# Patient Record
Sex: Female | Born: 1940 | Race: White | Hispanic: No | Marital: Married | Smoking: Former smoker
Health system: Southern US, Community
[De-identification: ages and names within clinical notes are randomized; demographics above are authoritative.]

## PROBLEM LIST (undated history)

## (undated) DIAGNOSIS — H409 Unspecified glaucoma: Secondary | ICD-10-CM

## (undated) DIAGNOSIS — K589 Irritable bowel syndrome without diarrhea: Secondary | ICD-10-CM

## (undated) DIAGNOSIS — K635 Polyp of colon: Secondary | ICD-10-CM

## (undated) DIAGNOSIS — N6001 Solitary cyst of right breast: Secondary | ICD-10-CM

## (undated) DIAGNOSIS — M810 Age-related osteoporosis without current pathological fracture: Secondary | ICD-10-CM

## (undated) DIAGNOSIS — R569 Unspecified convulsions: Secondary | ICD-10-CM

## (undated) DIAGNOSIS — I209 Angina pectoris, unspecified: Secondary | ICD-10-CM

## (undated) DIAGNOSIS — Z9889 Other specified postprocedural states: Secondary | ICD-10-CM

## (undated) DIAGNOSIS — R413 Other amnesia: Secondary | ICD-10-CM

## (undated) DIAGNOSIS — R112 Nausea with vomiting, unspecified: Secondary | ICD-10-CM

## (undated) DIAGNOSIS — K219 Gastro-esophageal reflux disease without esophagitis: Secondary | ICD-10-CM

## (undated) DIAGNOSIS — E7800 Pure hypercholesterolemia, unspecified: Secondary | ICD-10-CM

## (undated) DIAGNOSIS — Z1379 Encounter for other screening for genetic and chromosomal anomalies: Principal | ICD-10-CM

## (undated) DIAGNOSIS — Z9109 Other allergy status, other than to drugs and biological substances: Secondary | ICD-10-CM

## (undated) DIAGNOSIS — I10 Essential (primary) hypertension: Secondary | ICD-10-CM

## (undated) DIAGNOSIS — H269 Unspecified cataract: Secondary | ICD-10-CM

## (undated) HISTORY — PX: ABDOMINAL HYSTERECTOMY: SHX81

## (undated) HISTORY — PX: ELBOW FRACTURE SURGERY: SHX616

## (undated) HISTORY — PX: EYE SURGERY: SHX253

## (undated) HISTORY — DX: Encounter for other screening for genetic and chromosomal anomalies: Z13.79

## (undated) HISTORY — PX: CARDIAC CATHETERIZATION: SHX172

## (undated) HISTORY — DX: Other amnesia: R41.3

## (undated) HISTORY — PX: WRIST ARTHROSCOPY: SUR100

## (undated) HISTORY — PX: COLONOSCOPY: SHX174

## (undated) HISTORY — PX: TUBAL LIGATION: SHX77

## (undated) HISTORY — DX: Age-related osteoporosis without current pathological fracture: M81.0

## (undated) HISTORY — PX: CATARACT EXTRACTION, BILATERAL: SHX1313

## (undated) HISTORY — PX: BREAST SURGERY: SHX581

---

## 2007-11-19 ENCOUNTER — Encounter: Admission: RE | Admit: 2007-11-19 | Discharge: 2007-11-19 | Payer: Self-pay | Admitting: Internal Medicine

## 2007-11-23 ENCOUNTER — Ambulatory Visit (HOSPITAL_COMMUNITY): Admission: RE | Admit: 2007-11-23 | Discharge: 2007-11-23 | Payer: Self-pay | Admitting: Internal Medicine

## 2007-12-28 ENCOUNTER — Encounter: Admission: RE | Admit: 2007-12-28 | Discharge: 2007-12-28 | Payer: Self-pay | Admitting: Cardiovascular Disease

## 2008-01-01 ENCOUNTER — Ambulatory Visit (HOSPITAL_COMMUNITY): Admission: RE | Admit: 2008-01-01 | Discharge: 2008-01-01 | Payer: Self-pay | Admitting: Cardiovascular Disease

## 2008-12-17 ENCOUNTER — Encounter: Admission: RE | Admit: 2008-12-17 | Discharge: 2008-12-17 | Payer: Self-pay | Admitting: Internal Medicine

## 2010-04-12 ENCOUNTER — Encounter: Payer: Self-pay | Admitting: Internal Medicine

## 2010-04-12 ENCOUNTER — Encounter: Payer: Self-pay | Admitting: Cardiovascular Disease

## 2010-08-03 NOTE — Cardiovascular Report (Signed)
NAMEJAQUILLA, WOODROOF                ACCOUNT NO.:  [id number]   MEDICAL RECORD NO.:  [id number]         PATIENT TYPE:  COIB   LOCATION:                               FACILITY:  [facility]   PHYSICIAN:  Nanetta Batty, M.D.   DATE OF BIRTH:  July 15, 1940   DATE OF PROCEDURE:  DATE OF DISCHARGE:  01/01/2008                            CARDIAC CATHETERIZATION   Ms. Gilchrest is a 70 year old married white female, mother of 1,  grandmother of 2 grandchildren who had relocated from [place]  last year to [place] to be close to her family.  She is referred for  evaluation of chest pain.   Risk factor profile is positive for hypertension and dyslipidemia, as  well a strong family history.  She complained of pain, lasting  approximately once a month, radiating to her jaw and arm.  The patient  has had multiple stress tests in [place], all which were negative.  A  CT angiogram suggested heavy calcium in her distal left main/LAD and  circumflex.  She presents here for diagnostic coronary arteriography to  define her anatomy and rule out ischemic etiology.   PROCEDURE DESCRIPTION:  The patient was brought to the second floor of  [facility] in the postabsorptive state.  She was  premedicated with p.o. Valium.  Her right groin was prepped and shaved  in the usual sterile fashion.  A 1% Xylocaine was used for local  anesthesia.  A 6-French sheath was inserted into the right femoral  artery using standard Seldinger technique.  A 6-French right and left  Judkins diagnostic catheters, as well as 6-French pigtail catheter were  used for selective coronary angiography, left ventriculography, and  distal abdominal aortography.  Visipaque dye was used for the entirety  of the case.  Retrograde aortic and ventricular and pullback pressures  were recorded.   HEMODYNAMICS:  1. Aortic systolic pressure 167, diastolic pressure 66.  2. Left ventricular systolic pressure 157, end-diastolic  pressure 20.   Selective coronary angiography:  1. Left main normal.  2. LAD; LAD had a segmental 50-60% stenosis in its proximal portion.      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  The circumflex had a segmental 50% proximal stenosis.  The      ramus branch was large and widely patent.  3. Right coronary artery; this was dominant with minor irregularities      in the midportion.  4. Ventriculography; RAO left ventriculogram performed using 25 mL of      Visipaque dye at 12 mL per second.  The overall LVEF was estimated      greater than 60% without focal wall motion abnormalities.  5. Distal abdominal aortography; performed using 25 mL of Visipaque      dye at 20 mL per second.  The renal arteries widely patent.      Inferior abdominal aorta and iliac bifurcation appear free of      significant atherosclerotic changes.   IMPRESSION:  Ms. Rosenau has moderate to noncritical  coronary artery  disease.  I think that her CT angio overestimated the degree of coronary  atherosclerosis.  Continue medical therapy will be recommended.   Sheath was removed and pressures on the groin to achieve hemostasis.  The patient left the lab in stable condition.  She will remain recumbent  for 5 hours, after which time, she will be discharged home.  She will  see me back in the office in approximately 1 or 2 weeks in followup.      Nanetta Batty, M.D.  Electronically Signed     JB/MEDQ  D:  01/01/2008  T:  01/02/2008  Job:  [id number]   cc:   [facility]  [place] R. Renae Gloss, M.D.

## 2010-08-07 ENCOUNTER — Emergency Department (HOSPITAL_BASED_OUTPATIENT_CLINIC_OR_DEPARTMENT_OTHER)
Admission: EM | Admit: 2010-08-07 | Discharge: 2010-08-07 | Disposition: A | Discharge status: Home / Self Care | Payer: MEDICARE | Attending: Emergency Medicine | Admitting: Emergency Medicine

## 2010-08-07 ENCOUNTER — Emergency Department (INDEPENDENT_AMBULATORY_CARE_PROVIDER_SITE_OTHER): Payer: MEDICARE

## 2010-08-07 DIAGNOSIS — Z79899 Other long term (current) drug therapy: Secondary | ICD-10-CM | POA: Insufficient documentation

## 2010-08-07 DIAGNOSIS — S52023A Displaced fracture of olecranon process without intraarticular extension of unspecified ulna, initial encounter for closed fracture: Secondary | ICD-10-CM | POA: Insufficient documentation

## 2010-08-07 DIAGNOSIS — I10 Essential (primary) hypertension: Secondary | ICD-10-CM | POA: Insufficient documentation

## 2010-08-07 DIAGNOSIS — Y92009 Unspecified place in unspecified non-institutional (private) residence as the place of occurrence of the external cause: Secondary | ICD-10-CM | POA: Insufficient documentation

## 2010-08-07 DIAGNOSIS — W010XXA Fall on same level from slipping, tripping and stumbling without subsequent striking against object, initial encounter: Secondary | ICD-10-CM

## 2010-08-10 ENCOUNTER — Other Ambulatory Visit (HOSPITAL_COMMUNITY): Payer: Self-pay | Admitting: Orthopaedic Surgery

## 2010-08-10 ENCOUNTER — Ambulatory Visit (HOSPITAL_COMMUNITY)
Admission: RE | Admit: 2010-08-10 | Discharge: 2010-08-10 | Disposition: A | Discharge status: Home / Self Care | Payer: BC Managed Care – PPO | Source: Ambulatory Visit | Attending: Orthopaedic Surgery | Admitting: Orthopaedic Surgery

## 2010-08-10 ENCOUNTER — Encounter (HOSPITAL_COMMUNITY)
Admission: RE | Admit: 2010-08-10 | Discharge: 2010-08-10 | Disposition: A | Discharge status: Home / Self Care | Payer: BC Managed Care – PPO | Source: Ambulatory Visit | Attending: Orthopaedic Surgery | Admitting: Orthopaedic Surgery

## 2010-08-10 DIAGNOSIS — Z01818 Encounter for other preprocedural examination: Secondary | ICD-10-CM | POA: Insufficient documentation

## 2010-08-10 DIAGNOSIS — R079 Chest pain, unspecified: Secondary | ICD-10-CM | POA: Insufficient documentation

## 2010-08-10 DIAGNOSIS — S42402A Unspecified fracture of lower end of left humerus, initial encounter for closed fracture: Secondary | ICD-10-CM

## 2010-08-10 DIAGNOSIS — Z01812 Encounter for preprocedural laboratory examination: Secondary | ICD-10-CM | POA: Insufficient documentation

## 2010-08-10 LAB — BASIC METABOLIC PANEL
BUN: 16 mg/dL (ref 6–23)
CO2: 28 mEq/L (ref 19–32)
Calcium: 9.3 mg/dL (ref 8.4–10.5)
Chloride: 104 mEq/L (ref 96–112)
Creatinine, Ser: 0.74 mg/dL (ref 0.4–1.2)
GFR calc Af Amer: >60 mL/min (ref >60)
GFR calc non Af Amer: >60 mL/min (ref >60)
Glucose, Bld: 99 mg/dL (ref 70–99)
Potassium: 4.4 mEq/L (ref 3.5–5.1)
Sodium: 142 mEq/L (ref 135–145)

## 2010-08-10 LAB — CBC
HCT: 38.3 % (ref 36.0–46.0)
Hemoglobin: 12.8 g/dL (ref 12.0–15.0)
MCH: 32.2 pg (ref 26.0–34.0)
MCHC: 33.4 g/dL (ref 30.0–36.0)
MCV: 96.2 fL (ref 78.0–100.0)
Platelets: 255 10*3/uL (ref 150–400)
RBC: 3.98 MIL/uL (ref 3.87–5.11)
RDW: 11.8 % (ref 11.5–15.5)
WBC: 6.3 10*3/uL (ref 4.0–10.5)

## 2010-08-10 LAB — URINALYSIS, ROUTINE W REFLEX MICROSCOPIC
Bilirubin Urine: NEGATIVE
Glucose, UA: NEGATIVE mg/dL
Hgb urine dipstick: NEGATIVE
Ketones, ur: NEGATIVE mg/dL
Nitrite: NEGATIVE
Protein, ur: NEGATIVE mg/dL
Specific Gravity, Urine: 1.025 (ref 1.005–1.030)
Urobilinogen, UA: 0.2 mg/dL (ref 0.0–1.0)
pH: 6 (ref 5.0–8.0)

## 2010-08-10 LAB — SURGICAL PCR SCREEN
MRSA, PCR: NEGATIVE
Staphylococcus aureus: POSITIVE — AB

## 2010-08-11 ENCOUNTER — Ambulatory Visit (HOSPITAL_COMMUNITY): Payer: BC Managed Care – PPO

## 2010-08-11 ENCOUNTER — Observation Stay (HOSPITAL_COMMUNITY)
Admission: RE | Admit: 2010-08-11 | Discharge: 2010-08-12 | Disposition: A | Discharge status: Home / Self Care | Payer: BC Managed Care – PPO | Source: Ambulatory Visit | Attending: Orthopaedic Surgery | Admitting: Orthopaedic Surgery

## 2010-08-11 DIAGNOSIS — W19XXXA Unspecified fall, initial encounter: Secondary | ICD-10-CM | POA: Insufficient documentation

## 2010-08-11 DIAGNOSIS — Z01812 Encounter for preprocedural laboratory examination: Secondary | ICD-10-CM | POA: Insufficient documentation

## 2010-08-11 DIAGNOSIS — I10 Essential (primary) hypertension: Secondary | ICD-10-CM | POA: Insufficient documentation

## 2010-08-11 DIAGNOSIS — H409 Unspecified glaucoma: Secondary | ICD-10-CM | POA: Insufficient documentation

## 2010-08-11 DIAGNOSIS — Z01810 Encounter for preprocedural cardiovascular examination: Secondary | ICD-10-CM | POA: Insufficient documentation

## 2010-08-11 DIAGNOSIS — S52123A Displaced fracture of head of unspecified radius, initial encounter for closed fracture: Secondary | ICD-10-CM | POA: Insufficient documentation

## 2010-08-11 DIAGNOSIS — S52023A Displaced fracture of olecranon process without intraarticular extension of unspecified ulna, initial encounter for closed fracture: Principal | ICD-10-CM | POA: Insufficient documentation

## 2010-08-11 DIAGNOSIS — Z01818 Encounter for other preprocedural examination: Secondary | ICD-10-CM | POA: Insufficient documentation

## 2010-08-19 NOTE — Op Note (Signed)
NAME:  Sandra, Wheeler                ACCOUNT NO.:  [id number]  MEDICAL RECORD NO.:  [id number]           PATIENT TYPE:  O  LOCATION:  5001                         FACILITY:  [facility]  PHYSICIAN:  Mark C. Ophelia Charter, M.D.    DATE OF BIRTH:  Jul 31, 1940  DATE OF PROCEDURE:  08/11/2010 DATE OF DISCHARGE:                              OPERATIVE REPORT   PREOPERATIVE DIAGNOSIS:  Fracture dislocation, left elbow with comminuted olecranon fracture.  POSTOPERATIVE DIAGNOSIS:  Fracture dislocation, left elbow with comminuted olecranon fracture.  PROCEDURE:  Open reduction and internal fixation, left olecranon with Acumed plate, Steinmann pins, and tension band wiring.  SURGEON:  Mark C. Ophelia Charter, MD  TOURNIQUET TIME:  2 hours 6 minutes x250.  ANESTHESIA:  Preoperative regional block plus IV sedation.  This 70 year old female few days ago had a fall, landed on olecranon with comminuted fracture dislocation with the coracoid fracture anteriorly and a fracture of the radial head without significant angulation or displacement.  Anterior coracoid fragment was just few millimeters.  Olecranon was displaced 2 cm.  PROCEDURE:  After induction of regional block IV sedation, proximal arm tourniquet with split off, standard prep and draping was performed with impervious stockinette, Coban, prepping with DuraPrep from the wrist to the tourniquet and folded towel, towel clip, and extremity sheets and drapes.  Arm was wrapped with an Esmarch after a time-out procedure was completed and a posterior incision was made with a bolster underneath the upper arm.  Fracture fragments were identified.  Joint was washed out.  Olecranon fragment was reduced, held with K-wires from distal to proximal, and checked under fluoroscopy with adequate position.  Small Acumed plate was selected.  The home run screw was placed after distal nonlocking screw was selected.  Plate was advanced, tightened down fragment, home run screw  inserted and then the 4 clusters of small locking screws were inserted.  C-arm was brought in as the elbow was being flexed up to check position and alignment.  The olecranon fragment broke loose from the home run screw and was displaced and the elbow subluxed with the dislocation of the humerus anterior from the ulna. Distraction and reduction was performed.  Plate was removed.  Plate with a longer flange was selected.  Again, reduction was performed.  Towel clips were used on the side.  There continued to be problems with the elbow subluxing anteriorly and finally a series of 4 K-wires were placed holding everything together.  K-wires were placed from proximal to distal across the fracture and then the home run screws and regular locking and nonlocking screws were placed distally in the plate into the ulna.  A 20-gauge figure-of-eight stainless steel wire was hooked through a hole in the plate and then hooked around the proximal K-wires for tension band fact.  This was checked under fluoroscopy and finally there was good position and alignment and reduction.  Additional screws were placed.  The 4 cluster of proximal screws with the guide had only 2 screws placed since this was distal to the fracture and the olecranon was comminuted.  The proximal piece was soft and the  tension band with pins plus 2 proximal locking screws held it reduced.  AP and lateral spot films were taken, fluoroscopy to make sure everything looked good. The big C-arm was brought in.  Initially, a small C-arm was used and the big C-arm gave better visualization.  After irrigation, layered closure with 2-0 Vicryl and subcutaneous tissue, skin staple closure.  Xeroform 4x4s, Webril, and then application of a fiberglass long arm cast was performed and spot fluoro pictures were taken again to confirm that the elbow was reduced and was in satisfactory position with reduction of the elbow.  Elbow was at 90 degrees, forearm  in neutral, and radial head was reduced.  The patient tolerated the procedure well and was transferred to the recovery room in stable condition.  Hand dressing was applied to the fingers with Webril 2-inch and 2-inch Ace wrap to help with finger swelling.  Time-out procedure was completed at the end of the case. Surgery was as planned.     Mark C. Ophelia Charter, M.D.     MCY/MEDQ  D:  08/11/2010  T:  08/12/2010  Job:  [id number]  Electronically Signed by Annell Greening M.D. on 08/19/2010 95:62:13 PM

## 2016-03-28 ENCOUNTER — Other Ambulatory Visit: Payer: Self-pay | Admitting: Obstetrics & Gynecology

## 2016-03-30 NOTE — Patient Instructions (Addendum)
Your procedure is scheduled on:  Friday, Jan. 19, 2018  Enter through the [employer] [facility] at:  7:00 AM  Pick up the phone at the desk and dial [phone].  Call this number if you have problems the morning of surgery: [phone].  Remember: Do NOT eat food or drink after:  Midnight Thursday, Jan. 18, 2018  Take these medicines the morning of surgery with a SIP OF WATER:  Cetirizine, Viberzi  Stop ALL herbal medications, Aspirin, and Flaxseed at this time  Do NOT smoke the day of surgery.  Do NOT wear jewelry (body piercing), metal hair clips/bobby pins, make-up, or nail polish. Do NOT wear lotions, powders, or perfumes.  You may wear deodorant. Do NOT shave for 48 hours prior to surgery. Do NOT bring valuables to the hospital. Contacts, dentures, or bridgework may not be worn into surgery.  Leave suitcase in car.  After surgery it may be brought to your room.  For patients admitted to the hospital, checkout time is 11:00 AM the day of discharge.  Bring a copy of your healthcare power of attorney and living will documents.

## 2016-03-31 ENCOUNTER — Ambulatory Visit (HOSPITAL_COMMUNITY)
Admission: RE | Admit: 2016-03-31 | Discharge: 2016-03-31 | Disposition: A | Discharge status: Home / Self Care | Payer: Medicare Other | Source: Ambulatory Visit | Attending: Obstetrics & Gynecology | Admitting: Obstetrics & Gynecology

## 2016-03-31 ENCOUNTER — Encounter (HOSPITAL_COMMUNITY): Payer: Self-pay

## 2016-03-31 ENCOUNTER — Encounter (HOSPITAL_COMMUNITY)
Admission: RE | Admit: 2016-03-31 | Discharge: 2016-03-31 | Disposition: A | Discharge status: Home / Self Care | Payer: Medicare Other | Source: Ambulatory Visit | Attending: Obstetrics & Gynecology | Admitting: Obstetrics & Gynecology

## 2016-03-31 ENCOUNTER — Other Ambulatory Visit: Payer: Self-pay

## 2016-03-31 DIAGNOSIS — J8410 Pulmonary fibrosis, unspecified: Secondary | ICD-10-CM | POA: Diagnosis not present

## 2016-03-31 DIAGNOSIS — I10 Essential (primary) hypertension: Secondary | ICD-10-CM | POA: Insufficient documentation

## 2016-03-31 DIAGNOSIS — Z419 Encounter for procedure for purposes other than remedying health state, unspecified: Secondary | ICD-10-CM

## 2016-03-31 HISTORY — DX: Angina pectoris, unspecified: I20.9

## 2016-03-31 HISTORY — DX: Essential (primary) hypertension: I10

## 2016-03-31 HISTORY — DX: Solitary cyst of right breast: N60.01

## 2016-03-31 HISTORY — DX: Irritable bowel syndrome, unspecified: K58.9

## 2016-03-31 HISTORY — DX: Unspecified glaucoma: H40.9

## 2016-03-31 HISTORY — DX: Other allergy status, other than to drugs and biological substances: Z91.09

## 2016-03-31 HISTORY — DX: Unspecified cataract: H26.9

## 2016-03-31 HISTORY — DX: Polyp of colon: K63.5

## 2016-03-31 HISTORY — DX: Pure hypercholesterolemia, unspecified: E78.00

## 2016-03-31 HISTORY — DX: Gastro-esophageal reflux disease without esophagitis: K21.9

## 2016-03-31 HISTORY — DX: Unspecified convulsions: R56.9

## 2016-03-31 HISTORY — PX: DENTAL SURGERY: SHX609

## 2016-03-31 LAB — CBC
HCT: 38.5 % (ref 36.0–46.0)
Hemoglobin: 13.1 g/dL (ref 12.0–15.0)
MCH: 32.6 pg (ref 26.0–34.0)
MCHC: 34 g/dL (ref 30.0–36.0)
MCV: 95.8 fL (ref 78.0–100.0)
Platelets: 276 10*3/uL (ref 150–400)
RBC: 4.02 MIL/uL (ref 3.87–5.11)
RDW: 12.2 % (ref 11.5–15.5)
WBC: 4.4 10*3/uL (ref 4.0–10.5)

## 2016-03-31 LAB — COMPREHENSIVE METABOLIC PANEL
ALT: 22 U/L (ref 14–54)
AST: 22 U/L (ref 15–41)
Albumin: 4 g/dL (ref 3.5–5.0)
Alkaline Phosphatase: 51 U/L (ref 38–126)
Anion gap: 5 (ref 5–15)
BUN: 19 mg/dL (ref 6–20)
CO2: 28 mmol/L (ref 22–32)
Calcium: 8.7 mg/dL — ABNORMAL LOW (ref 8.9–10.3)
Chloride: 107 mmol/L (ref 101–111)
Creatinine, Ser: 0.59 mg/dL (ref 0.44–1.00)
GFR calc Af Amer: >60 mL/min (ref >60)
GFR calc non Af Amer: >60 mL/min (ref >60)
Glucose, Bld: 95 mg/dL (ref 65–99)
Potassium: 4.2 mmol/L (ref 3.5–5.1)
Sodium: 140 mmol/L (ref 135–145)
Total Bilirubin: 0.5 mg/dL (ref 0.3–1.2)
Total Protein: 7 g/dL (ref 6.5–8.1)

## 2016-03-31 LAB — ABO/RH: ABO/RH(D): O POS

## 2016-03-31 LAB — TYPE AND SCREEN
ABO/RH(D): O POS
Antibody Screen: NEGATIVE

## 2016-03-31 NOTE — Pre-Procedure Instructions (Signed)
Dr. Smith Robert reviewed EKG no new orders received at this time.

## 2016-04-08 ENCOUNTER — Ambulatory Visit (HOSPITAL_COMMUNITY)
Admission: RE | Admit: 2016-04-08 | Discharge: 2016-04-09 | Disposition: A | Discharge status: Home / Self Care | Payer: Medicare Other | Source: Ambulatory Visit | Attending: Obstetrics & Gynecology | Admitting: Obstetrics & Gynecology

## 2016-04-08 ENCOUNTER — Ambulatory Visit (HOSPITAL_COMMUNITY): Payer: Medicare Other | Admitting: Certified Registered Nurse Anesthetist

## 2016-04-08 ENCOUNTER — Encounter (HOSPITAL_COMMUNITY): Payer: Self-pay

## 2016-04-08 ENCOUNTER — Encounter (HOSPITAL_COMMUNITY)
Admission: RE | Disposition: A | Discharge status: Home / Self Care | Payer: Self-pay | Source: Ambulatory Visit | Attending: Obstetrics & Gynecology

## 2016-04-08 DIAGNOSIS — N813 Complete uterovaginal prolapse: Secondary | ICD-10-CM | POA: Diagnosis not present

## 2016-04-08 DIAGNOSIS — I10 Essential (primary) hypertension: Secondary | ICD-10-CM | POA: Diagnosis not present

## 2016-04-08 DIAGNOSIS — N393 Stress incontinence (female) (male): Secondary | ICD-10-CM | POA: Diagnosis present

## 2016-04-08 DIAGNOSIS — K219 Gastro-esophageal reflux disease without esophagitis: Secondary | ICD-10-CM | POA: Insufficient documentation

## 2016-04-08 DIAGNOSIS — H409 Unspecified glaucoma: Secondary | ICD-10-CM | POA: Insufficient documentation

## 2016-04-08 DIAGNOSIS — E7800 Pure hypercholesterolemia, unspecified: Secondary | ICD-10-CM | POA: Diagnosis not present

## 2016-04-08 DIAGNOSIS — Z7982 Long term (current) use of aspirin: Secondary | ICD-10-CM | POA: Diagnosis not present

## 2016-04-08 DIAGNOSIS — D259 Leiomyoma of uterus, unspecified: Secondary | ICD-10-CM | POA: Diagnosis not present

## 2016-04-08 DIAGNOSIS — Z9889 Other specified postprocedural states: Secondary | ICD-10-CM

## 2016-04-08 HISTORY — PX: CYSTOSCOPY: SHX5120

## 2016-04-08 HISTORY — PX: BLADDER SUSPENSION: SHX72

## 2016-04-08 HISTORY — DX: Other specified postprocedural states: Z98.890

## 2016-04-08 HISTORY — DX: Other specified postprocedural states: R11.2

## 2016-04-08 HISTORY — PX: ROBOTIC ASSISTED TOTAL HYSTERECTOMY WITH BILATERAL SALPINGO OOPHERECTOMY: SHX6086

## 2016-04-08 SURGERY — ROBOTIC ASSISTED TOTAL HYSTERECTOMY WITH BILATERAL SALPINGO OOPHORECTOMY
Anesthesia: General | Site: Vagina

## 2016-04-08 MED ORDER — PROPOFOL 10 MG/ML IV BOLUS
INTRAVENOUS | Status: DC | PRN
  Administered 2016-04-08: 100 mg via INTRAVENOUS

## 2016-04-08 MED ORDER — SUGAMMADEX SODIUM 200 MG/2ML IV SOLN
INTRAVENOUS | Status: DC | PRN
  Administered 2016-04-08: 133 mg via INTRAVENOUS

## 2016-04-08 MED ORDER — DIPHENHYDRAMINE HCL 50 MG/ML IJ SOLN
12.5000 mg | Freq: Once | INTRAMUSCULAR | Status: AC
  Administered 2016-04-08: 12.5 mg via INTRAVENOUS

## 2016-04-08 MED ORDER — ROPIVACAINE HCL 5 MG/ML IJ SOLN
INTRAMUSCULAR | Status: AC
  Filled 2016-04-08: qty 30

## 2016-04-08 MED ORDER — SCOPOLAMINE 1 MG/3DAYS TD PT72: 1.0000 | MEDICATED_PATCH | Freq: Once | TRANSDERMAL | Status: DC

## 2016-04-08 MED ORDER — FENTANYL CITRATE (PF) 100 MCG/2ML IJ SOLN: 25.0000 ug | INTRAMUSCULAR | Status: DC | PRN

## 2016-04-08 MED ORDER — ONDANSETRON HCL 4 MG/2ML IJ SOLN
INTRAMUSCULAR | Status: AC
  Filled 2016-04-08: qty 2

## 2016-04-08 MED ORDER — LIDOCAINE HCL (CARDIAC) 20 MG/ML IV SOLN
INTRAVENOUS | Status: AC
  Filled 2016-04-08: qty 5

## 2016-04-08 MED ORDER — LOSARTAN POTASSIUM 50 MG PO TABS
50.0000 mg | ORAL_TABLET | Freq: Every evening | ORAL | Status: DC
  Administered 2016-04-08: 50 mg via ORAL
  Filled 2016-04-08: qty 1

## 2016-04-08 MED ORDER — ALPRAZOLAM 0.25 MG PO TABS
0.1250 mg | ORAL_TABLET | Freq: Every evening | ORAL | Status: DC | PRN
  Administered 2016-04-08: 0.125 mg via ORAL
  Filled 2016-04-08 (×2): qty 1

## 2016-04-08 MED ORDER — ROCURONIUM BROMIDE 100 MG/10ML IV SOLN
INTRAVENOUS | Status: DC | PRN
  Administered 2016-04-08: 10 mg via INTRAVENOUS
  Administered 2016-04-08: 40 mg via INTRAVENOUS
  Administered 2016-04-08: 10 mg via INTRAVENOUS

## 2016-04-08 MED ORDER — STERILE WATER FOR IRRIGATION IR SOLN
Status: DC | PRN
  Administered 2016-04-08: 1000 mL via INTRAVESICAL

## 2016-04-08 MED ORDER — PRAVASTATIN SODIUM 20 MG PO TABS
20.0000 mg | ORAL_TABLET | Freq: Every evening | ORAL | Status: DC
  Administered 2016-04-08: 20 mg via ORAL
  Filled 2016-04-08: qty 1

## 2016-04-08 MED ORDER — ROCURONIUM BROMIDE 100 MG/10ML IV SOLN
INTRAVENOUS | Status: AC
  Filled 2016-04-08: qty 1

## 2016-04-08 MED ORDER — MIDAZOLAM HCL 2 MG/2ML IJ SOLN
INTRAMUSCULAR | Status: DC | PRN
  Administered 2016-04-08: 1 mg via INTRAVENOUS

## 2016-04-08 MED ORDER — CEFAZOLIN SODIUM-DEXTROSE 2-4 GM/100ML-% IV SOLN
2.0000 g | INTRAVENOUS | Status: AC
  Administered 2016-04-08: 2 g via INTRAVENOUS

## 2016-04-08 MED ORDER — HYDROMORPHONE HCL 1 MG/ML IJ SOLN
0.5000 mg | INTRAMUSCULAR | Status: DC | PRN
Start: 2016-04-08 — End: 2016-04-09

## 2016-04-08 MED ORDER — LIDOCAINE HCL (PF) 1 % IJ SOLN
INTRAMUSCULAR | Status: DC | PRN
  Administered 2016-04-08: 20 mL

## 2016-04-08 MED ORDER — LABETALOL HCL 5 MG/ML IV SOLN
INTRAVENOUS | Status: AC
  Filled 2016-04-08: qty 4

## 2016-04-08 MED ORDER — FENTANYL CITRATE (PF) 100 MCG/2ML IJ SOLN
INTRAMUSCULAR | Status: DC | PRN
  Administered 2016-04-08 (×5): 50 ug via INTRAVENOUS
  Administered 2016-04-08: 100 ug via INTRAVENOUS

## 2016-04-08 MED ORDER — ESTRADIOL 0.1 MG/GM VA CREA
TOPICAL_CREAM | VAGINAL | Status: AC
  Filled 2016-04-08: qty 42.5

## 2016-04-08 MED ORDER — KETOROLAC TROMETHAMINE 30 MG/ML IJ SOLN
INTRAMUSCULAR | Status: AC
  Filled 2016-04-08: qty 1

## 2016-04-08 MED ORDER — LIDOCAINE HCL 1 % IJ SOLN
INTRAMUSCULAR | Status: AC
  Filled 2016-04-08: qty 20

## 2016-04-08 MED ORDER — MIDAZOLAM HCL 2 MG/2ML IJ SOLN
INTRAMUSCULAR | Status: AC
  Filled 2016-04-08: qty 2

## 2016-04-08 MED ORDER — METHYLENE BLUE 0.5 % INJ SOLN
INTRAVENOUS | Status: DC | PRN
  Administered 2016-04-08: 5 mL via INTRAVENOUS

## 2016-04-08 MED ORDER — PROPOFOL 10 MG/ML IV BOLUS
INTRAVENOUS | Status: AC
  Filled 2016-04-08: qty 20

## 2016-04-08 MED ORDER — BUPIVACAINE HCL (PF) 0.25 % IJ SOLN: INTRAMUSCULAR | Status: DC | PRN

## 2016-04-08 MED ORDER — LACTATED RINGERS IV SOLN
INTRAVENOUS | Status: DC
  Administered 2016-04-08: 20:00:00 via INTRAVENOUS

## 2016-04-08 MED ORDER — LABETALOL HCL 5 MG/ML IV SOLN
INTRAVENOUS | Status: DC | PRN
  Administered 2016-04-08 (×2): 5 mg via INTRAVENOUS

## 2016-04-08 MED ORDER — DEXAMETHASONE SODIUM PHOSPHATE 4 MG/ML IJ SOLN
INTRAMUSCULAR | Status: AC
  Filled 2016-04-08: qty 1

## 2016-04-08 MED ORDER — ACETAMINOPHEN 10 MG/ML IV SOLN
1000.0000 mg | Freq: Once | INTRAVENOUS | Status: AC
  Administered 2016-04-08: 1000 mg via INTRAVENOUS
  Filled 2016-04-08: qty 100

## 2016-04-08 MED ORDER — BUPIVACAINE HCL (PF) 0.5 % IJ SOLN
INTRAMUSCULAR | Status: AC
  Filled 2016-04-08: qty 30

## 2016-04-08 MED ORDER — LIDOCAINE HCL (CARDIAC) 20 MG/ML IV SOLN
INTRAVENOUS | Status: DC | PRN
  Administered 2016-04-08: 60 mg via INTRAVENOUS

## 2016-04-08 MED ORDER — EPHEDRINE 5 MG/ML INJ
INTRAVENOUS | Status: AC
  Filled 2016-04-08: qty 10

## 2016-04-08 MED ORDER — BUPIVACAINE HCL (PF) 0.5 % IJ SOLN
INTRAMUSCULAR | Status: DC | PRN
  Administered 2016-04-08: 17 mL

## 2016-04-08 MED ORDER — SUGAMMADEX SODIUM 200 MG/2ML IV SOLN
INTRAVENOUS | Status: AC
  Filled 2016-04-08: qty 2

## 2016-04-08 MED ORDER — ONDANSETRON HCL 4 MG/2ML IJ SOLN
INTRAMUSCULAR | Status: DC | PRN
  Administered 2016-04-08: 4 mg via INTRAVENOUS

## 2016-04-08 MED ORDER — LACTATED RINGERS IV SOLN
INTRAVENOUS | Status: DC
  Administered 2016-04-08 (×3): via INTRAVENOUS

## 2016-04-08 MED ORDER — IBUPROFEN 600 MG PO TABS
600.0000 mg | ORAL_TABLET | Freq: Four times a day (QID) | ORAL | Status: DC | PRN
  Administered 2016-04-09: 600 mg via ORAL
  Filled 2016-04-08: qty 1

## 2016-04-08 MED ORDER — FENTANYL CITRATE (PF) 250 MCG/5ML IJ SOLN
INTRAMUSCULAR | Status: AC
  Filled 2016-04-08: qty 5

## 2016-04-08 MED ORDER — OXYCODONE-ACETAMINOPHEN 5-325 MG PO TABS
1.0000 | ORAL_TABLET | ORAL | Status: DC | PRN
  Administered 2016-04-08 (×2): 1 via ORAL
  Filled 2016-04-08 (×3): qty 1

## 2016-04-08 MED ORDER — DEXAMETHASONE SODIUM PHOSPHATE 10 MG/ML IJ SOLN
INTRAMUSCULAR | Status: DC | PRN
  Administered 2016-04-08: 4 mg via INTRAVENOUS

## 2016-04-08 MED ORDER — DIPHENHYDRAMINE HCL 50 MG/ML IJ SOLN
INTRAMUSCULAR | Status: AC
  Administered 2016-04-08: 12.5 mg via INTRAVENOUS
  Filled 2016-04-08: qty 1

## 2016-04-08 MED ORDER — EPHEDRINE SULFATE 50 MG/ML IJ SOLN
INTRAMUSCULAR | Status: DC | PRN
  Administered 2016-04-08: 5 mg via INTRAVENOUS

## 2016-04-08 MED ORDER — METHYLENE BLUE 0.5 % INJ SOLN
INTRAVENOUS | Status: AC
  Filled 2016-04-08: qty 10

## 2016-04-08 MED ORDER — ELUXADOLINE 75 MG PO TABS: 37.5000 mg | ORAL_TABLET | Freq: Every day | ORAL | Status: DC

## 2016-04-08 SURGICAL SUPPLY — 85 items
BARRIER ADHS 3X4 INTERCEED (GAUZE/BANDAGES/DRESSINGS) IMPLANT
BLADE SURG 15 STRL LF C SS BP (BLADE) ×4 IMPLANT
BLADE SURG 15 STRL SS (BLADE) ×1
CANISTER SUCT 3000ML (MISCELLANEOUS) ×5 IMPLANT
CATH FOLEY 2WAY SLVR  5CC 18FR (CATHETERS)
CATH FOLEY 2WAY SLVR 5CC 18FR (CATHETERS) IMPLANT
CATH FOLEY 3WAY  5CC 16FR (CATHETERS) ×1
CATH FOLEY 3WAY 5CC 16FR (CATHETERS) ×4 IMPLANT
CLOTH BEACON ORANGE TIMEOUT ST (SAFETY) ×5 IMPLANT
CONT PATH 16OZ SNAP LID 3702 (MISCELLANEOUS) ×5 IMPLANT
COVER BACK TABLE 60X90IN (DRAPES) ×10 IMPLANT
COVER TIP SHEARS 8 DVNC (MISCELLANEOUS) ×4 IMPLANT
COVER TIP SHEARS 8MM DA VINCI (MISCELLANEOUS) ×1
DECANTER SPIKE VIAL GLASS SM (MISCELLANEOUS) ×15 IMPLANT
DEFOGGER SCOPE WARMER CLEARIFY (MISCELLANEOUS) ×5 IMPLANT
DERMABOND ADVANCED (GAUZE/BANDAGES/DRESSINGS) ×3
DERMABOND ADVANCED .7 DNX12 (GAUZE/BANDAGES/DRESSINGS) ×12 IMPLANT
DEVICE CAPIO SLIM SINGLE (INSTRUMENTS) IMPLANT
DRSG OPSITE POSTOP 3X4 (GAUZE/BANDAGES/DRESSINGS) ×5 IMPLANT
DURAPREP 26ML APPLICATOR (WOUND CARE) ×10 IMPLANT
ELECT REM PT RETURN 9FT ADLT (ELECTROSURGICAL) ×5
ELECTRODE REM PT RTRN 9FT ADLT (ELECTROSURGICAL) ×4 IMPLANT
GAUZE PACKING 1/2X5YD (GAUZE/BANDAGES/DRESSINGS) IMPLANT
GAUZE PACKING IODOFORM 2 (PACKING) IMPLANT
GAUZE VASELINE 3X9 (GAUZE/BANDAGES/DRESSINGS) IMPLANT
GLOVE BIO SURGEON STRL SZ 6.5 (GLOVE) ×15 IMPLANT
GLOVE BIOGEL PI IND STRL 6.5 (GLOVE) ×4 IMPLANT
GLOVE BIOGEL PI IND STRL 7.0 (GLOVE) ×20 IMPLANT
GLOVE BIOGEL PI INDICATOR 6.5 (GLOVE) ×1
GLOVE BIOGEL PI INDICATOR 7.0 (GLOVE) ×5
GOWN STRL REUS W/TWL LRG LVL3 (GOWN DISPOSABLE) ×20 IMPLANT
KIT ACCESSORY DA VINCI DISP (KITS) ×1
KIT ACCESSORY DVNC DISP (KITS) ×4 IMPLANT
LEGGING LITHOTOMY PAIR STRL (DRAPES) ×5 IMPLANT
MANIPULATOR ADVINCU DEL 3.0 PL (MISCELLANEOUS) IMPLANT
MANIPULATOR ADVINCU DEL 3.5 PL (MISCELLANEOUS) IMPLANT
MANIPULATOR ADVINCU DEL 4.0 PL (MISCELLANEOUS) IMPLANT
NEEDLE HYPO 22GX1.5 SAFETY (NEEDLE) ×5 IMPLANT
NEEDLE SPNL 20GX3.5 QUINCKE YW (NEEDLE) ×5 IMPLANT
NEEDLE SPNL 22GX3.5 QUINCKE BK (NEEDLE) IMPLANT
NS IRRIG 1000ML POUR BTL (IV SOLUTION) ×5 IMPLANT
OCCLUDER COLPOPNEUMO (BALLOONS) ×5 IMPLANT
PACK ROBOT WH (CUSTOM PROCEDURE TRAY) ×5 IMPLANT
PACK ROBOTIC GOWN (GOWN DISPOSABLE) ×5 IMPLANT
PACK TRENDGUARD 450 HYBRID PRO (MISCELLANEOUS) ×4 IMPLANT
PACK TRENDGUARD 600 HYBRD PROC (MISCELLANEOUS) IMPLANT
PACK VAGINAL WOMENS (CUSTOM PROCEDURE TRAY) IMPLANT
PAD PREP 24X48 CUFFED NSTRL (MISCELLANEOUS) ×5 IMPLANT
PLUG CATH AND CAP STER (CATHETERS) ×5 IMPLANT
PROTECTOR NERVE ULNAR (MISCELLANEOUS) ×10 IMPLANT
SET CYSTO W/LG BORE CLAMP LF (SET/KITS/TRAYS/PACK) ×10 IMPLANT
SET IRRIG TUBING LAPAROSCOPIC (IRRIGATION / IRRIGATOR) ×5 IMPLANT
SET TRI-LUMEN FLTR TB AIRSEAL (TUBING) ×5 IMPLANT
SLING TVT EXACT (Sling) ×5 IMPLANT
SUT CAPIO ETHIBPND (SUTURE) IMPLANT
SUT ETHIBOND 0 (SUTURE) ×10 IMPLANT
SUT VIC AB 0 CT1 27 (SUTURE)
SUT VIC AB 0 CT1 27XBRD ANBCTR (SUTURE) IMPLANT
SUT VIC AB 2-0 SH 27 (SUTURE) ×2
SUT VIC AB 2-0 SH 27XBRD (SUTURE) ×8 IMPLANT
SUT VIC AB 2-0 UR5 27 (SUTURE) IMPLANT
SUT VIC AB 2-0 UR6 27 (SUTURE) IMPLANT
SUT VIC AB 4-0 PS2 27 (SUTURE) ×10 IMPLANT
SUT VICRYL 0 UR6 27IN ABS (SUTURE) ×10 IMPLANT
SUT VLOC 180 0 9IN  GS21 (SUTURE) ×1
SUT VLOC 180 0 9IN GS21 (SUTURE) ×4 IMPLANT
SUT VLOC 180 2-0 6IN GS21 (SUTURE) ×10 IMPLANT
SYR 50ML LL SCALE MARK (SYRINGE) ×5 IMPLANT
SYRINGE 10CC LL (SYRINGE) IMPLANT
SYSTEM CONVERTIBLE TROCAR (TROCAR) ×5 IMPLANT
TIP RUMI ORANGE 6.7MMX12CM (TIP) IMPLANT
TIP UTERINE 5.1X6CM LAV DISP (MISCELLANEOUS) IMPLANT
TIP UTERINE 6.7X10CM GRN DISP (MISCELLANEOUS) IMPLANT
TIP UTERINE 6.7X6CM WHT DISP (MISCELLANEOUS) IMPLANT
TIP UTERINE 6.7X8CM BLUE DISP (MISCELLANEOUS) ×5 IMPLANT
TOWEL OR 17X24 6PK STRL BLUE (TOWEL DISPOSABLE) ×15 IMPLANT
TRAY FOLEY CATH SILVER 14FR (SET/KITS/TRAYS/PACK) ×5 IMPLANT
TRAY FOLEY CATH SILVER 16FR (SET/KITS/TRAYS/PACK) IMPLANT
TRENDGUARD 450 HYBRID PRO PACK (MISCELLANEOUS) ×5
TRENDGUARD 600 HYBRID PROC PK (MISCELLANEOUS)
TROCAR 12M 150ML BLUNT (TROCAR) ×5 IMPLANT
TROCAR DISP BLADELESS 8 DVNC (TROCAR) ×4 IMPLANT
TROCAR DISP BLADELESS 8MM (TROCAR) ×1
TROCAR PORT AIRSEAL 8X120 (TROCAR) ×5 IMPLANT
WATER STERILE IRR 1000ML POUR (IV SOLUTION) ×10 IMPLANT

## 2016-04-08 NOTE — Anesthesia Postprocedure Evaluation (Signed)
Anesthesia Post Note  Patient: Sandra Wheeler  Procedure(s) Performed: Procedure(s) (LRB): ROBOTIC ASSISTED TOTAL HYSTERECTOMY WITH BILATERAL SALPINGO OOPHORECTOMY/Uterosacral Ligament Suspension (Bilateral) TRANSVAGINAL TAPE (TVT) PROCEDURE (N/A) CYSTOSCOPY (N/A)  Patient location during evaluation: [facility] Anesthesia Type: General Level of consciousness: awake, oriented and awake and alert Pain management: pain level controlled Vital Signs Assessment: post-procedure vital signs reviewed and stable Respiratory status: spontaneous breathing Cardiovascular status: stable Postop Assessment: no signs of nausea or vomiting and adequate PO intake Anesthetic complications: no        Last Vitals:  Vitals:   04/08/16 1400 04/08/16 1415  BP: (!) 163/67 (!) 149/63  Pulse: 76 70  Resp: 13 16  Temp: 36.3 C 36.4 C    Last Pain:  Vitals:   04/08/16 1415  TempSrc: Oral   Pain Goal: Patients Stated Pain Goal: 3 (04/08/16 1233)               France Ravens Hristova

## 2016-04-08 NOTE — Progress Notes (Signed)
Instructions given to pt and husband about ordering clear liquids first

## 2016-04-08 NOTE — H&P (Signed)
NIOMA PRUSHA is an 76 y.o. female G1P1 married  RP:  Uterine prolapse/Cystocele  Pertinent Gynecological History:  Contraception: tubal ligation/Menopause Blood transfusions: none Sexually transmitted diseases: no past history Previous GYN Procedures: BT/S  Last mammogram: normal  Last pap: normal  OB History: G1P1 SVD   Menstrual History:  No LMP recorded. Patient is postmenopausal.    Past Medical History:  Diagnosis Date  . Anginal pain ([place])   . Breast cyst, right   . Cataract    unsure of which eye  . Colon polyps   . Environmental allergies   . GERD (gastroesophageal reflux disease)   . Glaucoma   . High cholesterol   . Hypertension   . IBS (irritable bowel syndrome)   . PONV (postoperative nausea and vomiting)   . 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 BREAST SURGERY     cyst removal  . CARDIAC CATHETERIZATION    . COLONOSCOPY    . DENTAL SURGERY  03/31/2016  . ELBOW FRACTURE SURGERY    . EYE SURGERY Left    blood clot on retina  . TUBAL LIGATION    . WRIST ARTHROSCOPY      History reviewed. No pertinent family history.  Social History:  reports that she has never smoked. She has never used smokeless tobacco. She reports that she drinks alcohol. She reports that she does not use drugs.  Allergies:  Allergies  Allergen Reactions  . Codeine Other (See Comments)    Knocked her out    Prescriptions Prior to Admission  Medication Sig Dispense Refill Last Dose  . Acetaminophen (TYLENOL PO) Take 1 tablet by mouth daily as needed (pain / headache).   04/07/2016 at Unknown time  . ALPRAZolam (XANAX) 0.25 MG tablet Take 0.125 mg by mouth at bedtime as needed for sleep.   04/07/2016 at Unknown time  . aspirin 81 MG chewable tablet Chew 81 mg by mouth every other day. Will stop prior to procedure   Past Month at Unknown time  . b complex vitamins tablet Take 1 tablet by mouth  daily.   04/07/2016 at Unknown time  . BIOTIN PO Take 1 tablet by mouth daily.   04/07/2016 at Unknown time  . Cetirizine HCl (KLS ALLER-TEC PO) Take 1 tablet by mouth daily.   04/07/2016 at Unknown time  . Cholecalciferol ([employer] D3) 2000 units CAPS Take 2,000 Units by mouth daily.   04/07/2016 at Unknown time  . Coenzyme Q10 (COQ10) 100 MG CAPS Take 100 mg by mouth every evening.   04/07/2016 at Unknown time  . Eluxadoline (VIBERZI) 75 MG TABS Take 37.5 mg by mouth daily. Takes 0.5 tablet   04/07/2016 at Unknown time  . Flaxseed, Linseed, (FLAX SEED OIL PO) Take 1 tablet by mouth daily. Flaxseed oil 14 mg   04/07/2016 at Unknown time  . losartan (COZAAR) 50 MG tablet Take 50 mg by mouth every evening.   04/07/2016 at Unknown time  . MELATONIN PO Take 1 tablet by mouth at bedtime.   04/07/2016 at Unknown time  . pravastatin (PRAVACHOL) 20 MG tablet Take 20 mg by mouth every evening.   04/07/2016 at Unknown time  . timolol (BETIMOL) 0.25 % ophthalmic solution Place 1-2 drops into both eyes 2 (two) times daily.   04/08/2016 at Unknown time  . triamcinolone (KENALOG) 0.1 % paste Use as directed 1 application in the mouth  or throat 2 (two) times daily as needed (blisters).   Past Week at Unknown time    ROS Neg  Pulse 66, temperature 97.5 F (36.4 C), temperature source Oral, resp. rate 18, SpO2 97 %. Physical Exam   Uterine prolapse grade 2/3 Cystocele grade 2-3/4   Assessment/Plan: Uterine prolapse/Cystocele for Robotic TLH/BSO with Utero-Sacral ligament suspension.  Possible Cystocele repair by myself.  TVT/Cystoscopy Dr Pamala Hurry.  Surgery and risks reviewed thoroughly.  Marie-Lyne Lavoie 04/08/2016, 8:05 AM

## 2016-04-08 NOTE — Addendum Note (Signed)
Addendum  created 04/08/16 1454 by Hewitt Blade, CRNA   Sign clinical note

## 2016-04-08 NOTE — Op Note (Signed)
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 endotracheal anesthesia Antibiotics 2 g Ancef Complications: None Estimated blood loss: 5 cc Findings: Hypermobile urethra,  Atrophic vaginal mucosa, normal bladder and urethra, no sling material seen in bladder or urethra post procedure, patent uteters b/l.   Indications: 76 yo pt undergoing robotic assisted TLH/ BSO with b/l uterosacral ligament suspension by Dr. Dellis Filbert. Pre-operatively we met to discuss her h/o stress incontinence, now mostly masked by her prolapse. Decision was made to proceed with prophylactic TVT sling.  Patient has been counseled on the risk and benefits of this procedure including the risk of urinary retention (and possibly needing to go home with a catheter), risk of sling erosion, immediate surgical and anaesthesia risks,  and possibly worsening or causing de-novo urge incontinence. Patient consented to TVT sling.  Procedure: After informed consent was obtained from the patient she was taken to the operating room where general anesthesia was initiated without difficulty.  The TLH/ BSO/ uterosacral ligament suspension was completed by Dr. Dellis Filbert and that note is separate.   A cystoscopy was done to ensure both ureters were working well after the above suspension. Methylene blue was given at the end of the suspension and blue tinged dye was see coming from each ureter.  The mons pubis was shaved, the pubic symphysis was marked for the planned bilateral exit points, 2 cm lateral to the midline just above the a pubic symphysis.  A solution of 20 cc of 1% lidocaine with 1:100,000 units of epinephrine mixed with 60 cc of normal  saline was used as the injection. 20 cc of this solution was injected into the suprapubic space bilaterally, an additional 20 cc was injected along the planned exit routes bilaterally. This placement was accomplished by placing a 20-gauge spinal needle from the planned abdominal exit points behind the pubic symphysis and into the retropubic space. This space was confirmed with the use of the hand in the vagina to feel the needle tip prior to aspirating and injecting. A Foley catheter was then placed to drain the bladder;  the balloon was inflated and used to help identify the bladder and to plan the sling placement in the mid urethral position. An Allis clamp was placed 2 cm below the urethral meatus; a second Allis clamp was placed 2 cm below that. 10 cc of an  injecting solution ( 10 cc of 50% water and 50% 1% plain lidocaine) were placed between the 2 Allis clamps with some lateral spread. A #15 blade was used to incise between the Allis clamps and Metzenbaum scissors were used to dissect the vaginal mucosa laterally. Allis clamps were placed on the vaginal edges to help accomplish this dissection. The dissection was taken to just below the level of the pubic symphysis. Once the planned entry routes were dissected a rigid Foley was placed into the bladder. The rigid Foley was deviated towards the patient's right knee. With the anesthesiologist pointing out the patient's right shoulder so that I could aim to the midclavicular line, the trocar with trocar sleeve and sling attached was inserted into the pre-dissected space and was pushed under the pubic symphysis. With dropping the trocar handle towards the ground and sharply rotating the trocar to the abdominal wall I was able to guide the trocar out the abdominal wall at the  planned exit point. A Kelly clamp was placed on the trocar sleeve the trocar was removed through the vagina. The sling tape was assessed and straightened and the trocar was placed in the  opposite trocar sleeve. Again with retracting the dissected vaginal mucosa and placing the trocar in the planned left sided entry route, along with deviating the rigid Foley to the patient's left knee and aiming towards the left midclavicular line the trocar was passed under the pubic symphysis sharply rotated out of the abdominal wall and out the planned exit route. At this point cystoscopy was carried out. The bladder was filled to 300 cc and evaluation was done of the entire bladder mucosa and the urethra to ensure no sling material was eroding through the mucosa.  Using a Babcock clamp to protect a small knuckle of sling material in the midpoint of the sling just under the urethra, the sling was pulled out through the abdominal entry points. Proper tensioning was assured with the use of a Babcock clamp and once I was assured a tension-free placement Kelly clamps were placed on the plastic sheaths at the abdominal exit points and the abdominal sheaths were removed. At this point a Kary Kos was released and a tension-free placement was seen. Good hemostasis was noted, a repeat cystoscopy was done to confirm no sling material and the bladder or urethra. The vaginal mucosa was closed with 2-0 Vicryl on a CT-1. The patient tolerated the procedure well. Sponge, lap and needle counts were correct x3 and the patient was taken to the recovery room in stable condition.  FOGLEMAN,KELLY A. 04/08/2016 11:46 AM

## 2016-04-08 NOTE — Anesthesia Postprocedure Evaluation (Signed)
Anesthesia Post Note  Patient: Sandra Wheeler  Procedure(s) Performed: Procedure(s) (LRB): ROBOTIC ASSISTED TOTAL HYSTERECTOMY WITH BILATERAL SALPINGO OOPHORECTOMY/Uterosacral Ligament Suspension (Bilateral) TRANSVAGINAL TAPE (TVT) PROCEDURE (N/A) CYSTOSCOPY (N/A)  Patient location during evaluation: PACU Anesthesia Type: General Level of consciousness: awake and alert and oriented Pain management: pain level controlled Vital Signs Assessment: post-procedure vital signs reviewed and stable Respiratory status: spontaneous breathing and nonlabored ventilation Cardiovascular status: blood pressure returned to baseline and stable Postop Assessment: no signs of nausea or vomiting Anesthetic complications: no        Last Vitals:  Vitals:   04/08/16 1345 04/08/16 1400  BP: (!) 153/63 (!) 163/67  Pulse: 70 76  Resp: 12 13  Temp:  36.3 C    Last Pain:  Vitals:   04/08/16 0707  TempSrc: Oral   Pain Goal: Patients Stated Pain Goal: 3 (04/08/16 1233)               FOSTER,MICHAEL A.

## 2016-04-08 NOTE — Anesthesia Preprocedure Evaluation (Signed)
Anesthesia Evaluation  Patient identified by MRN, date of birth, ID band Patient awake    Reviewed: Allergy & Precautions, NPO status , Patient's Chart, lab work & pertinent test results  History of Anesthesia Complications (+) PONV and history of anesthetic complications  Airway Mallampati: I  TM Distance: >3 FB Neck ROM: Full    Dental no notable dental hx. (+) Teeth Intact, Caps   Pulmonary neg pulmonary ROS,    Pulmonary exam normal breath sounds clear to auscultation       Cardiovascular hypertension, Pt. on medications + angina Normal cardiovascular exam Rhythm:Regular Rate:Normal     Neuro/Psych Seizures -, Well Controlled,  negative psych ROS   GI/Hepatic Neg liver ROS, GERD  Medicated and Controlled,  Endo/Other  negative endocrine ROS  Renal/GU negative Renal ROS   Cystocele    Musculoskeletal   Abdominal   Peds  Hematology negative hematology ROS (+)   Anesthesia Other Findings   Reproductive/Obstetrics Uterine prolapse                             Anesthesia Physical Anesthesia Plan  ASA: II  Anesthesia Plan: General   Post-op Pain Management:    Induction: Intravenous  Airway Management Planned: Oral ETT  Additional Equipment:   Intra-op Plan:   Post-operative Plan: Extubation in OR  Informed Consent: I have reviewed the patients History and Physical, chart, labs and discussed the procedure including the risks, benefits and alternatives for the proposed anesthesia with the patient or authorized representative who has indicated his/her understanding and acceptance.   Dental advisory given  Plan Discussed with: Anesthesiologist, CRNA and Surgeon  Anesthesia Plan Comments:         Anesthesia Quick Evaluation

## 2016-04-08 NOTE — Transfer of Care (Signed)
Immediate Anesthesia Transfer of Care Note  Patient: Sandra Wheeler  Procedure(s) Performed: Procedure(s) with comments: ROBOTIC ASSISTED TOTAL HYSTERECTOMY WITH BILATERAL SALPINGO OOPHORECTOMY/Uterosacral Ligament Suspension (Bilateral) - Request 4 hrs. TRANSVAGINAL TAPE (TVT) PROCEDURE (N/A) CYSTOSCOPY (N/A)  Patient Location: PACU  Anesthesia Type:General  Level of Consciousness: sedated  Airway & Oxygen Therapy: Patient Spontanous Breathing and Patient connected to nasal cannula oxygen  Post-op Assessment: Report given to RN, Post -op Vital signs reviewed and stable and Patient moving all extremities  Post vital signs: Reviewed and stable  Last Vitals:  Vitals:   04/08/16 0707  Pulse: 66  Resp: 18  Temp: 36.4 C    Last Pain:  Vitals:   04/08/16 0707  TempSrc: Oral      Patients Stated Pain Goal: 3 ([id number])  Complications: No apparent anesthesia complications

## 2016-04-08 NOTE — Anesthesia Procedure Notes (Signed)
Procedure Name: Intubation Date/Time: 04/08/2016 8:31 AM Performed by: Hewitt Blade Pre-anesthesia Checklist: Patient identified, Emergency Drugs available, Suction available and Patient being monitored Patient Re-evaluated:Patient Re-evaluated prior to inductionOxygen Delivery Method: Circle system utilized Preoxygenation: Pre-oxygenation with 100% oxygen Intubation Type: IV induction Ventilation: Mask ventilation without difficulty Laryngoscope Size: Mac and 3 Grade View: Grade I Tube type: Oral Tube size: 7.0 mm Number of attempts: 1 Airway Equipment and Method: Stylet Placement Confirmation: ETT inserted through vocal cords under direct vision,  positive ETCO2 and breath sounds checked- equal and bilateral Secured at: 20 cm Tube secured with: Tape Dental Injury: Teeth and Oropharynx as per pre-operative assessment

## 2016-04-08 NOTE — Op Note (Signed)
04/08/2016  11:34 AM  PATIENT:  Sandra Wheeler  76 y.o. female  PRE-OPERATIVE DIAGNOSIS:  Uterine Prolapse, Cystocele  POST-OPERATIVE DIAGNOSIS:  uterine prolapse, cystocele  PROCEDURE:  Procedure(s): ROBOTIC ASSISTED TOTAL HYSTERECTOMY WITH BILATERAL SALPINGO OOPHORECTOMY/Uterosacral Ligament Suspension TRANSVAGINAL TAPE (TVT) PROCEDURE (Dr Pamala Hurry) CYSTOSCOPY (Dr Pamala Hurry)  SURGEON:  Surgeon(s): Princess Bruins, MD Aloha Gell, MD  ASSISTANTS: Aloha Gell MD   ANESTHESIA:   general   PROCEDURE:  Under general anesthesia with endotracheal intubation the patient is in lithotomy position area she is prepped with ChloraPrep on the abdomen and with Betadine on the suprapubic, vulvar and vaginal areas.  The patient is draped as usual. The Foley is inserted in the bladder.  The vaginal exam reveals an anteverted uterus, normal volume, no adnexal mass.  The weighted speculum is inserted in the vagina and the anterior lip of the cervix is grasped with a tenaculum. The cervix is dilated with Hegar dilators.  A #8 Rumi with a small Koh ring are put in place.  The other instruments are removed.  Abdominally, we infiltrate the supra umbilical area with Marcaine half plain. A 1.5 cm incision is done at that level with the scalpel.  The aponeurosis is grasped with cokers. The aponeurosis is opened with the Mayo scissors.  The parietal peritoneum is opened bluntly with the finger. A pursestring stitch of Vicryl 0 is done on the aponeurosis.  The Sheryle Hail is inserted at that level and up pneumoperitoneum is created with CO2.  The camera is inserted at that level inspection of the abdominopelvic cavities reveal no abnormality. The lower anterior abdominal wall is free of adhesions.  We used a semicircular configuration for port placement with 2 robotic ports on the right one robotic ports on the lower left and the assistant port on the upper left.  Infiltration with Marcaine half plain at all levels  with small incisions made with the scalpel. Each port is inserted under direct vision.  The patient is placed in deep Trendelenburg.  The robot his docked from the right side. The robotic instruments are inserted under direct vision with the Endo Shears scissors in the first arm, the PK in the second arm and the pro grasp in the third arm.  We then go to the console.      The uterus is normal in size and appearance with 2 normal tubes and 2 normal small ovaries. Notes that the on Koh ring perforated at the upper posterior aspect of the uterus and the balloon is visible. Manipulation of the uterus is not affected by that.  Both ureters are seen with peristalsis and normal anatomy position.  We starts on the right side with cauterization and section of the right round ligament. We then cauterized and section the right infundibulopelvic ligament and follow right under the ovary.  We then opened the anterior visceral peritoneum and started descending the bladder anteriorly.  We proceed exactly the same way on the left side.  The bladder is further descended well past the [facility] ring.  We then cauterized and section the left uterine artery. We cauterized and sectioned the right uterine artery. The vaginal occluder was filled.  We opened the upper vagina anteriorly with the tip of the Endo Shears scissor over the [place] ring, we then opened progressively on each lateral side and finish with opening posteriorly. The uterus with cervix, both tubes and both ovaries were removed vaginally and sent to pathology.  We then verified hemostasis on the vaginal vault  which was adequate and we further pushed the bladder down away from the vaginal vault.  The instruments were switched to the cutting needle driver in the first arm, the long tip in the second arm and the PK in the third arm. We used a V lock 0, 9 inches to close the vaginal vault in a running suture going from the right angle the left and then to the midline.  We kept that  suture long in the midline to be able to pull on it to show the uterosacral ligaments better.  Both uterosacral ligaments were well visualized and far from the ureter on each side.  We used a V lock 0 at 6 inches for the uterosacral ligament suspension on each side.  We then used and Ethibond 0 on each side to fix it permanently to the mid vaginal vault.  All sutures were inserted and removed through the assistant port.  The utero sacral ligament suspension produced of very good lift of the vaginal vault.  Good peristalsis was seen at the level of the right ureter.  The left ureter was not as well seen.  Therefore a cystoscopy was done by Dr. Pamala Hurry at that point.  Good ureteral jets were seen coming from both the right and left ureter.  Hemostasis was adequate at all levels.  All robotic instruments were removed.  The robot was undocked. The patient was removed from deep Trendelenburg.  All ports were removed under direct vision. The pneumoperitoneum was evacuated. The supraumbilical incision was closed by attaching the pursestring stitch on the aponeurosis. A separate stitch of Vicryl 4-0 was done on the adipose tissue at that level. All incisions were closed with a subcuticular suture of Vicryl 4-0. Dermabond was added on all incisions. Hemostasis was adequate at that level as well as.  The vaginal occluder had been removed. The vaginal exam was done which showed a very good suspension of the vaginal vault and reduction of the cystocele.  Dr. Pamala Hurry will dictate her TVT procedure and cystoscopy.  ESTIMATED BLOOD LOSS: 10 cc   Intake/Output Summary (Last 24 hours) at 04/08/16 1134 Last data filed at 04/08/16 1109  Gross per 24 hour  Intake             1000 ml  Output              110 ml  Net              890 ml     BLOOD ADMINISTERED:none   LOCAL MEDICATIONS USED:  MARCAINE     SPECIMEN:  Source of Specimen:  Uterus with cervix, bilateral tubes and ovaries  DISPOSITION OF SPECIMEN:   PATHOLOGY  COUNTS:  YES  PLAN OF CARE: Transfer to PACU  Princess Bruins MD  04/08/2016 at 11:36 am

## 2016-04-09 ENCOUNTER — Encounter (HOSPITAL_COMMUNITY): Payer: Self-pay

## 2016-04-09 DIAGNOSIS — N813 Complete uterovaginal prolapse: Secondary | ICD-10-CM | POA: Diagnosis not present

## 2016-04-09 LAB — CBC
HCT: 30.3 % — ABNORMAL LOW (ref 36.0–46.0)
Hemoglobin: 10.5 g/dL — ABNORMAL LOW (ref 12.0–15.0)
MCH: 33.2 pg (ref 26.0–34.0)
MCHC: 34.7 g/dL (ref 30.0–36.0)
MCV: 95.9 fL (ref 78.0–100.0)
Platelets: 265 10*3/uL (ref 150–400)
RBC: 3.16 MIL/uL — ABNORMAL LOW (ref 3.87–5.11)
RDW: 12.3 % (ref 11.5–15.5)
WBC: 12.4 10*3/uL — ABNORMAL HIGH (ref 4.0–10.5)

## 2016-04-09 NOTE — Progress Notes (Signed)
Discharge teaching complete with pt. Pt understood all information and did not have any questions. Pt ambulated out of the hospital and discharged home to family.  

## 2016-04-09 NOTE — Discharge Instructions (Signed)

## 2016-04-09 NOTE — Progress Notes (Signed)
1 Day Post-Op Procedure(s) (LRB): ROBOTIC ASSISTED TOTAL HYSTERECTOMY WITH BILATERAL SALPINGO OOPHORECTOMY/Uterosacral Ligament Suspension (Bilateral) TRANSVAGINAL TAPE (TVT) PROCEDURE (N/A) CYSTOSCOPY (N/A)  Subjective: Patient reports that pain is well managed.  Tolerating normal diet as tolerated  diet without difficulty. No nausea / vomiting.  Ambulating and voiding.  Objective: BP (!) 123/47 (BP Location: Right Arm)   Pulse 80   Temp 98.3 F (36.8 C) (Oral)   Resp 16   Ht 5\' 2"  (1.575 m)   Wt 146 lb (66.2 kg)   SpO2 96%   BMI 26.70 kg/m  Lungs: clear Heart: normal rate and rhythm Abdomen:soft and appropriately tender Extremities: Homans sign is negative, no sign of DVT Incision: healing well  Results for TOCCARA, WIGAL (MRN [id number]) as of 04/09/2016 11:22  Ref. Range 04/09/2016 05:18  WBC Latest Ref Range: 4.0 - 10.5 K/uL 12.4 (H)  RBC Latest Ref Range: 3.87 - 5.11 MIL/uL 3.16 (L)  Hemoglobin Latest Ref Range: 12.0 - 15.0 g/dL 10.5 (L)  HCT Latest Ref Range: 36.0 - 46.0 % 30.3 (L)  MCV Latest Ref Range: 78.0 - 100.0 fL 95.9  MCH Latest Ref Range: 26.0 - 34.0 pg 33.2  MCHC Latest Ref Range: 30.0 - 36.0 g/dL 34.7  RDW Latest Ref Range: 11.5 - 15.5 % 12.3  Platelets Latest Ref Range: 150 - 400 K/uL 265    Assessment: s/p Procedure(s): ROBOTIC ASSISTED TOTAL HYSTERECTOMY WITH BILATERAL SALPINGO OOPHORECTOMY/Uterosacral Ligament Suspension TRANSVAGINAL TAPE (TVT) PROCEDURE CYSTOSCOPY: progressing well  Plan: Discharge home  LOS: 0 days    Marie-Lyne Lavoie 04/09/2016, 11:21 AM

## 2016-04-09 NOTE — Discharge Summary (Signed)
Physician Discharge Summary  Patient ID: Sandra Wheeler MRN: [id number] DOB/AGE: [birth year] 76 y.o.  Admit date: 04/08/2016 Discharge date: 04/09/2016  Admission Diagnoses: Uterine Prolapse, Cystocele  Discharge Diagnoses: Uterine Prolapse, Cystocele        Active Problems:   Post-operative state   Discharged Condition: good  Hospital Course: good  Consults: None  Treatments: surgery: Robotic Total Laparoscopic Hysterectomy with Bilateral Salpingo-Oophorectomy, Utero-sacral Ligament Suspension, TVT, Cystocele  Disposition: 01-Home or Self Care   Allergies as of 04/09/2016      Reactions   Codeine Other (See Comments)   Knocked her out      Medication List    TAKE these medications   ALPRAZolam 0.25 MG tablet Commonly known as:  XANAX Take 0.125 mg by mouth at bedtime as needed for sleep.   aspirin 81 MG chewable tablet Chew 81 mg by mouth every other day. Will stop prior to procedure   b complex vitamins tablet Take 1 tablet by mouth daily.   BIOTIN PO Take 1 tablet by mouth daily.   CoQ10 100 MG Caps Take 100 mg by mouth every evening.   [employer] D3 2000 units Caps Generic drug:  Cholecalciferol Take 2,000 Units by mouth daily.   FLAX SEED OIL PO Take 1 tablet by mouth daily. Flaxseed oil 14 mg   KLS ALLER-TEC PO Take 1 tablet by mouth daily.   losartan 50 MG tablet Commonly known as:  COZAAR Take 50 mg by mouth every evening.   MELATONIN PO Take 1 tablet by mouth at bedtime.   pravastatin 20 MG tablet Commonly known as:  PRAVACHOL Take 20 mg by mouth every evening.   timolol 0.25 % ophthalmic solution Commonly known as:  BETIMOL Place 1-2 drops into both eyes 2 (two) times daily.   triamcinolone 0.1 % paste Commonly known as:  KENALOG Use as directed 1 application in the mouth or throat 2 (two) times daily as needed (blisters).   TYLENOL PO Take 1 tablet by mouth daily as needed (pain / headache).   VIBERZI 75 MG Tabs Generic drug:   Eluxadoline Take 37.5 mg by mouth daily. Takes 0.5 tablet      Follow-up Information    Princess Bruins, MD Follow up in 3 week(s).   Specialty:  Obstetrics and Gynecology Contact information: [place] [address] [phone]           Signed: Princess Bruins, MD 04/09/2016, 11:26 AM

## 2016-04-10 ENCOUNTER — Encounter (HOSPITAL_COMMUNITY): Payer: Self-pay | Admitting: Obstetrics & Gynecology

## 2016-04-13 ENCOUNTER — Encounter (HOSPITAL_COMMUNITY): Payer: Self-pay | Admitting: Obstetrics & Gynecology

## 2016-06-19 DIAGNOSIS — M810 Age-related osteoporosis without current pathological fracture: Secondary | ICD-10-CM

## 2016-06-19 HISTORY — DX: Age-related osteoporosis without current pathological fracture: M81.0

## 2016-06-20 ENCOUNTER — Other Ambulatory Visit: Payer: Self-pay | Admitting: Obstetrics & Gynecology

## 2016-06-20 DIAGNOSIS — Z780 Asymptomatic menopausal state: Secondary | ICD-10-CM

## 2016-06-21 ENCOUNTER — Encounter: Payer: Self-pay | Admitting: Gynecology

## 2016-06-21 ENCOUNTER — Ambulatory Visit (INDEPENDENT_AMBULATORY_CARE_PROVIDER_SITE_OTHER): Payer: Medicare Other

## 2016-06-21 ENCOUNTER — Telehealth: Payer: Self-pay | Admitting: Gynecology

## 2016-06-21 ENCOUNTER — Other Ambulatory Visit: Payer: Self-pay | Admitting: Obstetrics & Gynecology

## 2016-06-21 DIAGNOSIS — M810 Age-related osteoporosis without current pathological fracture: Secondary | ICD-10-CM | POA: Diagnosis not present

## 2016-06-21 DIAGNOSIS — Z780 Asymptomatic menopausal state: Secondary | ICD-10-CM

## 2016-06-21 NOTE — Telephone Encounter (Signed)
Pt informed with the below note, I routed [place] a staff message to obtain a copy of most recent dexa from [place] office, front desk will call pt to schedule consult visit.

## 2016-06-21 NOTE — Telephone Encounter (Signed)
Tell patient her most recent bone density shows osteoporosis. Recommend:  #1 obtain copy of most recent bone density done elsewhere #2 office visit to discuss treatment options with Dr. Dellis Filbert

## 2016-06-22 ENCOUNTER — Other Ambulatory Visit: Payer: Self-pay | Admitting: Obstetrics & Gynecology

## 2016-06-22 ENCOUNTER — Other Ambulatory Visit: Payer: Self-pay | Admitting: Gynecology

## 2016-06-22 DIAGNOSIS — M810 Age-related osteoporosis without current pathological fracture: Secondary | ICD-10-CM

## 2016-06-30 ENCOUNTER — Ambulatory Visit (INDEPENDENT_AMBULATORY_CARE_PROVIDER_SITE_OTHER): Payer: Medicare Other | Admitting: Obstetrics & Gynecology

## 2016-06-30 ENCOUNTER — Encounter: Payer: Self-pay | Admitting: Obstetrics & Gynecology

## 2016-06-30 VITALS — BP 136/74

## 2016-06-30 DIAGNOSIS — M810 Age-related osteoporosis without current pathological fracture: Secondary | ICD-10-CM | POA: Diagnosis not present

## 2016-06-30 NOTE — Patient Instructions (Signed)
Osteoporosis.  Most severe at Left Femur Neck (hip) at -2.8.  Preventation and treatment reviewed.  Ca++ wnl.  Vitamin D done today.  Will obtain your previous Bone Density report and compare.  My office will call you with further recommendations based on the change in your Bone Density.  Pamphlet on Prolia given.

## 2016-06-30 NOTE — Progress Notes (Signed)
    Sandra Wheeler [birth year] [id number]        76 y.o.  G1P1 Established.  Married.  Menopause.  No HRT.    Presenting for counseling on Bone Density result:  Osteoporosis.  Taking 1 Tum most days.  H/O Fosamax Rx x >10 yrs, d/ced a few years ago.  Last BD >3 yrs ago, will obtain record.  H/O fractures x 2.  Past medical history,surgical history, problem list, medications, allergies, family history and social history were all reviewed and documented in the EPIC chart.  Directed ROS with pertinent positives and negatives documented in the history of present illness/assessment and plan.  Exam:  Vitals:   06/30/16 1030  BP: 136/74   General appearance:  Normal  BD 06/2016 at [facility]:  Lt Femoral neck T score -2.8  Osteoporosis                                  Lt Total knee T score -2.7 Osteoporosis                                  Spine Osteopenia  Assessment/Plan:  76 y.o. 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  Weight bearing Physical Activity.  Continue Tums qd.  Mild/cheese products.  Will Obtain last BD for comparison.  If a marked decrease in BD, recommend Prolia.  If relatively stable, may opt to start back on Biphosphonates like Actonel.    The risk and benefits of Prolia were discussed with the patient today to include a significant risk reduction for vertebral, hip and non-vertebral fractures. Potential risk also discussed were skin advanced to include rash, pruritus, eczema and other skin reactions. The recurrence of infections and clinical studies with PROLIA, including serious infections were also discussed with the patient. In 3 year clinical trial with Prolia although six-year trial data does not show any evidence for any additional or accelerating risk of infection. Other risks that were discussed included a 1 in 1000-10,000 risk of osteonecrosis of the jaw based on current  available data.  Pamphlet given  Counseling >50% 25 min on above Dx, investigation, risks and different treatment alternatives.  Princess Bruins MD, 11:15 am 06/30/2016

## 2016-07-05 LAB — VITAMIN D 1,25 DIHYDROXY
Vitamin D 1, 25 (OH)2 Total: 45 pg/mL (ref 18–72)
Vitamin D2 1, 25 (OH)2: <8 pg/mL
Vitamin D3 1, 25 (OH)2: 45 pg/mL

## 2016-07-20 ENCOUNTER — Telehealth: Payer: Self-pay | Admitting: *Deleted

## 2016-07-20 NOTE — Telephone Encounter (Signed)
Pt called asking if previous dexa was received from 3 years ago, I called pt back and left a detailed message on her voicemail stating her paper chart has been sent to scan center to scan in epic. And that I am not sure if her previous scan is with her chart. If she had it faxed to our office it should be however.

## 2016-08-16 ENCOUNTER — Telehealth: Payer: Self-pay | Admitting: *Deleted

## 2016-08-16 NOTE — Telephone Encounter (Signed)
-----   Message from Princess Bruins, MD sent at 08/16/2016  8:26 AM EDT ----- Regarding: Treatment of Osteoporosis I called patient this week-end and left a message on her answering machine about my recommendation to treat with Prolia.  Please call her to see what she decided.  If she decides to go with Prolia, we can organize for first shot or she can come again to discuss with me.  If she decides not to take Prolia, she should restart on Biphosphanate, Fosamax or Actonel.

## 2016-08-16 NOTE — Telephone Encounter (Signed)
Left message for pt to call.

## 2016-08-18 ENCOUNTER — Telehealth: Payer: Self-pay | Admitting: Gynecology

## 2016-08-18 NOTE — Telephone Encounter (Signed)
Good, thank you.

## 2016-08-18 NOTE — Telephone Encounter (Signed)
Pt said she would like to start process for Prolia, I explained to patient I will forward to Pam to have benefits checked and Pam will be in contact with her regarding this.  Pam patient will be out of the country 6[place]9-6/25.  Note from [place] per Dr Dellis Filbert. I will send in insurance info.

## 2016-08-18 NOTE — Telephone Encounter (Signed)
Pt said she would like to start process for Prolia, I explained to patient I will forward to Pam to have benefits checked and Pam will be in contact with her regarding this.  Pam patient will be out of the country 6/9-6/25.

## 2016-08-18 NOTE — Telephone Encounter (Signed)
Received note from [place] about Prolia. No Calcium level in chart and No complete exam. I will ask Dr Dellis Filbert about both of the above  Due to the protocol for Prolia. We can have pt come in for Calcium level after I check with Dr Dellis Filbert

## 2016-08-31 NOTE — Telephone Encounter (Signed)
Yes annual exam in 2017 does work and Calcium needs to be within one year.

## 2016-08-31 NOTE — Telephone Encounter (Signed)
Please schedule Annual exam/gyn exam and Ca++ level asap.

## 2016-08-31 NOTE — Telephone Encounter (Signed)
Please bring in for Ca++ level.  If not due for Annual/Gyn exam, would the note from [place] the requirements for Prolia?  If last Annual/Gyn exam was in 2017, insurance would probably cover and can schedule Annual/Gyn at same time as Ca++ level.

## 2016-08-31 NOTE — Telephone Encounter (Signed)
Ok, reviewed Medical records from [employer].  Can schedule Annual/Gyn exam with Ca ++ level now.  Patient had surgery in 2018, but no Annual/Gyn visit.

## 2016-09-05 NOTE — Telephone Encounter (Signed)
Just so you know pt said she will c/b to schedule she was in [place] and she did state she has had her annual already.note from DM.

## 2016-10-10 NOTE — Telephone Encounter (Signed)
Calcium 05/30/16 - scanned result - normal.  I called and checked benefits for patient.  Pt has met deductible and almost OPM (only under OPM by $6).  Therefore once OPMis met Prolia covered 100%. Was advised needs Prior authorization. Was given information to fax therefore faxed form with records to [employer]. Danetta called from [facility] stating that no PA needed for out of state providers and to call if had problem with claim [phone]. Pt will be informed. KW CMA

## 2016-10-17 NOTE — Telephone Encounter (Signed)
PC to pt she wanted to know her cost if she had not met her deductible and her OOPM I explained that if nothing changes her cost would be approximate $1000 per year due to meeting her deductible and her co insurance. She at this time does not want the Prolia and would like to come in for consult to talk to Dr Dellis Filbert.

## 2016-10-24 ENCOUNTER — Encounter: Payer: Self-pay | Admitting: Anesthesiology

## 2016-10-26 ENCOUNTER — Ambulatory Visit (INDEPENDENT_AMBULATORY_CARE_PROVIDER_SITE_OTHER): Payer: Medicare Other | Admitting: Obstetrics & Gynecology

## 2016-10-26 ENCOUNTER — Telehealth: Payer: Self-pay | Admitting: Gynecology

## 2016-10-26 ENCOUNTER — Encounter: Payer: Self-pay | Admitting: Obstetrics & Gynecology

## 2016-10-26 VITALS — BP 138/70

## 2016-10-26 DIAGNOSIS — M810 Age-related osteoporosis without current pathological fracture: Secondary | ICD-10-CM

## 2016-10-26 DIAGNOSIS — K121 Other forms of stomatitis: Secondary | ICD-10-CM | POA: Diagnosis not present

## 2016-10-26 NOTE — Progress Notes (Signed)
    Sandra Wheeler 09/24/1940 [id number]        76 y.o.  G1P1  Married.   RP:  Osteoporosis for counseling on Prolia  HPI:  Previously on Fosamax and Actonel.  Currently on Tums.  Vit D at 45 on 06/30/2016.  [facility]:  Lt Femoral neck T score -2.8  Osteoporosis.  Lt Total knee T score -2.7 Osteoporosis.  Spine Osteopenia.  Physically active.    Past medical history,surgical history, problem list, medications, allergies, family history and social history were all reviewed and documented in the EPIC chart.  Directed ROS with pertinent positives and negatives documented in the history of present illness/assessment and plan.  Exam:  Vitals:   10/26/16 1209  BP: 138/70   General appearance:  Normal   Assessment/Plan:  76 y.o. G1P1  1. Age-related osteoporosis without current pathological fracture Benefits/Risks of Prolia further discussed today.  Patient has no CI to treatment.  Questions all answered.  Decision to proceed with 1st dose today.  2. Mouth ulcers Following dental work, but persisting.  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  Decision to refer to Rheumatologist to investigate and manage.  Counseling on above issues >50% x 15 minutes.  Princess Bruins MD, 12:21 PM 10/26/2016

## 2016-10-26 NOTE — Patient Instructions (Signed)
1. Age-related osteoporosis without current pathological fracture Benefits/Risks of Prolia further discussed today.  Patient has no CI to treatment.  Questions all answered.  Decision to proceed with 1st dose today.  2. Mouth ulcers Following dental work, but persisting.  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  Decision to refer to Rheumatologist to investigate and manage.  Sandra Wheeler, it was good to see you today!  I sent the request for referral to Rheumatology, you should receive a phone call shortly.

## 2016-10-26 NOTE — Telephone Encounter (Signed)
She is undecided about the Prolia due to cost for her in 2019. She will call insurance provider to verify our information. Her cost this year for Prolia is $0. Do you want her to have one injection this year and then next year she can decide not to continue if cost is too high for her?? She can come at any time to receive the injection.

## 2016-10-27 ENCOUNTER — Telehealth: Payer: Self-pay | Admitting: *Deleted

## 2016-10-27 NOTE — Telephone Encounter (Signed)
-----   Message from Princess Bruins, MD sent at 10/26/2016 12:39 PM EDT ----- Regarding: Refer to Rheumatologist Buccal ulcers suspicious for Autoimmune condition per Dentist and Dermatologist.  Refer to Rheumatologist for evaluation and management.

## 2016-10-27 NOTE — Telephone Encounter (Signed)
Referral faxed with office notes to Dr.Deveshwar office, they will contact to schedule and let me know time and date.

## 2016-11-01 NOTE — Telephone Encounter (Signed)
PC from pt returned, she will come in for Prolia injection 11/02/16  At  930 am

## 2016-11-01 NOTE — Telephone Encounter (Addendum)
Yes, please give the first dose and it will give her 6 months to think about it. Thanks  Note from Dr Dellis Filbert. I called pt NO DPR form . PC to pt  I told her to call me back with a time and date I can talk to her.

## 2016-11-02 ENCOUNTER — Ambulatory Visit (INDEPENDENT_AMBULATORY_CARE_PROVIDER_SITE_OTHER): Payer: Medicare Other | Admitting: Anesthesiology

## 2016-11-02 DIAGNOSIS — M810 Age-related osteoporosis without current pathological fracture: Secondary | ICD-10-CM

## 2016-11-02 MED ORDER — DENOSUMAB 60 MG/ML ~~LOC~~ SOLN
60.0000 mg | Freq: Once | SUBCUTANEOUS | Status: AC
  Administered 2016-11-02: 60 mg via SUBCUTANEOUS

## 2016-11-15 NOTE — Telephone Encounter (Addendum)
Prolia given 11/02/16  Next injection will be after 05/06/17

## 2016-11-23 NOTE — Telephone Encounter (Signed)
Spoke with Dr.Deshwar office and was informed that more office notes are needed from dentist and dermatologist. I called and related this to patient with faxed number given to have notes faxed.

## 2017-03-10 ENCOUNTER — Encounter: Payer: Self-pay | Admitting: Genetic Counselor

## 2017-04-04 ENCOUNTER — Telehealth: Payer: Self-pay | Admitting: Gynecology

## 2017-04-04 NOTE — Telephone Encounter (Signed)
PC to pt requesting Prolia injection due after 05/05/17.    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   Complete exam Dellis Filbert,  10/2016  Benefits have been sent to [place]. No insurance changes.   Will consult with Dr Dellis Filbert regarding Calcium level ? Repeat

## 2017-04-05 NOTE — Telephone Encounter (Signed)
Increase Ca++ in nutrition and repeat Ca++ level.

## 2017-04-11 NOTE — Telephone Encounter (Addendum)
Pt states she has increased her Calcium intake with nutrition.

## 2017-04-11 NOTE — Telephone Encounter (Signed)
Pt had Calcium level 05/30/16 at PCP normal 9.3. I will ok this with Dr Dellis Filbert . Pt is ready to proceed with Prolia injection per Maudie Mercury A.

## 2017-04-11 NOTE — Telephone Encounter (Signed)
Can start Prolia.

## 2017-04-12 NOTE — Telephone Encounter (Addendum)
Deductible $800(0MET)  OOP MAX $1700 ($0MET)  Upcoming dental procedures NO   Prior Authorization needed NO PA required for out of state providers Per [facility] [place]    Pt estimated Cost [phone]  Appt 05/08/17 at 9:00   Coverage Details:  10% of One dose of Prolia =$108  10 % of Administration fee=4.5

## 2017-04-13 NOTE — Telephone Encounter (Signed)
PC from [place] [facility] [place]  , she stated  no PA required for out of state providers. No PA needed is needed Sandra Wheeler will call and inform patient and schedule Prolia

## 2017-04-20 ENCOUNTER — Inpatient Hospital Stay: Payer: Medicare (Managed Care)

## 2017-04-20 ENCOUNTER — Inpatient Hospital Stay: Payer: Medicare (Managed Care) | Attending: Genetic Counselor | Admitting: Genetic Counselor

## 2017-04-20 ENCOUNTER — Encounter: Payer: Self-pay | Admitting: Genetic Counselor

## 2017-04-20 DIAGNOSIS — Z7183 Encounter for nonprocreative genetic counseling: Secondary | ICD-10-CM

## 2017-04-20 DIAGNOSIS — Z809 Family history of malignant neoplasm, unspecified: Secondary | ICD-10-CM

## 2017-04-20 NOTE — Progress Notes (Signed)
[facility]      Initial Visit   Patient Name: Sandra Wheeler Patient DOB: August 27, 1940 Patient Age: 77 y.o. Encounter Date: 04/20/2017  Referring Provider: Self referred  Primary Care Provider: Marda Stalker, PA-C  Reason for Visit: Evaluate for hereditary susceptibility to cancer    Assessment and Plan:  . [name]'s history is not suggestive of a hereditary predisposition to cancer given the ages of onset of the cancers and the many relatives without cancer who lived to older ages.   . We discussed that testing is not clinically indicated, but that it is available determine whether she has a pathogenic mutation that will impact her screening and risk-reduction for cancer. A negative result will be reassuring.  . [name] wished to pursue genetic testing and a blood sample will be sent for analysis of the 83 genes on [facility]'s Multi-Cancer panel (ALK, APC, ATM, AXIN2, BAP1, BARD1, BLM, BMPR1A, BRCA1, BRCA2, BRIP1, CASR, CDC73, CDH1, CDK4, CDKN1B, CDKN1C, CDKN2A, CEBPA, CHEK2, CTNNA1, DICER1, DIS3L2, EGFR, EPCAM, FH, FLCN, GATA2, GPC3, GREM1, HOXB13, HRAS, KIT, MAX, MEN1, MET, MITF, MLH1, MSH2, MSH3, MSH6, MUTYH, NBN, NF1, NF2, NTHL1, PALB2, PDGFRA, PHOX2B, PMS2, POLD1, POLE, POT1, PRKAR1A, PTCH1, PTEN, RAD50, RAD51C, RAD51D, RB1, RECQL4, RET, RUNX1, SDHA, SDHAF2, SDHB, SDHC, SDHD, SMAD4, SMARCA4, SMARCB1, SMARCE1, STK11, SUFU, TERC, TERT, TMEM127, TP53, TSC1, TSC2, VHL, WRN, WT1).   . Results should be available in approximately 2-4 weeks, at which point we will contact her and address implications for her as well as address genetic testing for at-risk family members, if needed.     Dr. Jana Wheeler was available for questions concerning this case. Total time spent by me in face-to-face counseling was approximately 30 minutes.   _____________________________________________________________________   History of Present Illness: Ms. Sandra Wheeler, a 77  y.o. female, is being seen at the [facility] due to a family history of cancer. She presents to clinic today with her daughter, Sandra Wheeler, to discuss the possibility of a hereditary predisposition to cancer and discuss whether genetic testing is warranted.  [name] has no personal history of cancer.  She undergoes a yearly mammogram and has periodic clinical breast exams. She indicated that she is due for a colonoscopy this year. She had two previous colonoscopies and reports a few polyps were removed, but did not recall the exact number or type(s).   Past Medical History:  Diagnosis Date  . Anginal pain ([place])   . Breast cyst, right   . Cataract    unsure of which eye  . Colon polyps   . Environmental allergies   . GERD (gastroesophageal reflux disease)   . Glaucoma   . High cholesterol   . Hypertension   . IBS (irritable bowel syndrome)   . Osteoporosis 06/2016   T score -2.8  . PONV (postoperative nausea and vomiting)   . 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 BLADDER SUSPENSION N/A 04/08/2016   Procedure: TRANSVAGINAL TAPE (TVT) PROCEDURE;  Surgeon: Princess Bruins, MD;  Location: [facility];  Service: Gynecology;  Laterality: N/A;  . BREAST SURGERY     cyst removal  . CARDIAC CATHETERIZATION    . COLONOSCOPY    . CYSTOSCOPY N/A 04/08/2016   Procedure: CYSTOSCOPY;  Surgeon: Princess Bruins, MD;  Location: [facility];  Service: Gynecology;  Laterality: N/A;  . DENTAL SURGERY  03/31/2016  . ELBOW FRACTURE SURGERY    . EYE SURGERY Left    blood clot on retina  . ROBOTIC ASSISTED TOTAL HYSTERECTOMY WITH BILATERAL SALPINGO OOPHERECTOMY Bilateral 04/08/2016   Procedure: ROBOTIC ASSISTED TOTAL HYSTERECTOMY WITH BILATERAL SALPINGO OOPHORECTOMY/Uterosacral Ligament Suspension;  Surgeon: Princess Bruins, MD;  Location: [facility];  Service: Gynecology;  Laterality: Bilateral;  Request 4  hrs.  . TUBAL LIGATION    . WRIST ARTHROSCOPY      Social History   Socioeconomic History  . Marital status: Married    Spouse name: Not on file  . Number of children: Not on file  . Years of education: Not on file  . Highest education level: Not on file  Social Needs  . Financial resource strain: Not on file  . Food insecurity - worry: Not on file  . Food insecurity - inability: Not on file  . Transportation needs - medical: Not on file  . Transportation needs - non-medical: Not on file  Occupational History  . Not on file  Tobacco Use  . Smoking status: Never Smoker  . Smokeless tobacco: Never Used  Substance and Sexual Activity  . Alcohol use: Yes  . Drug use: No  . Sexual activity: Not on file  Other Topics Concern  . Not on file  Social History Narrative  . Not on file     Family History:  During the visit, a 4-generation pedigree was obtained. Family tree will be scanned in the Media tab in Epic  Significant diagnoses include the following:  Family History  Problem Relation Age of Onset  . Macular degeneration Mother   . Hearing loss Mother   . Diabetes Father        dx in his 67s-60s  . Heart disease Father   . Pancreatic cancer Brother 64       non-smoker; deceased 73  . Cancer Maternal Grandmother        abdominal; unsure of primary; deceased 71  . Heart disease Maternal Grandfather   . Congestive Heart Failure Paternal Grandfather   . Other Brother        at birth  . Cancer Other        MGMs sister, abdominal; unsure of primary; deceased 23s  . Pancreatic cancer Maternal Uncle 93       non-smoker; deceased 54  . Alcohol abuse Maternal Uncle   . Dementia Paternal Aunt   . Congestive Heart Failure Paternal Uncle   . Leukemia Cousin        female; daughter of mat uncle with pancreatic ca; deceased 73    Additionally, [name] has one daughter (age 46). She had 2 brothers (noted above). Her mother died a day shy of 100; cancer-free. Her mother  had 3 brothers. Her father died at 36; cancer-free. He had 3 brothers and 2 sisters.  [name] ancestry is [place] and [place]. There is no known Jewish ancestry and no consanguinity.  Discussion: We reviewed the characteristics, features and inheritance patterns of hereditary cancer syndromes. We discussed her risk of harboring a mutation in the context of her personal and family history. We discussed the process of genetic testing, insurance coverage and implications of results: positive, negative and variant of unknown significance (VUS).    Ms. Crisostomo questions were answered to her satisfaction today and she is welcome to call with any additional questions or concerns. Thank you for the referral and allowing [place] to share in the care of your patient.  Steele Berg, MS, [place] Certified Genetic Counselor phone: [phone] [email or web address]

## 2017-04-28 ENCOUNTER — Encounter: Payer: Self-pay | Admitting: Genetic Counselor

## 2017-04-28 ENCOUNTER — Ambulatory Visit: Payer: Self-pay | Admitting: Genetic Counselor

## 2017-04-28 DIAGNOSIS — Z1379 Encounter for other screening for genetic and chromosomal anomalies: Secondary | ICD-10-CM | POA: Insufficient documentation

## 2017-04-28 HISTORY — DX: Encounter for other screening for genetic and chromosomal anomalies: Z13.79

## 2017-04-28 NOTE — Progress Notes (Signed)
[facility]       Genetic Test Results    Patient Name: Sandra Wheeler Patient DOB: [birth year] Patient Age: 77 y.o. Encounter Date: 04/28/2017  Referring Provider: Self referred  Primary Care Provider: Marda Stalker, PA-C   Ms. Montemayor was called today to discuss genetic test results. Please see the Genetics note from her visit on 04/20/17 for a detailed discussion of her personal and family history.  Genetic Testing: At the time of Ms. Kubly's visit, she decided to pursue genetic testing of multiple genes associated with hereditary susceptibility to cancer. Testing included sequencing and deletion/duplication analysis. Testing did not reveal any pathogenic mutation in any of these genes.  A copy of the genetic test report will be scanned into Epic under the Media tab.  The genes analyzed were the 83 genes on [facility]'s Multi-Cancer panel (ALK, APC, ATM, AXIN2, BAP1, BARD1, BLM, BMPR1A, BRCA1, BRCA2, BRIP1, CASR, CDC73, CDH1, CDK4, CDKN1B, CDKN1C, CDKN2A, CEBPA, CHEK2, CTNNA1, DICER1, DIS3L2, EGFR, EPCAM, FH, FLCN, GATA2, GPC3, GREM1, HOXB13, HRAS, KIT, MAX, MEN1, MET, MITF, MLH1, MSH2, MSH3, MSH6, MUTYH, NBN, NF1, NF2, NTHL1, PALB2, PDGFRA, PHOX2B, PMS2, POLD1, POLE, POT1, PRKAR1A, PTCH1, PTEN, RAD50, RAD51C, RAD51D, RB1, RECQL4, RET, RUNX1, SDHA, SDHAF2, SDHB, SDHC, SDHD, SMAD4, SMARCA4, SMARCB1, SMARCE1, STK11, SUFU, TERC, TERT, TMEM127, TP53, TSC1, TSC2, VHL, WRN, WT1).  Since the current test is not perfect, it is possible that there may be a gene mutation that current testing cannot detect, but that chance is small. It is possible that a different genetic factor, which has not yet been discovered or is not on this panel, is responsible for the cancer diagnoses in the family. Again, the likelihood of this is low. No additional testing is recommended at this time for Ms. Rickles.  Three Variants of Uncertain Significance were detected: MUTYH c.925C>T  (p.Arg309Cys), POLD1 c.2066G>A (p.Arg689Gln), and RECQL4 c.2967G>A (p.Met989Ile). This is still considered a normal result. While at this time, it is unknown if this finding is associated with increased cancer risk, the majority of these variants get reclassified to be inconsequential. Medical management should not be based on this finding. With time, we suspect the lab will determine the significance, if any. If we do learn more about it, we will try to contact Ms. Racca to discuss it further. It is important to stay in touch with [place] periodically and keep the address and phone number up to date.  Cancer Screening: These results are reassuring and indicate that Ms. Taussig does not likely have an increased risk of cancer due to a mutation in one of these genes. She is recommended to undergo cancer screenings for individuals in the general population. The [employer]  recommends that women follow the breast screening recommendations below, but that these may need to be modified based on other risk factors such as dense breasts, biopsy history or family history.  Breast awareness - Women should be familiar with their breasts and promptly report changes to their healthcare provider.   Starting at age 48: Breast exam, risk assessment, and risk reduction counseling by the provider and mammogram every year. The provider may discuss screening with tomosynthesis.  Ms. Vasseur had a TAH/BSO last year. Whether to have routine gynecologic follow-up will be up to her provider. She is recommended to speak with her GI doctor as to when to have her next colonoscopy.  Family Members: Family members may be  at some increased risk of developing cancer, over the general population risk, simply due to the family history. Women are recommended to have a yearly mammogram beginning at age 30, a yearly clinical breast exam, a yearly gynecologic exam and perform monthly breast self-exams. Colon cancer  screening is recommended to begin by age 61 in both men and women, unless there is a family history of colon cancer or colon polyps or an individual has a personal history to warrant initiating screening at a younger age.  Any relative who had cancer at a young age or had a particularly rare cancer may also wish to pursue genetic testing. Genetic counselors can be located in other cities, by visiting the website of the [employer] (ArtistMovie.se) and Field seismologist for a Dietitian by zip code.   Family members are not recommended to get tested for the above VUSs outside of a research protocol as this finding has no implications for their medical management.  Lastly, cancer genetics is a rapidly advancing field and it is possible that new genetic tests will be appropriate for Ms. Tafoya in the future. We encourage her to remain in contact with [place] on an annual basis so we can update her personal and family histories, and let her know of advances in cancer genetics that may benefit the family. Our contact number was provided. Ms. Norby is welcome to call anytime with additional questions.     Steele Berg, MS, [place] Certified Genetic Counselor phone: [phone]

## 2017-05-08 ENCOUNTER — Ambulatory Visit (INDEPENDENT_AMBULATORY_CARE_PROVIDER_SITE_OTHER): Payer: Medicare Other | Admitting: Anesthesiology

## 2017-05-08 DIAGNOSIS — M810 Age-related osteoporosis without current pathological fracture: Secondary | ICD-10-CM | POA: Diagnosis not present

## 2017-05-08 MED ORDER — DENOSUMAB 60 MG/ML ~~LOC~~ SOLN
60.0000 mg | Freq: Once | SUBCUTANEOUS | Status: AC
  Administered 2017-05-08: 60 mg via SUBCUTANEOUS

## 2017-05-11 ENCOUNTER — Encounter: Payer: Self-pay | Admitting: Obstetrics & Gynecology

## 2017-05-11 ENCOUNTER — Ambulatory Visit (INDEPENDENT_AMBULATORY_CARE_PROVIDER_SITE_OTHER): Payer: Medicare Other | Admitting: Obstetrics & Gynecology

## 2017-05-11 VITALS — BP 126/82

## 2017-05-11 DIAGNOSIS — N8110 Cystocele, unspecified: Secondary | ICD-10-CM | POA: Diagnosis not present

## 2017-05-11 DIAGNOSIS — N3943 Post-void dribbling: Secondary | ICD-10-CM | POA: Diagnosis not present

## 2017-05-11 NOTE — Progress Notes (Signed)
    Sandra Wheeler 08-01-1940 [id number]        77 y.o.  G1P1   RP: Occasional vaginal bulge with dribbling of urine   HPI: S/P Robotic TLH/BSO/US ligament suspension with me and TVT procedure with Dr Pamala Hurry on 04/08/2016.  Occasionally feeling a small bulging in vagina since 01/2017, happened 3-4 times.  Pushed it back with fingers and was fine after.  Some dribbling of urine.  No frequency, no burning, no urgency.  No Stress Urinary Incontinence.  No pelvic pain.  Normal vaginal secretions.  Not sexually active.  No fever.   OB History  Gravida Para Term Preterm AB Living  1 1       1   SAB TAB Ectopic Multiple Live Births               # Outcome Date GA Lbr Len/2nd Weight Sex Delivery Anes PTL Lv  1 Para               Past medical history,surgical history, problem list, medications, allergies, family history and social history were all reviewed and documented in the EPIC chart.   Directed ROS with pertinent positives and negatives documented in the history of present illness/assessment and plan.  Exam:  Vitals:   05/11/17 0940  BP: 126/82   General appearance:  Normal  Abdomen: Normal  Gynecologic exam: Vulva normal.  Bimanual exam:  Minor erosion of sling felt on lower anterior vagina.  No pelvic mass felt, NT.  Vaginal vault well suspended.  Cystocele 1/4 with valsalva.  Speculum:  Small area of eroded sling visible.  U/A: Clear, white blood cells 0-5, red blood cells none, bacteria few, nitrites negative   Assessment/Plan:  77 y.o. G1P1   1. Urinary dribbling Urine analysis essentially negative, pending urine culture.  Recommend avoiding over filling of bladder, and more time in changes of position when passing urine.  Reassured. -U/A, reflex culture  2. Baden-Walker grade 1 cystocele Very small cystocele, rarely symptomatic.  No stress urinary incontinence   Post sling procedure.  Mild erosion of the sling tape, but asymptomatic and abstinent.  Patient informed  and recommendation to observe at this time.  Counseling on above issues more than 50% for 25 minutes.  Princess Bruins MD, 9:48 AM 05/11/2017

## 2017-05-12 ENCOUNTER — Encounter: Payer: Self-pay | Admitting: Obstetrics & Gynecology

## 2017-05-12 NOTE — Patient Instructions (Signed)
1. Urinary dribbling Urine analysis essentially negative, pending urine culture.  Recommend avoiding over filling of bladder, and more time in changes of position when passing urine.  Reassured. -U/A, reflex culture  2. Baden-Walker grade 1 cystocele Very small cystocele, rarely symptomatic.  No stress urinary incontinence   Post sling procedure.  Mild erosion of the sling tape, but asymptomatic and abstinent.  Patient informed and recommendation to observe at this time.  Sandra Wheeler, it was a pleasure seeing you today!  I will inform you of your urine culture as soon as it is available.

## 2017-05-15 ENCOUNTER — Encounter: Payer: Self-pay | Admitting: Obstetrics & Gynecology

## 2017-05-15 LAB — URINALYSIS, COMPLETE W/RFL CULTURE
Bilirubin Urine: NEGATIVE
Glucose, UA: NEGATIVE
Hgb urine dipstick: NEGATIVE
Hyaline Cast: NONE SEEN /LPF
Ketones, ur: NEGATIVE
Nitrites, Initial: NEGATIVE
Protein, ur: NEGATIVE
RBC / HPF: NONE SEEN /HPF (ref 0–2)
Specific Gravity, Urine: 1.015 (ref 1.001–1.03)
pH: 5.5 (ref 5.0–8.0)

## 2017-05-15 LAB — URINE CULTURE
MICRO NUMBER:: 90243320
SPECIMEN QUALITY:: ADEQUATE

## 2017-05-15 LAB — CULTURE INDICATED

## 2017-05-15 NOTE — Telephone Encounter (Signed)
Prolia Given 05/08/17 Next injection 11/06/17

## 2017-10-11 ENCOUNTER — Telehealth: Payer: Self-pay | Admitting: *Deleted

## 2017-10-11 NOTE — Telephone Encounter (Addendum)
Deductible  $800 ($823met)  OOP MAX $1700($560met)  Annual exam 11/16/17 upcoming ML  Calcium 9.0      Date 12/27/16  Upcoming dental procedures NO  Prior Authorization needed NO  Pt estimated Cost $112.40       Coverage Details: $10% one dose, 10% admin fee

## 2017-10-30 ENCOUNTER — Encounter: Payer: Medicare Other | Admitting: Obstetrics & Gynecology

## 2017-11-16 ENCOUNTER — Encounter: Payer: Self-pay | Admitting: Obstetrics & Gynecology

## 2017-11-16 ENCOUNTER — Ambulatory Visit (INDEPENDENT_AMBULATORY_CARE_PROVIDER_SITE_OTHER): Payer: Medicare Other | Admitting: Obstetrics & Gynecology

## 2017-11-16 VITALS — BP 144/88 | Ht 62.0 in | Wt 150.0 lb

## 2017-11-16 DIAGNOSIS — Z01419 Encounter for gynecological examination (general) (routine) without abnormal findings: Secondary | ICD-10-CM

## 2017-11-16 DIAGNOSIS — M810 Age-related osteoporosis without current pathological fracture: Secondary | ICD-10-CM | POA: Diagnosis not present

## 2017-11-16 DIAGNOSIS — Z780 Asymptomatic menopausal state: Secondary | ICD-10-CM | POA: Diagnosis not present

## 2017-11-16 DIAGNOSIS — Z90710 Acquired absence of both cervix and uterus: Secondary | ICD-10-CM | POA: Diagnosis not present

## 2017-11-16 DIAGNOSIS — T83711A Erosion of implanted vaginal mesh and other prosthetic materials to surrounding organ or tissue, initial encounter: Secondary | ICD-10-CM | POA: Diagnosis not present

## 2017-11-16 MED ORDER — DENOSUMAB 60 MG/ML ~~LOC~~ SOSY
60.0000 mg | PREFILLED_SYRINGE | Freq: Once | SUBCUTANEOUS | Status: AC
  Administered 2017-11-16: 60 mg via SUBCUTANEOUS

## 2017-11-16 NOTE — Progress Notes (Signed)
Sandra Wheeler August 25, 1940 [id number]   History:    77 y.o. G1P1L1 Married  RP:  Established patient presenting for annual gyn exam   HPI: History of total hysterectomy.  Postmenopausal, well on no hormone replacement therapy.  No pelvic pain.  Resumed sexually activity as husband's erectile dysfunction has improved after changing his medications.  No pain with intercourse.  Husband has felt "material" at the entrance of the vagina at first but not feeling it anymore.  Urine normal, no SUI and bowel movements normal.  Breasts normal.  Body mass index 27.44.  Osteoporosis on Prolia.  Health labs with family physician.  Colonoscopy 6 years ago.  Past medical history,surgical history, family history and social history were all reviewed and documented in the EPIC chart.  Gynecologic History No LMP recorded. Patient has had a hysterectomy. Contraception: status post hysterectomy Last Pap: No recent pap test. Last mammogram: 2019. Results were: Negative Bone Density: 2018 Osteoporosis, Left Femoral Neck T-Score -2.8 Colonoscopy: 6 yrs ago  Obstetric History OB History  Gravida Para Term Preterm AB Living  1 1       1   SAB TAB Ectopic Multiple Live Births               # Outcome Date GA Lbr Len/2nd Weight Sex Delivery Anes PTL Lv  1 Para              ROS: A ROS was performed and pertinent positives and negatives are included in the history.  GENERAL: No fevers or chills. HEENT: No change in vision, no earache, sore throat or sinus congestion. NECK: No pain or stiffness. CARDIOVASCULAR: No chest pain or pressure. No palpitations. PULMONARY: No shortness of breath, cough or wheeze. GASTROINTESTINAL: No abdominal pain, nausea, vomiting or diarrhea, melena or bright red blood per rectum. GENITOURINARY: No urinary frequency, urgency, hesitancy or dysuria. MUSCULOSKELETAL: No joint or muscle pain, no back pain, no recent trauma. DERMATOLOGIC: No rash, no itching, no lesions. ENDOCRINE: No  polyuria, polydipsia, no heat or cold intolerance. No recent change in weight. HEMATOLOGICAL: No anemia or easy bruising or bleeding. NEUROLOGIC: No headache, seizures, numbness, tingling or weakness. PSYCHIATRIC: No depression, no loss of interest in normal activity or change in sleep pattern.     Exam:   BP (!) 144/88   Ht 5\' 2"  (1.575 m)   Wt 150 lb (68 kg)   BMI 27.44 kg/m   Body mass index is 27.44 kg/m.  General appearance : Well developed well nourished female. No acute distress HEENT: Eyes: no retinal hemorrhage or exudates,  Neck supple, trachea midline, no carotid bruits, no thyroidmegaly Lungs: Clear to auscultation, no rhonchi or wheezes, or rib retractions  Heart: Regular rate and rhythm, no murmurs or gallops Breast:Examined in sitting and supine position were symmetrical in appearance, no palpable masses or tenderness,  no skin retraction, no nipple inversion, no nipple discharge, no skin discoloration, no axillary or supraclavicular lymphadenopathy Abdomen: no palpable masses or tenderness, no rebound or guarding Extremities: no edema or skin discoloration or tenderness  Pelvic: Vulva: Normal             Vagina: No gross lesions or discharge.  Erosion of Sling along the side, but no protrusion and appears intact.  Cervix/Uterus absent  Adnexa  Without masses or tenderness  Anus: Normal   Assessment/Plan:  77 y.o. female for annual exam   1. Well female exam with routine gynecological exam Gynecologic exam status post total hysterectomy.  No indication for a Pap test today.  Breast exam normal.  Last mammogram in 2019 was negative.  Colonoscopy done 6 years ago.  Health labs with family physician.  2. S/P total hysterectomy  3. Postmenopause  4. Senile osteoporosis Osteoporosis with lowest T score at the left senior neck at -2.8 in April 2018.  Started on Prolia.  Will repeat bone density in April 2020.  Vitamin D supplements, calcium intake of 1.5 g/day  including nutritional and supplemental, regular weightbearing physical activity recommended.  5. Erosion of implanted vaginal mesh to surrounding organ or tissue, initial encounter ([place]) Erosion of the sling mesh along the border of its at the anterior entrance of the vagina.  Currently asymptomatic.  No stress urinary incontinence.  Findings discussed with patient and decision to observe.  If progression of erosion eventually causes symptoms to the patient, will refer back to Dr. Valentino Saxon who did the sling procedure.  Counseling on above issues and coordination of care more than 50% for 10 minutes.  Princess Bruins MD, 4:28 PM 11/16/2017

## 2017-11-18 ENCOUNTER — Encounter: Payer: Self-pay | Admitting: Obstetrics & Gynecology

## 2017-11-18 NOTE — Patient Instructions (Signed)
1. Well female exam with routine gynecological exam Gynecologic exam status post total hysterectomy.  No indication for a Pap test today.  Breast exam normal.  Last mammogram in 2019 was negative.  Colonoscopy done 6 years ago.  Health labs with family physician.  2. S/P total hysterectomy  3. Postmenopause  4. Senile osteoporosis Osteoporosis with lowest T score at the left senior neck at -2.8 in April 2018.  Started on Prolia.  Will repeat bone density in April 2020.  Vitamin D supplements, calcium intake of 1.5 g/day including nutritional and supplemental, regular weightbearing physical activity recommended.  5. Erosion of implanted vaginal mesh to surrounding organ or tissue, initial encounter ([place]) Erosion of the sling mesh along the border of its at the anterior entrance of the vagina.  Currently asymptomatic.  No stress urinary incontinence.  Findings discussed with patient and decision to observe.  If progression of erosion eventually causes symptoms to the patient, will refer back to Dr. Valentino Saxon who did the sling procedure.  Sandra Wheeler, it was a pleasure seeing you today!

## 2017-11-23 NOTE — Telephone Encounter (Signed)
PROLIA GIVEN 11/16/17 NEXT INJECTION 05/21/2018

## 2017-12-06 ENCOUNTER — Encounter: Payer: Self-pay | Admitting: Anesthesiology

## 2018-04-13 IMAGING — CR DG CHEST 2V
2 series · 2 of 2 positions shown · non-contrast
Comparison: 08/10/2010

CLINICAL DATA: Preop, hypertension.

EXAM:
CHEST  2 VIEW

[chest pa]
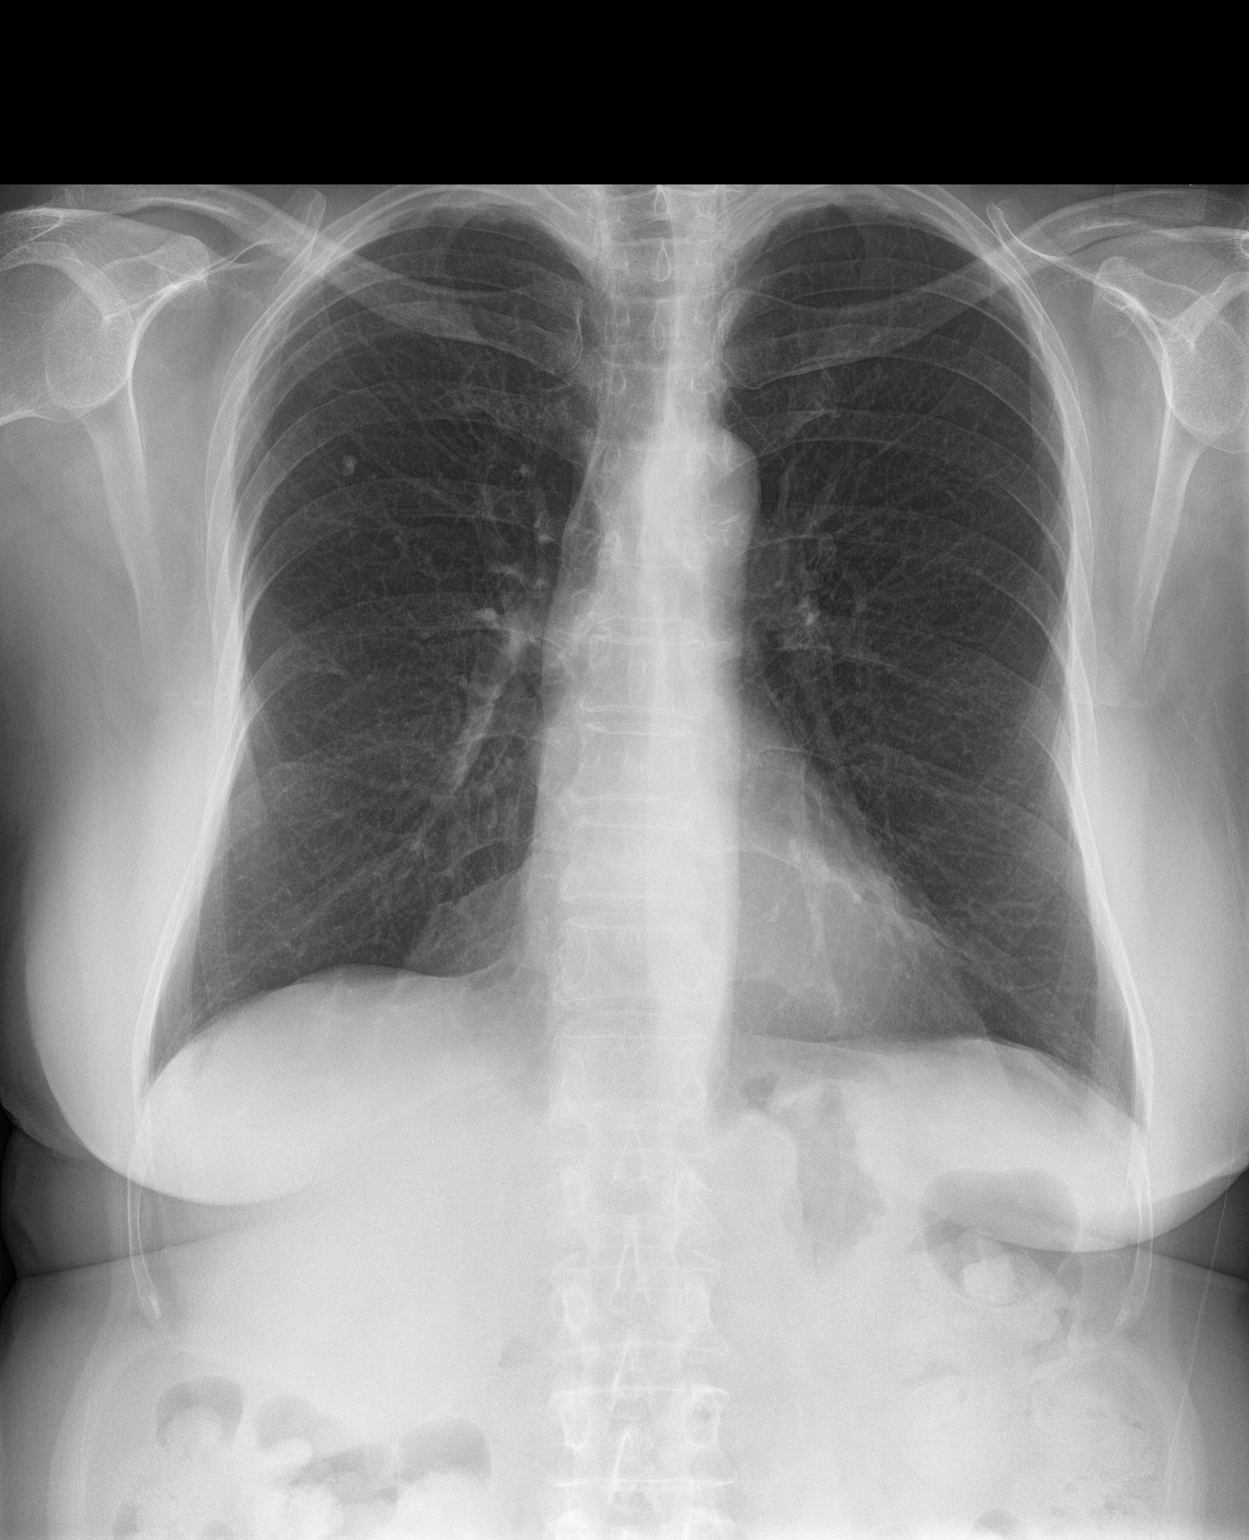

[chest lat]
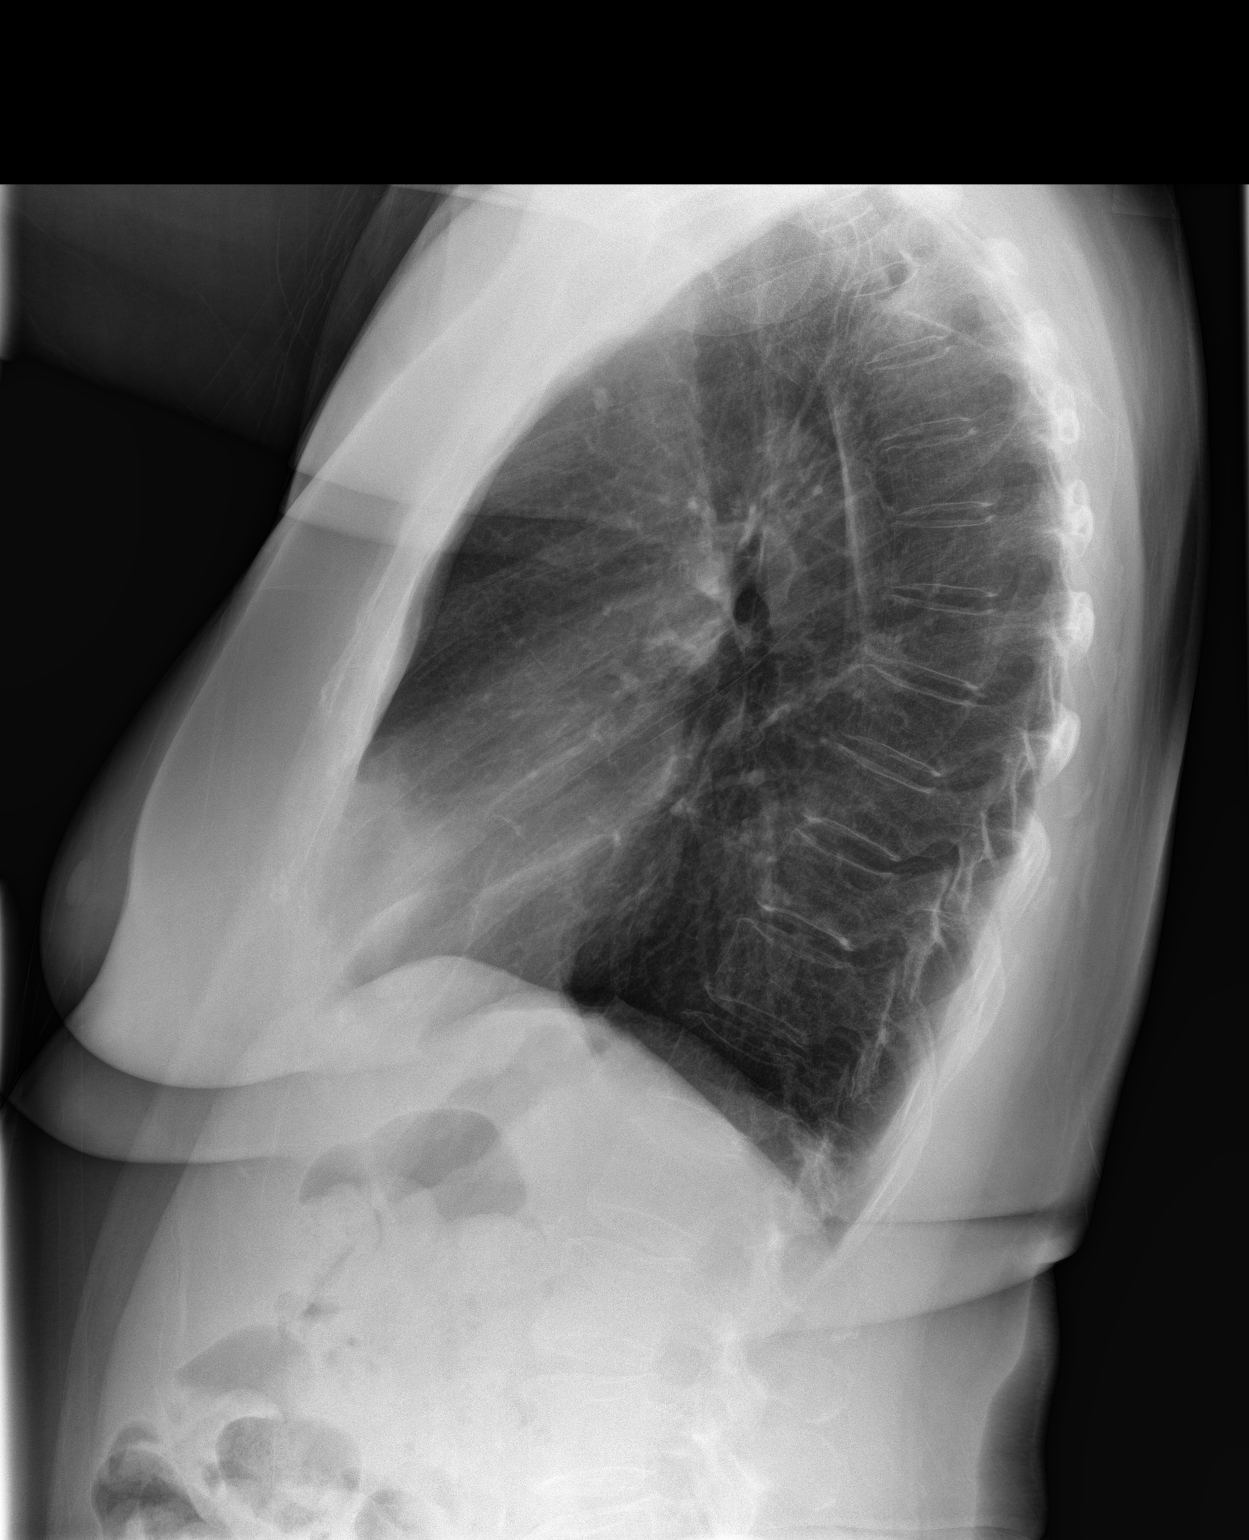

[2 of 2 positions shown; findings below may reference images not displayed]

FINDINGS: Heart and mediastinal contours are within normal limits. No focal
opacities or effusions. No acute bony abnormality. Calcified
granuloma in the right upper lobe, stable.
IMPRESSION: No active cardiopulmonary disease.

## 2018-05-21 ENCOUNTER — Telehealth: Payer: Self-pay | Admitting: *Deleted

## 2018-05-21 NOTE — Telephone Encounter (Signed)
-----   Message from Sinclair Grooms sent at 05/14/2018  3:11 PM EST ----- Regarding: prolia Patient called to inquire on prolia injection for 2020. When can we schedule it?

## 2018-05-21 NOTE — Telephone Encounter (Addendum)
Deductible  $800(34mt)  OOP MAX $1700 ($0 met)  Annual exam 11/16/17 ML  Calcium 9.3             Date 06/06/17  Upcoming dental procedures   Prior Authorization needed Yes per [employer] [id number] Pt would like to call me back  Courtesy call back in 1 week the week of  March 10th    Pt estimated Cost $910 pt aware  appt 06/04/2018 @ 2:30    Per BCBS no PA needed reference # [id number]    Coverage Details:coverage is BCBS medicare only per [employer]......10% one dose and 10% admin fee

## 2018-06-04 ENCOUNTER — Other Ambulatory Visit: Payer: Self-pay

## 2018-06-04 ENCOUNTER — Ambulatory Visit (INDEPENDENT_AMBULATORY_CARE_PROVIDER_SITE_OTHER): Payer: Medicare Other | Admitting: Anesthesiology

## 2018-06-04 DIAGNOSIS — M810 Age-related osteoporosis without current pathological fracture: Secondary | ICD-10-CM

## 2018-06-04 MED ORDER — DENOSUMAB 60 MG/ML ~~LOC~~ SOSY
60.0000 mg | PREFILLED_SYRINGE | Freq: Once | SUBCUTANEOUS | Status: AC
  Administered 2018-06-04: 60 mg via SUBCUTANEOUS

## 2018-06-13 NOTE — Telephone Encounter (Signed)
[place] GIVEN 06/04/2018 NEXT INJECTION 12/06/2018

## 2018-08-22 ENCOUNTER — Encounter: Payer: Self-pay | Admitting: Anesthesiology

## 2018-08-24 ENCOUNTER — Other Ambulatory Visit: Payer: Self-pay | Admitting: *Deleted

## 2018-08-24 DIAGNOSIS — M810 Age-related osteoporosis without current pathological fracture: Secondary | ICD-10-CM

## 2018-09-11 ENCOUNTER — Other Ambulatory Visit: Payer: Self-pay

## 2018-09-12 ENCOUNTER — Ambulatory Visit (INDEPENDENT_AMBULATORY_CARE_PROVIDER_SITE_OTHER): Payer: Medicare Other

## 2018-09-12 ENCOUNTER — Other Ambulatory Visit: Payer: Self-pay | Admitting: Obstetrics & Gynecology

## 2018-09-12 DIAGNOSIS — Z780 Asymptomatic menopausal state: Secondary | ICD-10-CM | POA: Diagnosis not present

## 2018-09-12 DIAGNOSIS — M810 Age-related osteoporosis without current pathological fracture: Secondary | ICD-10-CM | POA: Diagnosis not present

## 2018-10-11 ENCOUNTER — Encounter: Payer: Self-pay | Admitting: Obstetrics & Gynecology

## 2018-11-23 ENCOUNTER — Telehealth: Payer: Self-pay | Admitting: *Deleted

## 2018-11-23 DIAGNOSIS — M810 Age-related osteoporosis without current pathological fracture: Secondary | ICD-10-CM

## 2018-11-23 NOTE — Telephone Encounter (Signed)
Deductible $800 deductible ($800 met)  OOP MAX $900 out of pocket max ($164.75 met).  Annual exam NEEDS ANNUAL EXAM LEFT MESSAGE TO SCHEDULE APPT  Calcium  NEEDS LABS           Date  Upcoming dental procedures   Prior Authorization needed AUTHORIZATION REQUIRED: Yes PA PROCESS DETAILS: PA is required. PA must be obtained online through [employer] on [employer] [place] Provider Portal at [email or web address]. Provider may contact Help Desk additional questions at [phone].  Pt estimated Cost       Coverage Details:COVERAGE DETAILS: We have provided in network benefits only. For the primary MD Purchase option, Prolia and administration will be subject to a $800 deductible ($800 met) and 10% coinsurance up to a $900 out of pocket max ($164.75 met).

## 2018-12-05 NOTE — Telephone Encounter (Signed)
I explained to pt that we need annual exam as well as labs in order to proceed with Prolia. She stated she had her annual with her Primary Care Provider. I then explained that we need her Annual visit with [place] since we are the ones managing her osteoporosis. She stated she had to think about that and will call us to set up annual if she decides to proceed with our recommendations.

## 2018-12-27 ENCOUNTER — Encounter: Payer: Self-pay | Admitting: Gynecology

## 2019-03-01 NOTE — Telephone Encounter (Signed)
Pt called about Prolia and Annual exam would like to get shot before end of year due to deductible per previous conversation in September pt was aware we needed annual exam in order to proceed with Prolia.. I called schedueling to see if we have any openings no openings but patient is on wait list. Pt understands if we have a opening we will call her.Patient stated she may have to find another Practice and will wait 1 week incase theres openings.

## 2019-03-19 ENCOUNTER — Ambulatory Visit: Payer: Medicare Other | Attending: Internal Medicine

## 2019-03-19 DIAGNOSIS — Z20822 Contact with and (suspected) exposure to covid-19: Secondary | ICD-10-CM

## 2019-03-20 LAB — NOVEL CORONAVIRUS, NAA: SARS-CoV-2, NAA: NOT DETECTED

## 2019-04-05 ENCOUNTER — Other Ambulatory Visit: Payer: Self-pay

## 2019-04-08 ENCOUNTER — Telehealth: Payer: Self-pay | Admitting: *Deleted

## 2019-04-08 ENCOUNTER — Ambulatory Visit (INDEPENDENT_AMBULATORY_CARE_PROVIDER_SITE_OTHER): Payer: Medicare Other | Admitting: Obstetrics & Gynecology

## 2019-04-08 ENCOUNTER — Other Ambulatory Visit: Payer: Self-pay

## 2019-04-08 ENCOUNTER — Encounter: Payer: Self-pay | Admitting: Obstetrics & Gynecology

## 2019-04-08 VITALS — BP 128/84 | Ht 62.5 in | Wt 148.0 lb

## 2019-04-08 DIAGNOSIS — T83711D Erosion of implanted vaginal mesh and other prosthetic materials to surrounding organ or tissue, subsequent encounter: Secondary | ICD-10-CM

## 2019-04-08 DIAGNOSIS — Z90710 Acquired absence of both cervix and uterus: Secondary | ICD-10-CM | POA: Diagnosis not present

## 2019-04-08 DIAGNOSIS — Z01419 Encounter for gynecological examination (general) (routine) without abnormal findings: Secondary | ICD-10-CM | POA: Diagnosis not present

## 2019-04-08 DIAGNOSIS — Z780 Asymptomatic menopausal state: Secondary | ICD-10-CM

## 2019-04-08 DIAGNOSIS — T83711A Erosion of implanted vaginal mesh and other prosthetic materials to surrounding organ or tissue, initial encounter: Secondary | ICD-10-CM

## 2019-04-08 DIAGNOSIS — M810 Age-related osteoporosis without current pathological fracture: Secondary | ICD-10-CM

## 2019-04-08 LAB — CALCIUM: Calcium: 9.1 mg/dL (ref 8.6–10.4)

## 2019-04-08 NOTE — Patient Instructions (Signed)
  1. Well female exam with routine gynecological exam Gynecologic exam status post total hysterectomy and menopause.  Pap 2012, no indication to repeat.  Breast exam normal.  Screening mammo 09/2018 normal per patient, will obtain report.  Last colonoscopy in 2012, no further colonoscopy per Dr. Collene Mares.  Health labs with family physician.  Good body mass index at 26.64.  Continue with regular physical activities and healthy nutrition.  2. S/P total hysterectomy  3. Postmenopause Postmenopause, well on no HRT.  4. 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  5. Age-related osteoporosis without current pathological fracture Bone Density improved 08/2018 on Prolia.  Continue with Prolia.  Vitamin D supplements, calcium intake of 1200 mg daily and regular weightbearing physical activities.  Other orders - calcium carbonate (TUMS - DOSED IN MG ELEMENTAL CALCIUM) 500 MG chewable tablet; Chew 1 tablet by mouth daily. - cholecalciferol (VITAMIN D3) 25 MCG (1000 UNIT) tablet; Take 1,000 Units by mouth daily. - Melatonin 1 MG CAPS; Take by mouth.  Audi, it was a pleasure seeing you today as always!

## 2019-04-08 NOTE — Progress Notes (Signed)
Sandra Wheeler 1940-11-22 [id number]   History:    79 y.o.  G1P1L1 Married  RP:  Established patient presenting for annual gyn exam   HPI: History of total hysterectomy.  Postmenopausal, well on no hormone replacement therapy.  No pelvic pain.  Resumed sexually activity as husband's erectile dysfunction has improved after changing his medications.  No pain with intercourse. Husband has felt "material" at the entrance of the vagina at first but not feeling it anymore.  Urine normal, no SUI and bowel movements normal.  Breasts normal. Screening mammo 09/2018.  Body mass index 26.64.  Osteoporosis on Prolia.  Bone Density 08/2018 improved.  Health labs with family physician.  Colonoscopy 2012, no further Colono per Dr Collene Mares.  Past medical history,surgical history, family history and social history were all reviewed and documented in the EPIC chart.  Gynecologic History No LMP recorded. Patient has had a hysterectomy.  Obstetric History OB History  Gravida Para Term Preterm AB Living  1 1       1   SAB TAB Ectopic Multiple Live Births               # Outcome Date GA Lbr Len/2nd Weight Sex Delivery Anes PTL Lv  1 Para              ROS: A ROS was performed and pertinent positives and negatives are included in the history.  GENERAL: No fevers or chills. HEENT: No change in vision, no earache, sore throat or sinus congestion. NECK: No pain or stiffness. CARDIOVASCULAR: No chest pain or pressure. No palpitations. PULMONARY: No shortness of breath, cough or wheeze. GASTROINTESTINAL: No abdominal pain, nausea, vomiting or diarrhea, melena or bright red blood per rectum. GENITOURINARY: No urinary frequency, urgency, hesitancy or dysuria. MUSCULOSKELETAL: No joint or muscle pain, no back pain, no recent trauma. DERMATOLOGIC: No rash, no itching, no lesions. ENDOCRINE: No polyuria, polydipsia, no heat or cold intolerance. No recent change in weight. HEMATOLOGICAL: No anemia or easy bruising or  bleeding. NEUROLOGIC: No headache, seizures, numbness, tingling or weakness. PSYCHIATRIC: No depression, no loss of interest in normal activity or change in sleep pattern.     Exam:   BP 128/84   Ht 5' 2.5" (1.588 m)   Wt 148 lb (67.1 kg)   BMI 26.64 kg/m   Body mass index is 26.64 kg/m.  General appearance : Well developed well nourished female. No acute distress HEENT: Eyes: no retinal hemorrhage or exudates,  Neck supple, trachea midline, no carotid bruits, no thyroidmegaly Lungs: Clear to auscultation, no rhonchi or wheezes, or rib retractions  Heart: Regular rate and rhythm, no murmurs or gallops Breast:Examined in sitting and supine position were symmetrical in appearance, no palpable masses or tenderness,  no skin retraction, no nipple inversion, no nipple discharge, no skin discoloration, no axillary or supraclavicular lymphadenopathy Abdomen: no palpable masses or tenderness, no rebound or guarding Extremities: no edema or skin discoloration or tenderness  Pelvic: Vulva: Normal             Vagina: No gross lesions or discharge.  Stitches from sling felt at lower anterior vagina, mild stable erosion.  Cervix/Uterus absent  Adnexa  Without masses or tenderness  Anus: Normal   Assessment/Plan:  79 y.o. female for annual exam   1. Well female exam with routine gynecological exam Gynecologic exam status post total hysterectomy and menopause.  Pap 2012, no indication to repeat.  Breast exam normal.  Screening mammo 09/2018 normal per patient,  will obtain report.  Last colonoscopy in 2012, no further colonoscopy per Dr. Collene Mares.  Health labs with family physician.  Good body mass index at 26.64.  Continue with regular physical activities and healthy nutrition.  2. S/P total hysterectomy  3. Postmenopause Postmenopause, well on no HRT.  4. 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  5. Age-related osteoporosis  without current pathological fracture Bone Density improved 08/2018 on Prolia.  Continue with Prolia.  Vitamin D supplements, calcium intake of 1200 mg daily and regular weightbearing physical activities.  Other orders - calcium carbonate (TUMS - DOSED IN MG ELEMENTAL CALCIUM) 500 MG chewable tablet; Chew 1 tablet by mouth daily. - cholecalciferol (VITAMIN D3) 25 MCG (1000 UNIT) tablet; Take 1,000 Units by mouth daily. - Melatonin 1 MG CAPS; Take by mouth.  Princess Bruins MD, 2:52 PM 04/08/2019

## 2019-04-08 NOTE — Telephone Encounter (Signed)
Prolia insurance verification has been sent awaiting Summary of benefits  

## 2019-04-08 NOTE — Addendum Note (Signed)
Addended by: Lorine Bears on: 04/08/2019 02:33 PM   Modules accepted: Orders

## 2019-04-15 NOTE — Telephone Encounter (Signed)
Deductible $800 ($707.19MET)    Annual exam 04/08/2019 ML  Calcium 9.1            Date 04/08/2019  Upcoming dental procedures NO  Prior Authorization needed NO  Pt estimated Cost $817.19  Pt aware of cost of Prolia scheduled appt 04/15/2019           Coverage Details: 10% ONE DOSE, 10% ADMIN FEE

## 2019-04-16 ENCOUNTER — Other Ambulatory Visit: Payer: Self-pay

## 2019-04-16 ENCOUNTER — Ambulatory Visit (INDEPENDENT_AMBULATORY_CARE_PROVIDER_SITE_OTHER): Payer: Medicare Other | Admitting: Gynecology

## 2019-04-16 ENCOUNTER — Encounter: Payer: Medicare Other | Admitting: Obstetrics & Gynecology

## 2019-04-16 DIAGNOSIS — M810 Age-related osteoporosis without current pathological fracture: Secondary | ICD-10-CM | POA: Diagnosis not present

## 2019-04-16 MED ORDER — DENOSUMAB 60 MG/ML ~~LOC~~ SOSY
60.0000 mg | PREFILLED_SYRINGE | Freq: Once | SUBCUTANEOUS | Status: AC
  Administered 2019-04-16: 60 mg via SUBCUTANEOUS

## 2019-04-19 ENCOUNTER — Ambulatory Visit: Payer: Medicare Other

## 2019-04-24 ENCOUNTER — Ambulatory Visit: Payer: Medicare Other

## 2019-04-27 ENCOUNTER — Ambulatory Visit: Payer: Medicare Other | Attending: Internal Medicine

## 2019-04-27 DIAGNOSIS — Z23 Encounter for immunization: Secondary | ICD-10-CM | POA: Insufficient documentation

## 2019-04-27 NOTE — Progress Notes (Signed)
   Covid-19 Vaccination Clinic  Name:  Sandra Wheeler    MRN: [id number] DOB: 1940/07/19  04/27/2019  [name] was observed post Covid-19 immunization for 15 minutes without incidence. She was provided with Vaccine Information Sheet and instruction to access the V-Safe system.   [name] was instructed to call 911 with any severe reactions post vaccine: Marland Kitchen Difficulty breathing  . Swelling of your face and throat  . A fast heartbeat  . A bad rash all over your body  . Dizziness and weakness    Immunizations Administered    Name Date Dose VIS Date Route   Pfizer COVID-19 Vaccine 04/27/2019  4:00 PM 0.3 mL 03/01/2019 Intramuscular   Manufacturer: [place]   Lot: [id number]   [place]: [id number]

## 2019-05-08 ENCOUNTER — Encounter: Payer: Self-pay | Admitting: Gynecology

## 2019-05-22 ENCOUNTER — Ambulatory Visit: Payer: Medicare Other | Attending: Internal Medicine

## 2019-05-22 DIAGNOSIS — Z23 Encounter for immunization: Secondary | ICD-10-CM

## 2019-05-22 NOTE — Progress Notes (Signed)
   Covid-19 Vaccination Clinic  Name:  BRECKIN BRUN    MRN: [id number] DOB: 1940/05/12  05/22/2019  Ms. Huaracha was observed post Covid-19 immunization for 15 minutes without incident. She was provided with Vaccine Information Sheet and instruction to access the V-Safe system.   Ms. Roller was instructed to call 911 with any severe reactions post vaccine: Marland Kitchen Difficulty breathing  . Swelling of face and throat  . A fast heartbeat  . A bad rash all over body  . Dizziness and weakness   Immunizations Administered    Name Date Dose VIS Date Route   Pfizer COVID-19 Vaccine 05/22/2019  3:22 PM 0.3 mL 03/01/2019 Intramuscular   Manufacturer: [place]   Lot: [id number]   [place]: [id number]

## 2019-05-25 ENCOUNTER — Encounter (HOSPITAL_COMMUNITY): Payer: Self-pay

## 2019-05-25 ENCOUNTER — Emergency Department (HOSPITAL_COMMUNITY)
Admission: EM | Admit: 2019-05-25 | Discharge: 2019-05-25 | Disposition: A | Discharge status: Home / Self Care | Payer: Medicare Other | Attending: Emergency Medicine | Admitting: Emergency Medicine

## 2019-05-25 ENCOUNTER — Emergency Department (HOSPITAL_COMMUNITY): Payer: Medicare Other

## 2019-05-25 ENCOUNTER — Other Ambulatory Visit: Payer: Self-pay

## 2019-05-25 DIAGNOSIS — R519 Headache, unspecified: Secondary | ICD-10-CM | POA: Diagnosis not present

## 2019-05-25 DIAGNOSIS — Z79899 Other long term (current) drug therapy: Secondary | ICD-10-CM | POA: Diagnosis not present

## 2019-05-25 DIAGNOSIS — R079 Chest pain, unspecified: Secondary | ICD-10-CM | POA: Diagnosis not present

## 2019-05-25 LAB — BASIC METABOLIC PANEL
Anion gap: 9 (ref 5–15)
BUN: 20 mg/dL (ref 8–23)
CO2: 25 mmol/L (ref 22–32)
Calcium: 9 mg/dL (ref 8.9–10.3)
Chloride: 105 mmol/L (ref 98–111)
Creatinine, Ser: 0.66 mg/dL (ref 0.44–1.00)
GFR calc Af Amer: >60 mL/min (ref >60)
GFR calc non Af Amer: >60 mL/min (ref >60)
Glucose, Bld: 109 mg/dL — ABNORMAL HIGH (ref 70–99)
Potassium: 4.1 mmol/L (ref 3.5–5.1)
Sodium: 139 mmol/L (ref 135–145)

## 2019-05-25 LAB — CBC
HCT: 42 % (ref 36.0–46.0)
Hemoglobin: 13.8 g/dL (ref 12.0–15.0)
MCH: 32.5 pg (ref 26.0–34.0)
MCHC: 32.9 g/dL (ref 30.0–36.0)
MCV: 99.1 fL (ref 80.0–100.0)
Platelets: 282 10*3/uL (ref 150–400)
RBC: 4.24 MIL/uL (ref 3.87–5.11)
RDW: 11.1 % — ABNORMAL LOW (ref 11.5–15.5)
WBC: 5.9 10*3/uL (ref 4.0–10.5)
nRBC: 0 % (ref 0.0–0.2)

## 2019-05-25 LAB — TROPONIN I (HIGH SENSITIVITY)
Troponin I (High Sensitivity): 2 ng/L (ref <18)
Troponin I (High Sensitivity): 3 ng/L (ref <18)

## 2019-05-25 MED ORDER — ASPIRIN 81 MG PO CHEW
324.0000 mg | CHEWABLE_TABLET | Freq: Once | ORAL | Status: AC
  Administered 2019-05-25: 324 mg via ORAL
  Filled 2019-05-25: qty 4

## 2019-05-25 MED ORDER — SODIUM CHLORIDE 0.9% FLUSH
3.0000 mL | Freq: Once | INTRAVENOUS | Status: AC
  Administered 2019-05-25: 3 mL via INTRAVENOUS

## 2019-05-25 NOTE — Discharge Instructions (Addendum)
You were seen in the emergency department for some chest and upper back tightness.  You had blood work EKG and a chest x-ray that did not show any serious findings.  We are placing a referral for you into cardiology.  Please also follow-up with your regular primary care doctor.  Return to the emergency department if any worsening or concerning symptoms.

## 2019-05-25 NOTE — ED Provider Notes (Signed)
[facility] DEPT Provider Note   CSN: [id number] Arrival date & time: 05/25/19  1317     History Chief Complaint  Patient presents with  . Chest Pain  . Headache    KU. REPINSKI is a 79 y.o. female.  She said she had prior anginal pain in the past and her doctor gave her nitroglycerin but it has been a long time ago.  She was in the store today when she experienced a burning tightness across her chest and upper back.  She said it started about 30 minutes ago but resolved about 15 minutes ago.  She has some residual headache.  There was no radiation into arm or jaw.  Not associated with any dizziness nausea diaphoresis or shortness of breath. No numbness, weakness, or balance symptoms.  Had her second Covid shot few days ago.  Being treated for UTI with antibiotics for PCP at [facility].  The history is provided by the patient.  Chest Pain Pain location:  Substernal area Pain quality: burning and tightness   Pain radiates to:  Mid back Pain severity:  Moderate Onset quality:  Sudden Duration:  15 minutes Timing:  Rare Progression:  Resolved Chronicity:  Recurrent Relieved by:  Rest Worsened by:  Nothing Ineffective treatments:  None tried Associated symptoms: headache   Associated symptoms: no abdominal pain, no altered mental status, no cough, no diaphoresis, no fever, no heartburn, no nausea, no shortness of breath, no syncope and no vomiting   Risk factors: hypertension   Headache Associated symptoms: no abdominal pain, no cough, no fever, no nausea, no neck pain, no sore throat, no syncope and no vomiting        Past Medical History:  Diagnosis Date  . Anginal pain ([place])   . Breast cyst, right   . Cataract    unsure of which eye  . Colon polyps   . Environmental allergies   . Genetic testing 04/28/2017   Multi-Cancer panel (83 genes) @ [facility] - No pathogenic mutations detected  . GERD (gastroesophageal reflux disease)   . Glaucoma   .  High cholesterol   . Hypertension   . IBS (irritable bowel syndrome)   . Osteoporosis 06/2016   T score -2.8  . PONV (postoperative nausea and vomiting)   . 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 Genetic testing 04/28/2017  . Post-operative state 04/08/2016    Past Surgical History:  Procedure Laterality Date  . ABDOMINAL HYSTERECTOMY    . BLADDER SUSPENSION N/A 04/08/2016   Procedure: TRANSVAGINAL TAPE (TVT) PROCEDURE;  Surgeon: Princess Bruins, MD;  Location: [facility];  Service: Gynecology;  Laterality: N/A;  . BREAST SURGERY     cyst removal  . CARDIAC CATHETERIZATION    . CATARACT EXTRACTION, BILATERAL    . COLONOSCOPY    . CYSTOSCOPY N/A 04/08/2016   Procedure: CYSTOSCOPY;  Surgeon: Princess Bruins, MD;  Location: [facility];  Service: Gynecology;  Laterality: N/A;  . DENTAL SURGERY  03/31/2016  . ELBOW FRACTURE SURGERY    . EYE SURGERY Left    blood clot on retina  . ROBOTIC ASSISTED TOTAL HYSTERECTOMY WITH BILATERAL SALPINGO OOPHERECTOMY Bilateral 04/08/2016   Procedure: ROBOTIC ASSISTED TOTAL HYSTERECTOMY WITH BILATERAL SALPINGO OOPHORECTOMY/Uterosacral Ligament Suspension;  Surgeon: Princess Bruins, MD;  Location: [facility];  Service: Gynecology;  Laterality: Bilateral;  Request 4 hrs.  . TUBAL LIGATION    .  WRIST ARTHROSCOPY       OB History    Gravida  1   Para  1   Term      Preterm      AB      Living  1     SAB      TAB      Ectopic      Multiple      Live Births              Family History  Problem Relation Age of Onset  . Macular degeneration Mother   . Hearing loss Mother   . Diabetes Father        dx in his 1s-60s  . Heart disease Father   . Pancreatic cancer Brother 64       non-smoker; deceased 9  . Cancer Maternal Grandmother        abdominal; unsure of primary; deceased 50  . Heart disease Maternal Grandfather   . Congestive Heart Failure  Paternal Grandfather   . Other Brother        at birth  . Cancer Other        MGMs sister, abdominal; unsure of primary; deceased 75s  . Pancreatic cancer Maternal Uncle 93       non-smoker; deceased 52  . Alcohol abuse Maternal Uncle   . Dementia Paternal Aunt   . Congestive Heart Failure Paternal Uncle   . Leukemia Cousin        female; daughter of mat uncle with pancreatic ca; deceased 33    Social History   Tobacco Use  . Smoking status: Never Smoker  . Smokeless tobacco: Never Used  Substance Use Topics  . Alcohol use: Yes  . Drug use: No    Home Medications Prior to Admission medications   Medication Sig Start Date End Date Taking? Authorizing Provider  ALPRAZolam (XANAX) 0.25 MG tablet Take 0.125 mg by mouth.    [provider]  b complex vitamins capsule Take 1 capsule by mouth daily.    [provider]  calcium carbonate (TUMS - DOSED IN MG ELEMENTAL CALCIUM) 500 MG chewable tablet Chew 1 tablet by mouth daily.    [provider]  cholecalciferol (VITAMIN D3) 25 MCG (1000 UNIT) tablet Take 1,000 Units by mouth daily.    [provider]  clobetasol ointment (TEMOVATE) 0.05 % Apply topically. 02/23/17   [provider]  dexamethasone 0.5 MG/5ML elixir Swish and spit 47mL 2-4 times daily 04/14/17   [provider]  fluocinolone (VANOS) 0.01 % cream Apply topically 2 (two) times daily.    [provider]  losartan (COZAAR) 50 MG tablet Take 50 mg by mouth every evening.    [provider]  Melatonin 1 MG CAPS Take by mouth.    [provider]  pravastatin (PRAVACHOL) 20 MG tablet Take 20 mg by mouth every evening.    [provider]  PRESCRIPTION MEDICATION ANTIBIOTIC ? NAME    [provider]  timolol (BETIMOL) 0.25 % ophthalmic solution Place 1-2 drops into both eyes 2 (two) times daily.    [provider]  triamcinolone (KENALOG) 0.1 % paste Use as directed 1  application in the mouth or throat 2 (two) times daily as needed (blisters).    [provider]  vitamin C (ASCORBIC ACID) 250 MG tablet Take 250 mg by mouth daily.    [provider]  Zoster Vaccine Adjuvanted [facility]) injection  01/06/17  [provider]    Allergies    Codeine, Meloxicam, and Tramadol  Review of Systems   Review of Systems  Constitutional: Negative for diaphoresis and fever.  HENT: Negative for sore throat.   Eyes: Negative for visual disturbance.  Respiratory: Negative for cough and shortness of breath.   Cardiovascular: Positive for chest pain. Negative for syncope.  Gastrointestinal: Negative for abdominal pain, heartburn, nausea and vomiting.  Genitourinary: Negative for dysuria.  Musculoskeletal: Negative for neck pain.  Skin: Negative for rash.  Neurological: Positive for headaches.    Physical Exam Updated Vital Signs BP (!) 169/79 (BP Location: Left Arm)   Pulse 72   Temp 98.1 F (36.7 C) (Oral)   Resp 13   Ht 5\' 2"  (1.575 m)   Wt 65.8 kg   SpO2 95%   BMI 26.52 kg/m   Physical Exam Vitals and nursing note reviewed.  Constitutional:      General: She is not in acute distress.    Appearance: She is well-developed.  HENT:     Head: Normocephalic and atraumatic.  Eyes:     Conjunctiva/sclera: Conjunctivae normal.  Cardiovascular:     Rate and Rhythm: Normal rate and regular rhythm.     Heart sounds: Normal heart sounds. No murmur.  Pulmonary:     Effort: Pulmonary effort is normal. No respiratory distress.     Breath sounds: Normal breath sounds.  Abdominal:     Palpations: Abdomen is soft.     Tenderness: There is no abdominal tenderness.  Musculoskeletal:        General: Normal range of motion.     Cervical back: Neck supple.     Right lower leg: No tenderness. No edema.     Left lower leg: No tenderness. No edema.  Skin:    General: Skin is warm and dry.     Capillary Refill: Capillary refill takes  less than 2 seconds.  Neurological:     General: No focal deficit present.     Mental Status: She is alert.     ED Results / Procedures / Treatments   Labs (all labs ordered are listed, but only abnormal results are displayed) Labs Reviewed  BASIC METABOLIC PANEL - Abnormal; Notable for the following components:      Result Value   Glucose, Bld 109 (*)    All other components within normal limits  CBC - Abnormal; Notable for the following components:   RDW 11.1 (*)    All other components within normal limits  TROPONIN I (HIGH SENSITIVITY)  TROPONIN I (HIGH SENSITIVITY)    EKG EKG Interpretation  Date/Time:  Saturday May 25 2019 13:27:32 EST Ventricular Rate:  65 PR Interval:    QRS Duration: 95 QT Interval:  409 QTC Calculation: 426 R Axis:   62 Text Interpretation: Sinus rhythm no acute st/ts No significant change since 1/18 Confirmed by Aletta Edouard [phone]) on 05/25/2019 1:30:34 PM   Radiology DG Chest 2 View  Result Date: 05/25/2019 CLINICAL DATA:  Acute onset of chest pain. EXAM: CHEST - 2 VIEW COMPARISON:  03/31/2016 FINDINGS: The cardiac silhouette, mediastinal and hilar contours are normal and stable. No acute pulmonary findings. No pleural effusions. Small calcified granuloma again noted in the right upper lobe. The bony thorax is intact. IMPRESSION: No acute cardiopulmonary findings. Electronically Signed   By: Marijo Sanes M.D.   On: 05/25/2019 14:31    Procedures Procedures (including critical care time)  Medications Ordered in ED Medications  sodium  chloride flush (NS) 0.9 % injection 3 mL (3 mLs Intravenous Given 05/25/19 1359)  aspirin chewable tablet 324 mg (324 mg Oral Given 05/25/19 1405)    ED Course  I have reviewed the triage vital signs and the nursing notes.  Pertinent labs & imaging results that were available during my care of the patient were reviewed by me and considered in my medical decision making (see chart for details).  Clinical  Course as of May 26 1023  Sat May 25, 2019  1350 Differential includes ACS, GERD, muscular, pneumonia, pneumothorax, PE   [MB]  1434 Chest x-ray interpreted by me as no pneumothorax normal mediastinum no gross infiltrates.  Awaiting radiology reading.   [MB]  [id number] Patient's initial work-up including chest x-ray CBC chemistries and troponin fairly unremarkable other than a mildly elevated glucose of 109.  Awaiting delta troponin.   [MB]  [id number] Reviewed labs with patient including her delta troponin that is unchanged significantly.  She is comfortable going home.  She currently does not have a cardiologist anymore so we will do a referral to [facility] cardiology.  Patient comfortable with plan   [MB]    Clinical Course User Index [MB] Hayden Rasmussen, MD   MDM Rules/Calculators/A&P                       Final Clinical Impression(s) / ED Diagnoses Final diagnoses:  Nonspecific chest pain    Rx / DC Orders ED Discharge Orders    None       Hayden Rasmussen, MD 05/26/19 1025

## 2019-05-25 NOTE — ED Triage Notes (Signed)
Patient reports that she began having left chest pain 15 minutes ago, but pain has subsided some at this time. Patient denies SOB or nausea.  patient also c/o headache.

## 2019-06-04 ENCOUNTER — Encounter: Payer: Self-pay | Admitting: Family Medicine

## 2019-06-19 ENCOUNTER — Encounter: Payer: Self-pay | Admitting: General Practice

## 2019-06-25 ENCOUNTER — Ambulatory Visit (INDEPENDENT_AMBULATORY_CARE_PROVIDER_SITE_OTHER): Payer: Medicare Other | Admitting: Cardiovascular Disease

## 2019-06-25 ENCOUNTER — Other Ambulatory Visit: Payer: Self-pay

## 2019-06-25 ENCOUNTER — Encounter: Payer: Self-pay | Admitting: Cardiovascular Disease

## 2019-06-25 VITALS — BP 160/62 | HR 61 | Ht 62.0 in | Wt 150.0 lb

## 2019-06-25 DIAGNOSIS — H269 Unspecified cataract: Secondary | ICD-10-CM | POA: Insufficient documentation

## 2019-06-25 DIAGNOSIS — M810 Age-related osteoporosis without current pathological fracture: Secondary | ICD-10-CM | POA: Insufficient documentation

## 2019-06-25 DIAGNOSIS — L120 Bullous pemphigoid: Secondary | ICD-10-CM | POA: Diagnosis not present

## 2019-06-25 DIAGNOSIS — I10 Essential (primary) hypertension: Secondary | ICD-10-CM

## 2019-06-25 DIAGNOSIS — I2510 Atherosclerotic heart disease of native coronary artery without angina pectoris: Secondary | ICD-10-CM

## 2019-06-25 DIAGNOSIS — Z5181 Encounter for therapeutic drug level monitoring: Secondary | ICD-10-CM

## 2019-06-25 DIAGNOSIS — E7800 Pure hypercholesterolemia, unspecified: Secondary | ICD-10-CM

## 2019-06-25 HISTORY — DX: Essential (primary) hypertension: I10

## 2019-06-25 HISTORY — DX: Pure hypercholesterolemia, unspecified: E78.00

## 2019-06-25 HISTORY — DX: Atherosclerotic heart disease of native coronary artery without angina pectoris: I25.10

## 2019-06-25 HISTORY — DX: Bullous pemphigoid: L12.0

## 2019-06-25 MED ORDER — PRAVASTATIN SODIUM 40 MG PO TABS: 40.0000 mg | ORAL_TABLET | Freq: Every evening | ORAL | 3 refills | Status: DC

## 2019-06-25 MED ORDER — VALSARTAN 160 MG PO TABS: 160.0000 mg | ORAL_TABLET | Freq: Every day | ORAL | 1 refills | Status: DC

## 2019-06-25 NOTE — Patient Instructions (Signed)
Medication Instructions:  STOP LOSARTAN   START VALSARTAN 160 MG DAILY   INCREASE YOUR PRAVASTATIN TO 40 MG DAILY   *If you need a refill on your cardiac medications before your next appointment, please call your pharmacy*  Lab Work: BMET IN 1 WEEK   FASTING LP/CMET IN 3 MONTHS   If you have labs (blood work) drawn today and your tests are completely normal, you will receive your results only by: Marland Kitchen MyChart Message (if you have MyChart) OR . A paper copy in the mail If you have any lab test that is abnormal or we need to change your treatment, we will call you to review the results.  Testing/Procedures: NONE  Follow-Up: At [facility], you and your health needs are our priority.  As part of our continuing mission to provide you with exceptional heart care, we have created designated Provider Care Teams.  These Care Teams include your primary Cardiologist (physician) and Advanced Practice Providers (APPs -  Physician Assistants and Nurse Practitioners) who all work together to provide you with the care you need, when you need it.  We recommend signing up for the patient portal called "MyChart".  Sign up information is provided on this After Visit Summary.  MyChart is used to connect with patients for Virtual Visits (Telemedicine).  Patients are able to view lab/test results, encounter notes, upcoming appointments, etc.  Non-urgent messages can be sent to your provider as well.   To learn more about what you can do with MyChart, go to [email or web address].    Your next appointment:   4 week(s)  The format for your next appointment:   Either In Person or Virtual  Provider:   You may see DR [facility]  or one of the following Advanced Practice Providers on your designated Care Team:    Kerin Ransom, PA-C  [place], [place]  Coletta Memos, [place]

## 2019-06-25 NOTE — Progress Notes (Signed)
Cardiology Office Note   Date:  06/25/2019   ID:  [name], DOB 18-Mar-1941, MRN [id number]  PCP:  Marda Stalker, PA-C  Cardiologist:   Skeet Latch, MD   No chief complaint on file.   History of Present Illness: Sandra Wheeler is a 79 y.o. female with hypertension, hyperlipidemia, GERD, and seizures who presents for an evaluation of chest pain.  She was shopping when she suddenly developed tightness in her chest and back.  There is no associated shortness of breath, nausea, or diaphoresis.  The episode did not last long.  She decided to go to the emergency department due to her family history.  By the time she arrived her symptoms had resolved.  Cardiac testing was unremarkable and she was instructed to follow-up with cardiology.  In 2009 she saw Dr. Gwenlyn Wheeler due to chest pain.  She had a coronary CT-A that revealed a calcium score of 78.  There is calcified plaque noted in the RCA and distal left main.  There did not appear to be obstructive disease.  She did undergo left heart catheterization at that time.  The results are not currently available but the patient reports being told that everything was okay.  She did not require any stenting or surgery.    Ms. Sandra Wheeler gets a lot of exercise and does yard work.  She walks daily and lifts weights.  She was exercising more but sprained her L foot in September.  She hasn't had any chest pain with exertion.  She denies lower extremity edema, orthopnea or PND.  She has been very stressed for the last two months.  Her daughter is going through a difficult divorce.  Her BP has been more elevated during this time.  She notes that when her BP was 200s in the past she had bleeding in her retina and lost vision in L eye.  She struggles with salt and bread intake but her diet is otherwise very healthy.   Past Medical History:  Diagnosis Date  . Anginal pain ([place])   . Breast cyst, right   . 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 CAD in native artery  06/25/2019  . Cataract    unsure of which eye  . Colon polyps   . Environmental allergies   . Essential hypertension 06/25/2019  . Genetic testing 04/28/2017   Multi-Cancer panel (83 genes) @ [facility] - No pathogenic mutations detected  . GERD (gastroesophageal reflux disease)   . Glaucoma   . High cholesterol   . Hypertension   . IBS (irritable bowel syndrome)   . Osteoporosis 06/2016   T score -2.8  . PONV (postoperative nausea and vomiting)   . Pure hypercholesterolemia 06/25/2019  . 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 ABDOMINAL HYSTERECTOMY    . BLADDER SUSPENSION N/A 04/08/2016   Procedure: TRANSVAGINAL TAPE (TVT) PROCEDURE;  Surgeon: Princess Bruins, MD;  Location: [facility];  Service: Gynecology;  Laterality: N/A;  . BREAST SURGERY     cyst removal  . CARDIAC CATHETERIZATION    . CATARACT EXTRACTION, BILATERAL    . COLONOSCOPY    . CYSTOSCOPY N/A 04/08/2016   Procedure: CYSTOSCOPY;  Surgeon: Princess Bruins, MD;  Location: [facility];  Service: Gynecology;  Laterality: N/A;  . DENTAL SURGERY  03/31/2016  . ELBOW FRACTURE SURGERY    . EYE SURGERY Left    blood clot on retina  .  ROBOTIC ASSISTED TOTAL HYSTERECTOMY WITH BILATERAL SALPINGO OOPHERECTOMY Bilateral 04/08/2016   Procedure: ROBOTIC ASSISTED TOTAL HYSTERECTOMY WITH BILATERAL SALPINGO OOPHORECTOMY/Uterosacral Ligament Suspension;  Surgeon: Princess Bruins, MD;  Location: [facility];  Service: Gynecology;  Laterality: Bilateral;  Request 4 hrs.  . TUBAL LIGATION    . WRIST ARTHROSCOPY       Current Outpatient Medications  Medication Sig Dispense Refill  . ALPRAZolam (XANAX) 0.25 MG tablet Take 0.125-0.25 mg by mouth at bedtime as needed for sleep.     Marland Kitchen b complex vitamins capsule Take 1 capsule by mouth daily.    . Calcium-Vitamin D 500-125 MG-UNIT TABS Take 1 tablet by mouth daily.    . cholecalciferol (VITAMIN D3) 25 MCG (1000 UNIT)  tablet Take 1,000 Units by mouth daily.    . clobetasol ointment (TEMOVATE) [id number] % Apply 1 application topically 2 (two) times daily as needed (blisters).    . loratadine (ALLERGY) 10 MG tablet Take 10 mg by mouth at bedtime.    . Melatonin 1 MG CAPS Take 1 mg by mouth at bedtime.     . nitrofurantoin, macrocrystal-monohydrate, (MACROBID) 100 MG capsule Take 100 mg by mouth 2 (two) times daily.    . pravastatin (PRAVACHOL) 40 MG tablet Take 1 tablet (40 mg total) by mouth every evening. 90 tablet 3  . timolol (BETIMOL) 0.25 % ophthalmic solution Place 1 drop into both eyes 2 (two) times daily.     . vitamin C (ASCORBIC ACID) 250 MG tablet Take 250 mg by mouth daily.    Marland Kitchen Zoster Vaccine Adjuvanted [facility]) injection     . valsartan (DIOVAN) 160 MG tablet Take 1 tablet (160 mg total) by mouth daily. 90 tablet 1   No current facility-administered medications for this visit.    Allergies:   Codeine, Meloxicam, and Tramadol    Social History:  The patient  reports that she has never smoked. She has never used smokeless tobacco. She reports current alcohol use. She reports that she does not use drugs.   Family History:  The patient's family history includes Alcohol abuse in her maternal uncle; CAD in her mother; Cancer in her maternal grandmother and another family member; Congestive Heart Failure in her paternal grandfather and paternal uncle; Dementia in her paternal aunt; Diabetes in her father; Hearing loss in her mother; Heart disease in her father and maternal grandfather; Leukemia in her cousin; Macular degeneration in her mother; Other in her brother; Pancreatic cancer (age of onset: 60) in her brother; Pancreatic cancer (age of onset: 62) in her maternal uncle; Rheumatic fever in her father.    ROS:  Please see the history of present illness.   Otherwise, review of systems are positive for none.   All other systems are reviewed and negative.    PHYSICAL EXAM: VS:  BP (!) 160/62    Pulse 61   Ht 5\' 2"  (1.575 m)   Wt 150 lb (68 kg)   SpO2 97%   BMI 27.44 kg/m  , BMI Body mass index is 27.44 kg/m. GENERAL:  Well appearing HEENT:  Pupils equal round and reactive, fundi not visualized, oral mucosa unremarkable NECK:  No jugular venous distention, waveform within normal limits, carotid upstroke brisk and symmetric, no bruits LUNGS:  Clear to auscultation bilaterally HEART:  RRR.  PMI not displaced or sustained,S1 and S2 within normal limits, no S3, no S4, no clicks, no rubs, no murmurs ABD:  Flat, positive bowel sounds normal in frequency in pitch, no bruits, no  rebound, no guarding, no midline pulsatile mass, no hepatomegaly, no splenomegaly EXT:  2 plus pulses throughout, no edema, no cyanosis no clubbing SKIN:  No rashes no nodules NEURO:  Cranial nerves II through XII grossly intact, motor grossly intact throughout PSYCH:  Cognitively intact, oriented to person place and time  EKG:  EKG is not ordered today. The ekg ordered 05/27/19 demonstrates sinus rhythm.  Rate 65 bpm.  Recent Labs: 05/25/2019: BUN 20; Creatinine, Ser 0.66; Hemoglobin 13.8; Platelets 282; Potassium 4.1; Sodium 139    Lipid Panel No results Wheeler for: CHOL, TRIG, HDL, CHOLHDL, VLDL, LDLCALC, LDLDIRECT    Wt Readings from Last 3 Encounters:  06/25/19 150 lb (68 kg)  05/25/19 145 lb (65.8 kg)  04/08/19 148 lb (67.1 kg)      ASSESSMENT AND PLAN:  # Essential hypertension: Blood pressure is poorly controlled.  She thinks that stress is contributing.  We discussing limiting her sodium intake.  We will also switch losartan to valsartan 160mg  daily.  Check BMP in a week.  # Hyperlipidemia: LDL should be <70 given her CAD and prior stroke.  Increase pravastatin to 40mg .  Repeat lipids/CMP in 3 months.   # Chest pain:  # CAD: Ms. Burckhard has known, non-obstructive CAD.  However this episode of chest pain doesn't seem to be due to ischemia.  She exercises reguarly and has no exertional  symptoms.  It is quite possible that stress is contributing.  She will monitor closely and if she develops exertional symptoms we will proceed with additional test. Medical management as above.    Current medicines are reviewed at length with the patient today.  The patient does not have concerns regarding medicines.  The following changes have been made:  Switch losartan to valsartan and increase pravastatin.  Labs/ tests ordered today include:   Orders Placed This Encounter  Procedures  . Basic metabolic panel     Disposition:   FU with Tiffany C. Oval Linsey, MD, [facility] in 1 month.     Signed, Tiffany C. Oval Linsey, MD, [facility]  06/25/2019 2:17 PM    [facility]

## 2019-07-02 LAB — BASIC METABOLIC PANEL
BUN/Creatinine Ratio: 29 — ABNORMAL HIGH (ref 12–28)
BUN: 20 mg/dL (ref 8–27)
CO2: 24 mmol/L (ref 20–29)
Calcium: 9.1 mg/dL (ref 8.7–10.3)
Chloride: 105 mmol/L (ref 96–106)
Creatinine, Ser: 0.7 mg/dL (ref 0.57–1.00)
GFR calc Af Amer: 96 mL/min/{1.73_m2} (ref >59)
GFR calc non Af Amer: 83 mL/min/{1.73_m2} (ref >59)
Glucose: 110 mg/dL — ABNORMAL HIGH (ref 65–99)
Potassium: 4.2 mmol/L (ref 3.5–5.2)
Sodium: 142 mmol/L (ref 134–144)

## 2019-07-23 ENCOUNTER — Encounter: Payer: Self-pay | Admitting: Cardiology

## 2019-07-23 ENCOUNTER — Telehealth (INDEPENDENT_AMBULATORY_CARE_PROVIDER_SITE_OTHER): Payer: Medicare Other | Admitting: Cardiology

## 2019-07-23 VITALS — Ht 62.0 in | Wt 147.0 lb

## 2019-07-23 DIAGNOSIS — I10 Essential (primary) hypertension: Secondary | ICD-10-CM | POA: Diagnosis not present

## 2019-07-23 DIAGNOSIS — E7800 Pure hypercholesterolemia, unspecified: Secondary | ICD-10-CM | POA: Diagnosis not present

## 2019-07-23 DIAGNOSIS — Z79899 Other long term (current) drug therapy: Secondary | ICD-10-CM

## 2019-07-23 NOTE — Progress Notes (Addendum)
Virtual Visit via Telephone Note   This visit type was conducted due to national recommendations for restrictions regarding the COVID-19 Pandemic (e.g. social distancing) in an effort to limit this patient's exposure and mitigate transmission in our community.  Due to her co-morbid illnesses, this patient is at least at moderate risk for complications without adequate follow up.  This format is felt to be most appropriate for this patient at this time.  The patient did not have access to video technology/had technical difficulties with video requiring transitioning to audio format only (telephone).  All issues noted in this document were discussed and addressed.  No physical exam could be performed with this format.  Please refer to the patient's chart for her  consent to telehealth for [facility].   The patient was identified using 2 identifiers.  Date:  07/23/2019   ID:  Sandra Wheeler, DOB September 16, 1940, MRN [id number]  Patient Location: Home Provider Location: Home  PCP:  Marda Stalker, PA-C  Cardiologist:  Dr Oval Linsey Electrophysiologist:  None   Evaluation Performed:  Follow-Up Visit  Chief Complaint:  none  History of Present Illness:    Sandra Wheeler is a 79 y.o. female with a history of nonobstructive coronary disease in 2007, HTN,, prior CVA, and HLD.  She was seen by Dr Oval Linsey 06/25/2019 and her B/P was noted to be high.  The patient has been in under some stress with family issues. She exercises reguarly and has no exertional symptoms. Dr Oval Linsey changed her Losartan to valsartan and increased her Pravastatin in an effort to get her LDL < 70 (h/o CVA).  She was contacted to review her B/P but she told me she hadn't been following it.   The patient does not have symptoms concerning for COVID-19 infection (fever, chills, cough, or new shortness of breath).    Past Medical History:  Diagnosis Date  . Anginal pain ([place])   . Breast cyst, right   . Bullous pemphigoid  06/25/2019  . CAD in native artery 06/25/2019  . Cataract    unsure of which eye  . Colon polyps   . Environmental allergies   . Essential hypertension 06/25/2019  . Genetic testing 04/28/2017   Multi-Cancer panel (83 genes) @ [facility] - No pathogenic mutations detected  . GERD (gastroesophageal reflux disease)   . Glaucoma   . High cholesterol   . Hypertension   . IBS (irritable bowel syndrome)   . Osteoporosis 06/2016   T score -2.8  . PONV (postoperative nausea and vomiting)   . Pure hypercholesterolemia 06/25/2019  . 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 ABDOMINAL HYSTERECTOMY    . BLADDER SUSPENSION N/A 04/08/2016   Procedure: TRANSVAGINAL TAPE (TVT) PROCEDURE;  Surgeon: Princess Bruins, MD;  Location: [facility];  Service: Gynecology;  Laterality: N/A;  . BREAST SURGERY     cyst removal  . CARDIAC CATHETERIZATION    . CATARACT EXTRACTION, BILATERAL    . COLONOSCOPY    . CYSTOSCOPY N/A 04/08/2016   Procedure: CYSTOSCOPY;  Surgeon: Princess Bruins, MD;  Location: [facility];  Service: Gynecology;  Laterality: N/A;  . DENTAL SURGERY  03/31/2016  . ELBOW FRACTURE SURGERY    . EYE SURGERY Left    blood clot on retina  . ROBOTIC ASSISTED TOTAL HYSTERECTOMY WITH BILATERAL SALPINGO OOPHERECTOMY Bilateral 04/08/2016   Procedure: ROBOTIC ASSISTED TOTAL HYSTERECTOMY WITH BILATERAL SALPINGO OOPHORECTOMY/Uterosacral Ligament Suspension;  Surgeon: Princess Bruins, MD;  Location: [facility];  Service: Gynecology;  Laterality: Bilateral;  Request 4 hrs.  . TUBAL LIGATION    . WRIST ARTHROSCOPY       Current Meds  Medication Sig  . ALPRAZolam (XANAX) 0.25 MG tablet Take 0.125-0.25 mg by mouth at bedtime as needed for sleep.   Marland Kitchen b complex vitamins capsule Take 1 capsule by mouth daily.  . Calcium-Vitamin D 500-125 MG-UNIT TABS Take 1 tablet by mouth daily.  . cholecalciferol (VITAMIN D3) 25 MCG (1000 UNIT) tablet Take  1,000 Units by mouth daily.  . clobetasol ointment (TEMOVATE) [id number] % Apply 1 application topically 2 (two) times daily as needed (blisters).  . loratadine (ALLERGY) 10 MG tablet Take 10 mg by mouth at bedtime.  . Melatonin 1 MG CAPS Take 1 mg by mouth at bedtime.   . nitrofurantoin, macrocrystal-monohydrate, (MACROBID) 100 MG capsule Take 100 mg by mouth 2 (two) times daily.  . pravastatin (PRAVACHOL) 40 MG tablet Take 1 tablet (40 mg total) by mouth every evening.  . timolol (BETIMOL) 0.25 % ophthalmic solution Place 1 drop into both eyes 2 (two) times daily.   . valsartan (DIOVAN) 160 MG tablet Take 1 tablet (160 mg total) by mouth daily.  . vitamin C (ASCORBIC ACID) 250 MG tablet Take 250 mg by mouth daily.  Marland Kitchen Zoster Vaccine Adjuvanted [facility]) injection      Allergies:   Codeine, Meloxicam, and Tramadol   Social History   Tobacco Use  . Smoking status: Never Smoker  . Smokeless tobacco: Never Used  Substance Use Topics  . Alcohol use: Yes  . Drug use: No     Family Hx: The patient's family history includes Alcohol abuse in her maternal uncle; CAD in her mother; Cancer in her maternal grandmother and another family member; Congestive Heart Failure in her paternal grandfather and paternal uncle; Dementia in her paternal aunt; Diabetes in her father; Hearing loss in her mother; Heart disease in her father and maternal grandfather; Leukemia in her cousin; Macular degeneration in her mother; Other in her brother; Pancreatic cancer (age of onset: 65) in her brother; Pancreatic cancer (age of onset: 31) in her maternal uncle; Rheumatic fever in her father.  ROS:   Please see the history of present illness.    All other systems reviewed and are negative.   Prior CV studies:   The following studies were reviewed today:    Labs/Other Tests and Data Reviewed:    EKG:  An ECG dated 05/27/2019 was personally reviewed today and demonstrated:  NSR  Recent Labs: 05/25/2019: Hemoglobin  13.8; Platelets 282 07/02/2019: BUN 20; Creatinine, Ser 0.70; Potassium 4.2; Sodium 142   Recent Lipid Panel No results found for: CHOL, TRIG, HDL, CHOLHDL, LDLCALC, LDLDIRECT  Wt Readings from Last 3 Encounters:  07/23/19 147 lb (66.7 kg)  06/25/19 150 lb (68 kg)  05/25/19 145 lb (65.8 kg)     Objective:    Vital Signs:  Ht 5\' 2"  (1.575 m)   Wt 147 lb (66.7 kg)   BMI 26.89 kg/m    VITAL SIGNS:  reviewed  ASSESSMENT & PLAN:    HTN- Meds adjusted LOV- she hasn't checked her B/P  HLD- Meds adjusted LOV- f/u labs pending  CAD- Non obstructive CAD at cath in 2007  Plan- I have asked her to check her B/P and let us know the readings.  F/U labs as ordered.  COVID-19 Education: The signs and symptoms of COVID-19 were discussed  with the patient and how to seek care for testing (follow up with PCP or arrange E-visit).  The importance of social distancing was discussed today.  Time:   Today, I have spent 10 minutes with the patient with telehealth technology discussing the above problems.     Medication Adjustments/Labs and Tests Ordered: Current medicines are reviewed at length with the patient today.  Concerns regarding medicines are outlined above.   Tests Ordered: No orders of the defined types were placed in this encounter.   Medication Changes: No orders of the defined types were placed in this encounter.   Follow Up:  In Person in 6 months with Dr Oval Linsey  Signed, Kerin Ransom, PA-C  07/23/2019 8:39 AM    [place]

## 2019-07-23 NOTE — Patient Instructions (Addendum)
Medication Instructions:  Continue current medications  *If you need a refill on your cardiac medications before your next appointment, please call your pharmacy*   Lab Work: None Ordered   Testing/Procedures: Fasting Lipid and BMP   Follow-Up: At [facility], you and your health needs are our priority.  As part of our continuing mission to provide you with exceptional heart care, we have created designated Provider Care Teams.  These Care Teams include your primary Cardiologist (physician) and Advanced Practice Providers (APPs -  Physician Assistants and Nurse Practitioners) who all work together to provide you with the care you need, when you need it.  We recommend signing up for the patient portal called "MyChart".  Sign up information is provided on this After Visit Summary.  MyChart is used to connect with patients for Virtual Visits (Telemedicine).  Patients are able to view lab/test results, encounter notes, upcoming appointments, etc.  Non-urgent messages can be sent to your provider as well.   To learn more about what you can do with MyChart, go to [email or web address].    Your next appointment:   6 month(s)  The format for your next appointment:   In Person  Provider:   You may see Skeet Latch, MD or one of the following Advanced Practice Providers on your designated Care Team:    Kerin Ransom, PA-C  [place], [place]  Coletta Memos, [place]

## 2019-08-08 LAB — BASIC METABOLIC PANEL WITH GFR
BUN/Creatinine Ratio: 19 (ref 12–28)
BUN: 15 mg/dL (ref 8–27)
CO2: 24 mmol/L (ref 20–29)
Calcium: 8.8 mg/dL (ref 8.7–10.3)
Chloride: 103 mmol/L (ref 96–106)
Creatinine, Ser: 0.79 mg/dL (ref 0.57–1.00)
GFR calc Af Amer: 83 mL/min/1.73
GFR calc non Af Amer: 72 mL/min/1.73
Glucose: 103 mg/dL — ABNORMAL HIGH (ref 65–99)
Potassium: 4.3 mmol/L (ref 3.5–5.2)
Sodium: 141 mmol/L (ref 134–144)

## 2019-08-08 LAB — LIPID PANEL
Chol/HDL Ratio: 3 ratio (ref 0.0–4.4)
Cholesterol, Total: 161 mg/dL (ref 100–199)
HDL: 54 mg/dL (ref >39)
LDL Chol Calc (NIH): 85 mg/dL (ref 0–99)
Triglycerides: 122 mg/dL (ref 0–149)
VLDL Cholesterol Cal: 22 mg/dL (ref 5–40)

## 2019-08-09 ENCOUNTER — Other Ambulatory Visit: Payer: Self-pay

## 2019-08-09 DIAGNOSIS — Z79899 Other long term (current) drug therapy: Secondary | ICD-10-CM

## 2019-08-09 DIAGNOSIS — E7800 Pure hypercholesterolemia, unspecified: Secondary | ICD-10-CM

## 2019-08-27 NOTE — Telephone Encounter (Signed)
PROLIA GIVEN 04/16/2019 NEXT INJECTION 10/15/2019

## 2019-09-11 ENCOUNTER — Telehealth: Payer: Self-pay | Admitting: *Deleted

## 2019-09-11 NOTE — Telephone Encounter (Signed)
Prolia insurance verification has been sent awaiting Summary of benefits  

## 2019-10-01 LAB — LIPID PANEL
Chol/HDL Ratio: 3.3 ratio (ref 0.0–4.4)
Cholesterol, Total: 168 mg/dL (ref 100–199)
HDL: 51 mg/dL (ref >39)
LDL Chol Calc (NIH): 95 mg/dL (ref 0–99)
Triglycerides: 126 mg/dL (ref 0–149)
VLDL Cholesterol Cal: 22 mg/dL (ref 5–40)

## 2019-10-07 NOTE — Telephone Encounter (Addendum)
Benefits obtained through [place] $800($865met)  OOP MAX $1,700(620.6met)  Annual exam 04/08/2019  Calcium   8.8          Date 08/08/2019  Upcoming dental procedures   Prior Authorization needed NO   Pt estimated Cost $112   APPT 10/29/2019    Coverage Details:10% of one dose, 10% admin fee

## 2019-10-29 ENCOUNTER — Other Ambulatory Visit: Payer: Self-pay

## 2019-10-29 ENCOUNTER — Encounter: Payer: Self-pay | Admitting: Obstetrics & Gynecology

## 2019-10-29 ENCOUNTER — Ambulatory Visit (INDEPENDENT_AMBULATORY_CARE_PROVIDER_SITE_OTHER): Payer: Medicare Other | Admitting: Anesthesiology

## 2019-10-29 DIAGNOSIS — M810 Age-related osteoporosis without current pathological fracture: Secondary | ICD-10-CM

## 2019-10-29 MED ORDER — DENOSUMAB 60 MG/ML ~~LOC~~ SOSY
60.0000 mg | PREFILLED_SYRINGE | Freq: Once | SUBCUTANEOUS | Status: AC
  Administered 2019-10-29: 60 mg via SUBCUTANEOUS

## 2019-10-30 ENCOUNTER — Other Ambulatory Visit: Payer: Self-pay

## 2019-10-30 ENCOUNTER — Telehealth: Payer: Self-pay | Admitting: Cardiovascular Disease

## 2019-10-30 DIAGNOSIS — I2510 Atherosclerotic heart disease of native coronary artery without angina pectoris: Secondary | ICD-10-CM

## 2019-10-30 DIAGNOSIS — E7800 Pure hypercholesterolemia, unspecified: Secondary | ICD-10-CM

## 2019-10-30 MED ORDER — EZETIMIBE 10 MG PO TABS: 10.0000 mg | ORAL_TABLET | Freq: Every day | ORAL | 3 refills | Status: DC

## 2019-10-30 NOTE — Telephone Encounter (Signed)
Spoke to patient lab results given.Advised to continue Pravastatin and start Zetia 10 mg daily.Prescription sent to pharmacy.Advised to repeat lipid panel in 2 months.Lab order mailed.

## 2019-10-30 NOTE — Telephone Encounter (Signed)
Patient is returning call to discuss results from lab work completed on 10/01/19.

## 2019-12-11 ENCOUNTER — Other Ambulatory Visit: Payer: Self-pay | Admitting: Cardiovascular Disease

## 2020-01-24 ENCOUNTER — Other Ambulatory Visit: Payer: Self-pay | Admitting: Cardiovascular Disease

## 2020-02-07 NOTE — Telephone Encounter (Signed)
PROLIA GIVEN 10/29/2019 NEXT INJECTION 05/02/2019

## 2020-03-09 ENCOUNTER — Encounter: Payer: Medicare Other | Admitting: Obstetrics & Gynecology

## 2020-04-08 ENCOUNTER — Encounter: Payer: Medicare Other | Admitting: Obstetrics & Gynecology

## 2020-04-09 ENCOUNTER — Other Ambulatory Visit: Payer: Self-pay

## 2020-04-09 ENCOUNTER — Encounter: Payer: Self-pay | Admitting: Obstetrics & Gynecology

## 2020-04-09 ENCOUNTER — Ambulatory Visit (INDEPENDENT_AMBULATORY_CARE_PROVIDER_SITE_OTHER): Payer: Medicare Other | Admitting: Obstetrics & Gynecology

## 2020-04-09 VITALS — BP 120/70 | Ht 61.5 in | Wt 143.0 lb

## 2020-04-09 DIAGNOSIS — Z90710 Acquired absence of both cervix and uterus: Secondary | ICD-10-CM

## 2020-04-09 DIAGNOSIS — Z01419 Encounter for gynecological examination (general) (routine) without abnormal findings: Secondary | ICD-10-CM | POA: Diagnosis not present

## 2020-04-09 DIAGNOSIS — Z780 Asymptomatic menopausal state: Secondary | ICD-10-CM

## 2020-04-09 DIAGNOSIS — M810 Age-related osteoporosis without current pathological fracture: Secondary | ICD-10-CM

## 2020-04-09 NOTE — Progress Notes (Signed)
Sandra Wheeler 1941-02-04 [id number]   History:    80 y.o. G1P1L1 Married.  Now lives at [facility].  [id number] presenting for annual gyn exam   [id number] of total hysterectomy. Postmenopausal, well on no hormone replacement therapy. No pelvic pain.  Currently abstinent. Urine normal, no SUIand bowel movements normal. Breasts normal. Screening mammo 09/2019. Body mass index 26.58. Osteoporosis on Prolia.  Bone Density 08/2018 improved.Health labs with family physician. Colonoscopy 2012, no further Colono per Dr Collene Mares.    Past medical history,surgical history, family history and social history were all reviewed and documented in the EPIC chart.  Gynecologic History No LMP recorded. Patient has had a hysterectomy.  Obstetric History OB History  Gravida Para Term Preterm AB Living  1 1       1   SAB IAB Ectopic Multiple Live Births               # Outcome Date GA Lbr Len/2nd Weight Sex Delivery Anes PTL Lv  1 Para              ROS: A ROS was performed and pertinent positives and negatives are included in the history.  GENERAL: No fevers or chills. HEENT: No change in vision, no earache, sore throat or sinus congestion. NECK: No pain or stiffness. CARDIOVASCULAR: No chest pain or pressure. No palpitations. PULMONARY: No shortness of breath, cough or wheeze. GASTROINTESTINAL: No abdominal pain, nausea, vomiting or diarrhea, melena or bright red blood per rectum. GENITOURINARY: No urinary frequency, urgency, hesitancy or dysuria. MUSCULOSKELETAL: No joint or muscle pain, no back pain, no recent trauma. DERMATOLOGIC: No rash, no itching, no lesions. ENDOCRINE: No polyuria, polydipsia, no heat or cold intolerance. No recent change in weight. HEMATOLOGICAL: No anemia or easy bruising or bleeding. NEUROLOGIC: No headache, seizures, numbness, tingling or weakness. PSYCHIATRIC: No depression, no loss of interest in normal activity or change in sleep pattern.      Exam:   BP 120/70   Ht 5' 1.5" (1.562 m)   Wt 143 lb (64.9 kg)   BMI 26.58 kg/m   Body mass index is 26.58 kg/m.  General appearance : Well developed well nourished female. No acute distress HEENT: Eyes: no retinal hemorrhage or exudates,  Neck supple, trachea midline, no carotid bruits, no thyroidmegaly Lungs: Clear to auscultation, no rhonchi or wheezes, or rib retractions  Heart: Regular rate and rhythm, no murmurs or gallops Breast:Examined in sitting and supine position were symmetrical in appearance, no palpable masses or tenderness,  no skin retraction, no nipple inversion, no nipple discharge, no skin discoloration, no axillary or supraclavicular lymphadenopathy Abdomen: no palpable masses or tenderness, no rebound or guarding Extremities: no edema or skin discoloration or tenderness  Pelvic: Vulva: Normal             Vagina: No gross lesions or discharge  Cervix/Uterus absent  Adnexa  Without masses or tenderness  Anus: Normal   Assessment/Plan:  80 y.o. female for annual exam   1. Well female exam with routine gynecological exam Gynecologic exam s/p Total Hysterectomy.  No indication to repeat Pap tests at this time.  Breast exam normal.  Screening mammo at [facility] 09/2019, will obtain report.  No screening Colono anymore.  Health labs with Fam MD.  2. S/P total hysterectomy  3. Postmenopause Well on no HRT.    4. 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  Will repeat BD in 08/2020.  If showing improvement again, would  like to switch to Fosamax for cost issues.  Vit D supplements/Ca++ 1500 mg daily, regular weight bearing physical activities. - DG Bone Density; Future  Princess Bruins MD, 9:36 AM 04/09/2020

## 2020-05-05 ENCOUNTER — Telehealth: Payer: Self-pay | Admitting: *Deleted

## 2020-05-05 NOTE — Telephone Encounter (Addendum)
Deductible $800 $209.26met)    OOP MAX  $1700( $232.29met)  Annual exam 04/09/2020  Calcium 8.8            Date 08/08/2019  Upcoming dental procedures   Prior Authorization needed NO PA NEEDED per [facility] referance #[id number]  Pt estimated Cost $700  APPT 05/11/2020     Coverage Details: 10% one dose, 10% admin fee

## 2020-05-11 ENCOUNTER — Ambulatory Visit: Payer: Medicare Other

## 2020-05-27 ENCOUNTER — Other Ambulatory Visit: Payer: Self-pay

## 2020-05-27 ENCOUNTER — Ambulatory Visit (INDEPENDENT_AMBULATORY_CARE_PROVIDER_SITE_OTHER): Payer: Medicare Other | Admitting: *Deleted

## 2020-05-27 DIAGNOSIS — M810 Age-related osteoporosis without current pathological fracture: Secondary | ICD-10-CM | POA: Diagnosis not present

## 2020-05-27 MED ORDER — DENOSUMAB 60 MG/ML ~~LOC~~ SOSY
60.0000 mg | PREFILLED_SYRINGE | Freq: Once | SUBCUTANEOUS | Status: AC
  Administered 2020-05-27: 60 mg via SUBCUTANEOUS

## 2020-05-30 ENCOUNTER — Other Ambulatory Visit: Payer: Self-pay | Admitting: Cardiovascular Disease

## 2020-06-11 ENCOUNTER — Other Ambulatory Visit: Payer: Self-pay | Admitting: Cardiovascular Disease

## 2020-06-30 ENCOUNTER — Telehealth: Payer: Self-pay | Admitting: Cardiovascular Disease

## 2020-06-30 NOTE — Telephone Encounter (Signed)
06/30/20 LVM on cell to schedule 1 yr fu w/Dr. Hoy Morn

## 2020-08-13 ENCOUNTER — Telehealth: Payer: Self-pay | Admitting: Cardiovascular Disease

## 2020-08-13 DIAGNOSIS — Z5181 Encounter for therapeutic drug level monitoring: Secondary | ICD-10-CM

## 2020-08-13 DIAGNOSIS — I10 Essential (primary) hypertension: Secondary | ICD-10-CM

## 2020-08-13 NOTE — Telephone Encounter (Signed)
Spoke with pt she states that [facility] lives at [facility] and "they have a nurse take her BP on tuesdays"  And she told her that her BP was running high but she is unable to provide me with the BP numbers. I informed pt that we cannot adjust/change her medication without these numbers.Suggested she purchase a BP cuff and take and log her BP 1-2 hours after taking her medication.  Verbalized understanding.   Pt called back to provide BP numbers:  06-30-20 150/70 07-14-20  160/68 07-21-20  140/70  08-04-20 150/68 Please advise

## 2020-08-13 NOTE — Telephone Encounter (Signed)
Pt c/o medication issue: 1. Name of Medication: Valsartan 2. How are you currently taking this medication (dosage and times per day)? 1 time a day 3. Are you having a reaction (difficulty breathing--STAT)?  No  4. What is your medication issue? States medication is not helping her and she something else her BP for the last month has been high

## 2020-08-14 MED ORDER — VALSARTAN 320 MG PO TABS: 320.0000 mg | ORAL_TABLET | Freq: Every day | ORAL | 1 refills | Status: DC

## 2020-08-14 NOTE — Telephone Encounter (Signed)
Dr Oval Linsey reviewed and will have patient increase Valsartan to 360 mg daily, BMET in 1 week  Advised patient, verbalized understanding

## 2020-09-11 NOTE — Telephone Encounter (Signed)
Mych[place] message sent to patient reminding of labs needing to be done

## 2020-10-02 ENCOUNTER — Other Ambulatory Visit: Payer: Self-pay | Admitting: Cardiovascular Disease

## 2020-10-13 LAB — BASIC METABOLIC PANEL
BUN/Creatinine Ratio: 24 (ref 12–28)
BUN: 18 mg/dL (ref 8–27)
CO2: 24 mmol/L (ref 20–29)
Calcium: 8.7 mg/dL (ref 8.7–10.3)
Chloride: 107 mmol/L — ABNORMAL HIGH (ref 96–106)
Creatinine, Ser: 0.74 mg/dL (ref 0.57–1.00)
Glucose: 97 mg/dL (ref 65–99)
Potassium: 4.2 mmol/L (ref 3.5–5.2)
Sodium: 144 mmol/L (ref 134–144)
eGFR: 82 mL/min/{1.73_m2} (ref >59)

## 2020-10-28 ENCOUNTER — Telehealth: Payer: Self-pay | Admitting: *Deleted

## 2020-10-28 NOTE — Telephone Encounter (Addendum)
Deductible $800 ($393.95 MET)  OOP MAX $1700 ($437.7 met)  Annual exam 04/09/2020  Calcium  8.7           Date 10/12/2020  Upcoming dental procedures NO  Prior Authorization needed YES per [employer] when I called and spoke to [facility] they stated it does not require Prior auth Reference # [id number]  Pt estimated Cost $516  11/26/2020    Coverage Details: 10% one dose, 10% admin fee

## 2020-10-28 NOTE — Telephone Encounter (Signed)
[place] 05/27/2020 NEXT INJECTION 11/28/2020

## 2020-11-10 NOTE — Telephone Encounter (Signed)
Called pt several times. Left message. I will send letter out and close encounter

## 2020-11-13 ENCOUNTER — Ambulatory Visit (INDEPENDENT_AMBULATORY_CARE_PROVIDER_SITE_OTHER): Payer: Medicare Other | Admitting: Cardiovascular Disease

## 2020-11-13 ENCOUNTER — Other Ambulatory Visit: Payer: Self-pay

## 2020-11-13 ENCOUNTER — Encounter (HOSPITAL_BASED_OUTPATIENT_CLINIC_OR_DEPARTMENT_OTHER): Payer: Self-pay | Admitting: Cardiovascular Disease

## 2020-11-13 VITALS — BP 154/62 | HR 57 | Resp 20 | Ht 62.0 in | Wt 144.8 lb

## 2020-11-13 DIAGNOSIS — I10 Essential (primary) hypertension: Secondary | ICD-10-CM | POA: Diagnosis not present

## 2020-11-13 DIAGNOSIS — I2510 Atherosclerotic heart disease of native coronary artery without angina pectoris: Secondary | ICD-10-CM | POA: Diagnosis not present

## 2020-11-13 MED ORDER — AMLODIPINE BESYLATE 5 MG PO TABS: 5.0000 mg | ORAL_TABLET | Freq: Every day | ORAL | 3 refills | Status: DC

## 2020-11-13 NOTE — Progress Notes (Signed)
Cardiology Office Note   Date:  11/13/2020   ID:  AMIELLE PATIENT, DOB 22-Sep-1940, MRN [id number]  PCP:  Marda Stalker, PA-C  Cardiologist:   Skeet Latch, MD   No chief complaint on file.   History of Present Illness: Sandra Wheeler is a 80 y.o. female with hypertension, hyperlipidemia, GERD, and seizures who presents for follow up.  SHe was first seen for an evaluation of chest pain 06/2019.  She was shopping when she suddenly developed tightness in her chest and back.  There is no associated shortness of breath, nausea, or diaphoresis.  The episode did not last long.  She decided to go to the emergency department due to her family history.  By the time she arrived her symptoms had resolved.  Cardiac testing was unremarkable and she was instructed to follow-up with cardiology.  In 2009 she saw Dr. Gwenlyn Found due to chest pain.  She had a coronary CT-A that revealed a calcium score of 78.  There is calcified plaque noted in the RCA and distal left main.  There did not appear to be obstructive disease.  She did undergo left heart catheterization at that time.  The results are not currently available but the patient reports being told that everything was okay.  She did not require any stenting or surgery.    Since her last appointment she and her husband moved to [facility].  So far she likes it there and the poor family.  She participates in exercise classes.  She started doing a yoga class and also does weight training 2-3 times per week.  She has no exertional chest pain or shortness of breath.  She denies lower extremity edema, orthopnea, or PND.  When they check her blood pressure lately it has been running in the 150s.  At her last appointment her blood pressure was poorly controlled.  She thought the stress was contributing.  Losartan was switched to valsartan.  She also reported chest pain which was thought to be atypical so no further ischemic evaluation occurred at the time.  She  had a virtual follow-up with Kerin Ransom, PA the following month and had not been tracking her blood pressures.  She was otherwise feeling well.  Since that time she was started on Zetia due to lipids not being at goal.  She called our office 07/2020 with because her blood pressures were not well-controlled.  Valsartan was increased to 360 mg.  Her family recently went on a trip to [place].  She has been less stressed lately because her daughters divorce has been finalized.   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   CAD in native artery 06/25/2019   Cataract    unsure of which eye   Colon polyps    Environmental allergies    Essential hypertension 06/25/2019   Genetic testing 04/28/2017   Multi-Cancer panel (83 genes) @ [facility] - No pathogenic mutations detected   GERD (gastroesophageal reflux disease)    Glaucoma    High cholesterol    Hypertension    IBS (irritable bowel syndrome)    Osteoporosis 06/2016   T score -2.8   PONV (postoperative nausea and vomiting)    Pure hypercholesterolemia 06/25/2019   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, 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    Past Surgical History:  Procedure Laterality Date   ABDOMINAL HYSTERECTOMY  BLADDER SUSPENSION N/A 04/08/2016   Procedure: TRANSVAGINAL TAPE (TVT) PROCEDURE;  Surgeon: Princess Bruins, MD;  Location: [facility];  Service: Gynecology;  Laterality: N/A;   BREAST SURGERY     cyst removal   CARDIAC CATHETERIZATION     CATARACT EXTRACTION, BILATERAL     COLONOSCOPY     CYSTOSCOPY N/A 04/08/2016   Procedure: CYSTOSCOPY;  Surgeon: Princess Bruins, MD;  Location: [facility];  Service: Gynecology;  Laterality: N/A;   DENTAL SURGERY  03/31/2016   ELBOW FRACTURE SURGERY     EYE SURGERY Left    blood clot on retina   ROBOTIC ASSISTED TOTAL HYSTERECTOMY WITH BILATERAL SALPINGO OOPHERECTOMY Bilateral 04/08/2016   Procedure: ROBOTIC ASSISTED TOTAL HYSTERECTOMY  WITH BILATERAL SALPINGO OOPHORECTOMY/Uterosacral Ligament Suspension;  Surgeon: Princess Bruins, MD;  Location: [facility];  Service: Gynecology;  Laterality: Bilateral;  Request 4 hrs.   TUBAL LIGATION     WRIST ARTHROSCOPY       Current Outpatient Medications  Medication Sig Dispense Refill   ALPRAZolam (XANAX) 0.25 MG tablet Take 0.125-0.25 mg by mouth at bedtime as needed for sleep.      b complex vitamins capsule Take 1 capsule by mouth daily.     Calcium-Vitamin D 500-125 MG-UNIT TABS Take 1 tablet by mouth daily.     cholecalciferol (VITAMIN D3) 25 MCG (1000 UNIT) tablet Take 1,000 Units by mouth daily.     escitalopram (LEXAPRO) 5 MG tablet Take 5 mg by mouth daily.     ezetimibe (ZETIA) 10 MG tablet TAKE ONE TABLET BY MOUTH ONE TIME DAILY 90 tablet 0   loratadine (CLARITIN) 10 MG tablet Take 10 mg by mouth at bedtime.     Melatonin 1 MG CAPS Take 1 mg by mouth at bedtime.      pravastatin (PRAVACHOL) 40 MG tablet Take 1 tablet by mouth daily in the evening 90 tablet 0   timolol (BETIMOL) 0.25 % ophthalmic solution Place 1 drop into both eyes 2 (two) times daily.      valsartan (DIOVAN) 320 MG tablet Take 1 tablet (320 mg total) by mouth daily. 90 tablet 1   vitamin C (ASCORBIC ACID) 250 MG tablet Take 250 mg by mouth daily.     Zoster Vaccine Adjuvanted [facility]) injection      No current facility-administered medications for this visit.    Allergies:   Codeine, Meloxicam, and Tramadol    Social History:  The patient  reports that she has never smoked. She has never used smokeless tobacco. She reports current alcohol use. She reports that she does not use drugs.   Family History:  The patient's family history includes Alcohol abuse in her maternal uncle; CAD in her mother; Cancer in her maternal grandmother and another family member; Congestive Heart Failure in her paternal grandfather and paternal uncle; Dementia in her paternal aunt; Diabetes in her father; Hearing loss in her  mother; Heart disease in her father and maternal grandfather; Leukemia in her cousin; Macular degeneration in her mother; Other in her brother; Pancreatic cancer (age of onset: 12) in her brother; Pancreatic cancer (age of onset: 31) in her maternal uncle; Rheumatic fever in her father.    ROS:  Please see the history of present illness.   Otherwise, review of systems are positive for none.   All other systems are reviewed and negative.    PHYSICAL EXAM: VS:  BP 128/68   Pulse (!) 57   Resp 20   Ht '5\' 2"'$  (1.575 m)  Wt 144 lb 12.8 oz (65.7 kg)   SpO2 96%   BMI 26.48 kg/m  , BMI Body mass index is 26.48 kg/m. GENERAL:  Well appearing HEENT:  Pupils equal round and reactive, fundi not visualized, oral mucosa unremarkable NECK:  No jugular venous distention, waveform within normal limits, carotid upstroke brisk and symmetric, no bruits LUNGS:  Clear to auscultation bilaterally HEART:  RRR.  PMI not displaced or sustained,S1 and S2 within normal limits, no S3, no S4, no clicks, no rubs, no murmurs ABD:  Flat, positive bowel sounds normal in frequency in pitch, no bruits, no rebound, no guarding, no midline pulsatile mass, no hepatomegaly, no splenomegaly EXT:  2 plus pulses throughout, no edema, no cyanosis no clubbing SKIN:  No rashes no nodules NEURO:  Cranial nerves II through XII grossly intact, motor grossly intact throughout PSYCH:  Cognitively intact, oriented to person place and time  EKG:  EKG is ordered today. The ekg ordered 05/27/19 demonstrates sinus rhythm.  Rate 65 bpm. 11/13/2020: Sinus bradycardia.  Rate 57 bpm.  Recent Labs: 10/12/2020: BUN 18; Creatinine, Ser 0.74; Potassium 4.2; Sodium 144    Lipid Panel    Component Value Date/Time   CHOL 168 10/01/2019 0809   TRIG 126 10/01/2019 0809   HDL 51 10/01/2019 0809   CHOLHDL 3.3 10/01/2019 0809   LDLCALC 95 10/01/2019 0809      Wt Readings from Last 3 Encounters:  11/13/20 144 lb 12.8 oz (65.7 kg)  04/09/20 143  lb (64.9 kg)  07/23/19 147 lb (66.7 kg)      ASSESSMENT AND PLAN:  # Essential hypertension: Blood pressure is poorly controlled both here and at home.  She will continue valsartan and we will add amlodipine 5 mg daily.  Keep checking blood pressures and bring to follow-up with pharmacist in 2 months.   # Hyperlipidemia: LDL should be <70 given her CAD and prior stroke.  Her LDL goal is less than 70.  Continue with exercise and diet.  Continue pravastatin and Zetia.  # CAD: Ms. Ponti has known, non-obstructive CAD.  She is medically managed and has no symptoms at this time.  Continue with regular exercise.   Current medicines are reviewed at length with the patient today.  The patient does not have concerns regarding medicines.  The following changes have been made:  Switch losartan to valsartan and increase pravastatin.  Labs/ tests ordered today include:   No orders of the defined types were placed in this encounter.    Disposition:   FU with Destina Mantei C. Oval Linsey, MD, [facility] in 1 year. PharmD in 1-2 months.     Signed, Arly Salminen C. Oval Linsey, MD, [facility]  11/13/2020 9:42 AM    [place]

## 2020-11-13 NOTE — Patient Instructions (Signed)
Medication Instructions:  START AMLODIPINE 5 MG DAILY   *If you need a refill on your cardiac medications before your next appointment, please call your pharmacy*  Lab Work: NONE   Testing/Procedures: NONE   Follow-Up: At [facility], you and your health needs are our priority.  As part of our continuing mission to provide you with exceptional heart care, we have created designated Provider Care Teams.  These Care Teams include your primary Cardiologist (physician) and Advanced Practice Providers (APPs -  Physician Assistants and Nurse Practitioners) who all work together to provide you with the care you need, when you need it.  We recommend signing up for the patient portal called "MyChart".  Sign up information is provided on this After Visit Summary.  MyChart is used to connect with patients for Virtual Visits (Telemedicine).  Patients are able to view lab/test results, encounter notes, upcoming appointments, etc.  Non-urgent messages can be sent to your provider as well.   To learn more about what you can do with MyChart, go to [email or web address].    Your next appointment:   12 month(s)  The format for your next appointment:   In Person  Provider:   Skeet Latch, MD  Your physician recommends that you schedule a follow-up appointment in 1-2 [place] [facility] [place] [facility]

## 2020-11-26 ENCOUNTER — Ambulatory Visit (INDEPENDENT_AMBULATORY_CARE_PROVIDER_SITE_OTHER): Payer: Medicare Other | Admitting: *Deleted

## 2020-11-26 ENCOUNTER — Other Ambulatory Visit: Payer: Self-pay

## 2020-11-26 DIAGNOSIS — M810 Age-related osteoporosis without current pathological fracture: Secondary | ICD-10-CM | POA: Diagnosis not present

## 2020-11-26 MED ORDER — DENOSUMAB 60 MG/ML ~~LOC~~ SOSY
60.0000 mg | PREFILLED_SYRINGE | Freq: Once | SUBCUTANEOUS | Status: AC
  Administered 2020-11-26: 60 mg via SUBCUTANEOUS

## 2020-12-15 ENCOUNTER — Ambulatory Visit (INDEPENDENT_AMBULATORY_CARE_PROVIDER_SITE_OTHER): Payer: Medicare Other | Admitting: Pharmacist Clinician (PhC)/ Clinical Pharmacy Specialist

## 2020-12-15 ENCOUNTER — Other Ambulatory Visit: Payer: Self-pay

## 2020-12-15 DIAGNOSIS — I10 Essential (primary) hypertension: Secondary | ICD-10-CM

## 2020-12-15 MED ORDER — AMLODIPINE BESYLATE 10 MG PO TABS: 10.0000 mg | ORAL_TABLET | Freq: Every day | ORAL | 3 refills | Status: DC

## 2020-12-15 NOTE — Assessment & Plan Note (Signed)
Patient with essential hypertension, still not at BP goal.  Will increase amlodipine to 10 mg daily, and have her continue with weekly BP checks at [facility].  Return in 2 months for follow up, but patient aware to call before then should she have any concerns or problems with medications.

## 2020-12-15 NOTE — Progress Notes (Signed)
12/15/2020 Sandra Wheeler 09/04/1940 [id number]   HPI:  Sandra Wheeler is a 80 y.o. female patient of Dr Oval Linsey, with a [place] below who presents today for hypertension clinic evaluation.  At her last visit with Dr. Oval Linsey her pressure was noted to be 154/62 and amlodipine 5 mg daily was added to her medications.  She noted at that time that her stress levels had dropped.      Past Medical History: ASCVD Non obstructive, calcium score 78, with calcified plaque in RCA and distal left main  hyperlipidemia LDL < 70 d/t hx of CAD and prior stroke; 1/22 LDL 76 on pravastatin 40      Blood Pressure Goal:  130/80  Current Medications:  valsartan 320 mg qd, amlodipine 5 mg qd  Family Hx: father died at 65 - DM, CHF, brother died at 54, pancreatic cancer, mom lived to be 33, has one daughter with hypertension  Social Hx: no tobacco, wine or mixed drink most days before dinner; no caffeine  Diet: lives at [facility], eats in dining room usually once daily; good mix of fruits and vegetables (usually fresh and roasted); some chips, (usually vegetable chips), usually low salt; no added salt regularly  Exercise: 2-3 days per week goes to exercise class, chair yoga, meditation/relaxation; also spends time most days with 3 teenage grandchildren, running to appointments/practices  Home BP readings:  8 readings average 142/75  (range 120-154/62-96)  HR average 63  Intolerances: no cardiac medication intolerances  Labs: 7/22:  Na 144,K 4.2, Glu 97, BUN 18, SCr 0.74 GFR 82   Wt Readings from Last 3 Encounters:  12/15/20 144 lb 6.4 oz (65.5 kg)  11/13/20 144 lb 12.8 oz (65.7 kg)  04/09/20 143 lb (64.9 kg)   BP Readings from Last 3 Encounters:  12/15/20 (!) 146/68  11/13/20 (!) 154/62  04/09/20 120/70   Pulse Readings from Last 3 Encounters:  12/15/20 77  11/13/20 (!) 57  06/25/19 61    Current Outpatient Medications  Medication Sig Dispense Refill   ALPRAZolam (XANAX) 0.25 MG  tablet Take 0.125-0.25 mg by mouth at bedtime as needed for sleep.      amLODipine (NORVASC) 10 MG tablet Take 1 tablet (10 mg total) by mouth daily. 90 tablet 3   b complex vitamins capsule Take 1 capsule by mouth daily.     calcium carbonate (OSCAL) 1500 (600 Ca) MG TABS tablet Take by mouth 2 (two) times daily with a meal.     cholecalciferol (VITAMIN D3) 25 MCG (1000 UNIT) tablet Take 1,000 Units by mouth daily.     clobetasol cream (TEMOVATE) 5.62 % Apply 1 application topically 2 (two) times daily.     CO ENZYME Q-10 PO Take by mouth.     denosumab (PROLIA) 60 MG/ML SOSY injection Inject 60 mg into the skin every 6 (six) months.     escitalopram (LEXAPRO) 5 MG tablet Take 5 mg by mouth daily.     ezetimibe (ZETIA) 10 MG tablet TAKE ONE TABLET BY MOUTH ONE TIME DAILY 90 tablet 0   fluocinolone (VANOS) 0.01 % cream Apply topically 2 (two) times daily.     fluocinonide ointment (LIDEX) 1.30 % Apply 1 application topically 2 (two) times daily.     loratadine (CLARITIN) 10 MG tablet Take 10 mg by mouth at bedtime.     Melatonin 1 MG CAPS Take 1 mg by mouth at bedtime.      pravastatin (PRAVACHOL) 40 MG tablet  Take 1 tablet by mouth daily in the evening 90 tablet 0   timolol (BETIMOL) 0.25 % ophthalmic solution Place 1 drop into both eyes 2 (two) times daily.      valsartan (DIOVAN) 320 MG tablet Take 1 tablet (320 mg total) by mouth daily. 90 tablet 1   vitamin C (ASCORBIC ACID) 250 MG tablet Take 250 mg by mouth daily.     Zoster Vaccine Adjuvanted [facility]) injection      ketoconazole (NIZORAL) 2 % shampoo Apply 1 application topically as needed.     No current facility-administered medications for this visit.    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   CAD in native artery 06/25/2019   Cataract    unsure of which eye   Colon polyps    Environmental allergies    Essential hypertension 06/25/2019   Genetic testing 04/28/2017   Multi-Cancer panel (83 genes) @ [facility] - No pathogenic mutations detected   GERD (gastroesophageal reflux disease)    Glaucoma    High cholesterol    Hypertension    IBS (irritable bowel syndrome)    Osteoporosis 06/2016   T score -2.8   PONV (postoperative nausea and vomiting)    Pure hypercholesterolemia 06/25/2019   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 rate 14, height 5\' 2"  (1.575 m), weight 144 lb 6.4 oz (65.5 kg), SpO2 95 %.  Essential hypertension Patient with essential hypertension, still not at BP goal.  Will increase amlodipine to 10 mg daily, and have her continue with weekly BP checks at [facility].  Return in 2 months for follow up, but patient aware to call before then should she have any concerns or problems with medications.     Tommy Medal PharmD CPP [facility] [address] [place], [place] [address] [phone]

## 2020-12-15 NOTE — Patient Instructions (Signed)
Return for a a follow up appointment November 28 at 2 pm   Check your blood pressure at home weekly and keep record of the readings.  Take your BP meds as follows:  Increase amlodipine to 10 mg daily in the mornings.  You can take 2 of the 5 mg tablets each morning until they're gone, then start the 10 mg tablets.   Continue with all other medications  If you have any problems or concerns with your medications, please reach out to me at [phone] (Kamya Watling/Chris)  Bring all of your meds, your BP cuff and your record of home blood pressures to your next appointment.  Exercise as you're able, try to walk approximately 30 minutes per day.  Keep salt intake to a minimum, especially watch canned and prepared boxed foods.  Eat more fresh fruits and vegetables and fewer canned items.  Avoid eating in fast food restaurants.    HOW TO TAKE YOUR BLOOD PRESSURE: Rest 5 minutes before taking your blood pressure.  Don't smoke or drink caffeinated beverages for at least 30 minutes before. Take your blood pressure before (not after) you eat. Sit comfortably with your back supported and both feet on the floor (don't cross your legs). Elevate your arm to heart level on a table or a desk. Use the proper sized cuff. It should fit smoothly and snugly around your bare upper arm. There should be enough room to slip a fingertip under the cuff. The bottom edge of the cuff should be 1 inch above the crease of the elbow. Ideally, take 3 measurements at one sitting and record the average.

## 2020-12-29 ENCOUNTER — Other Ambulatory Visit: Payer: Self-pay | Admitting: Cardiovascular Disease

## 2020-12-29 NOTE — Telephone Encounter (Signed)
Rx(s) sent to pharmacy electronically.  

## 2021-01-19 ENCOUNTER — Other Ambulatory Visit: Payer: Self-pay | Admitting: Cardiovascular Disease

## 2021-01-19 NOTE — Telephone Encounter (Signed)
Rx(s) sent to pharmacy electronically.  

## 2021-02-05 ENCOUNTER — Other Ambulatory Visit: Payer: Self-pay | Admitting: Cardiovascular Disease

## 2021-02-15 ENCOUNTER — Ambulatory Visit (INDEPENDENT_AMBULATORY_CARE_PROVIDER_SITE_OTHER): Payer: Medicare Other | Admitting: Pharmacist Clinician (PhC)/ Clinical Pharmacy Specialist

## 2021-02-15 ENCOUNTER — Other Ambulatory Visit: Payer: Self-pay

## 2021-02-15 ENCOUNTER — Encounter: Payer: Self-pay | Admitting: Pharmacist Clinician (PhC)/ Clinical Pharmacy Specialist

## 2021-02-15 VITALS — BP 132/58 | HR 75 | Resp 18 | Ht 62.0 in | Wt 150.4 lb

## 2021-02-15 DIAGNOSIS — I10 Essential (primary) hypertension: Secondary | ICD-10-CM

## 2021-02-15 MED ORDER — HYDROCHLOROTHIAZIDE 12.5 MG PO CAPS: 12.5000 mg | ORAL_CAPSULE | Freq: Every day | ORAL | 6 refills | Status: DC

## 2021-02-15 NOTE — Patient Instructions (Signed)
Return for a a follow up appointment January 10 at 2 pm  Go to the lab in 2 weeks to check kidney function (week of Dec 12)  Check your blood pressure at home weekly and keep record of the readings.  Take your BP meds as follows:  Stop amlodipine for 3 days - Restart Friday with just 5 gm (1/2 of 10 mg tablet).   Start hydrochlorothiazide 12.5 mg once daily in the mornings.    Continue with all other medications  Bring all of your meds, your BP cuff and your record of home blood pressures to your next appointment.  Exercise as you're able, try to walk approximately 30 minutes per day.  Keep salt intake to a minimum, especially watch canned and prepared boxed foods.  Eat more fresh fruits and vegetables and fewer canned items.  Avoid eating in fast food restaurants.    HOW TO TAKE YOUR BLOOD PRESSURE: Rest 5 minutes before taking your blood pressure.  Don't smoke or drink caffeinated beverages for at least 30 minutes before. Take your blood pressure before (not after) you eat. Sit comfortably with your back supported and both feet on the floor (don't cross your legs). Elevate your arm to heart level on a table or a desk. Use the proper sized cuff. It should fit smoothly and snugly around your bare upper arm. There should be enough room to slip a fingertip under the cuff. The bottom edge of the cuff should be 1 inch above the crease of the elbow. Ideally, take 3 measurements at one sitting and record the average.

## 2021-02-15 NOTE — Progress Notes (Signed)
02/16/2021 DEAUNDRA DUPRIEST 1940-11-17 [id number]   HPI:  Sandra Wheeler is a 80 y.o. female patient of Dr Oval Linsey, with a [place] below who presents today for hypertension clinic evaluation.  At her last visit with Dr. Oval Linsey her pressure was noted to be 154/62 and amlodipine 5 mg daily was added to her medications.  She noted at that time that her stress levels had dropped.  At that visit her pressure had improved some, to 146/68, and her amlodipine was increased to 10 mg daily.  She did not have a home BP cuff, but was having her BP checked weekly with the [facility] RN on staff.    Today she returns for follow up.  States that her life has settled down to a very manageable level over the past month or two.  They have settled in at [facility] and are in a good routine of helping with their grandchildren who live nearby.  Other than some swelling in her feet, she feels well, and she was considering reaching out to her orthopedic MD, as she thought the swelling might be due to a past foot injury.    Past Medical History: ASCVD Non obstructive, calcium score 78, with calcified plaque in RCA and distal left main  hyperlipidemia LDL < 70 d/t hx of CAD and prior stroke; 1/22 LDL 76 on pravastatin 40      Blood Pressure Goal:  130/80  Current Medications:  valsartan 320 mg qd, amlodipine 5 mg qd  Family Hx: father died at 2 - DM, CHF, brother died at 14, pancreatic cancer, mom lived to be 31, has one daughter with hypertension  Social Hx: no tobacco, wine or mixed drink most days before dinner; no caffeine  Diet: lives at [facility], eats in dining room usually once daily; good mix of fruits and vegetables (usually fresh and roasted); some chips, (usually vegetable chips), usually low salt; no added salt regularly  Exercise: 2-3 days per week goes to exercise class, chair yoga, meditation/relaxation; also spends time most days with 3 teenage grandchildren, running to  appointments/practices  Home BP readings:    14 readings average 139/74 Previous visit - 8 readings average 142/75    Intolerances: no cardiac medication intolerances  Labs: 7/22:  Na 144,K 4.2, Glu 97, BUN 18, SCr 0.74 GFR 82   Wt Readings from Last 3 Encounters:  02/15/21 150 lb 6.4 oz (68.2 kg)  12/15/20 144 lb 6.4 oz (65.5 kg)  11/13/20 144 lb 12.8 oz (65.7 kg)   BP Readings from Last 3 Encounters:  02/15/21 (!) 132/58  12/15/20 (!) 146/68  11/13/20 (!) 154/62   Pulse Readings from Last 3 Encounters:  02/15/21 75  12/15/20 77  11/13/20 (!) 57    Current Outpatient Medications  Medication Sig Dispense Refill   ALPRAZolam (XANAX) 0.25 MG tablet Take 0.125-0.25 mg by mouth at bedtime as needed for sleep.      amLODipine (NORVASC) 10 MG tablet Take 1 tablet (10 mg total) by mouth daily. 90 tablet 3   b complex vitamins capsule Take 1 capsule by mouth daily.     calcium carbonate (OSCAL) 1500 (600 Ca) MG TABS tablet Take by mouth 2 (two) times daily with a meal.     cholecalciferol (VITAMIN D3) 25 MCG (1000 UNIT) tablet Take 1,000 Units by mouth daily.     clobetasol cream (TEMOVATE) 6.28 % Apply 1 application topically 2 (two) times daily.     CO  ENZYME Q-10 PO Take by mouth.     denosumab (PROLIA) 60 MG/ML SOSY injection Inject 60 mg into the skin every 6 (six) months.     escitalopram (LEXAPRO) 5 MG tablet Take 5 mg by mouth daily.     ezetimibe (ZETIA) 10 MG tablet TAKE ONE TABLET BY MOUTH ONE TIME DAILY 90 tablet 2   fluocinolone (VANOS) 0.01 % cream Apply topically 2 (two) times daily.     fluocinonide ointment (LIDEX) 4.92 % Apply 1 application topically 2 (two) times daily.     hydrochlorothiazide (MICROZIDE) 12.5 MG capsule Take 1 capsule (12.5 mg total) by mouth daily. 30 capsule 6   ketoconazole (NIZORAL) 2 % shampoo Apply 1 application topically as needed.     loratadine (CLARITIN) 10 MG tablet Take 10 mg by mouth at bedtime.     Melatonin 1 MG CAPS Take 1 mg by  mouth at bedtime.      pravastatin (PRAVACHOL) 40 MG tablet TAKE ONE TABLET BY MOUTH DAILY IN THE EVENING 90 tablet 2   timolol (BETIMOL) 0.25 % ophthalmic solution Place 1 drop into both eyes 2 (two) times daily.      valsartan (DIOVAN) 320 MG tablet TAKE ONE TABLET BY MOUTH ONE TIME DAILY 90 tablet 3   vitamin C (ASCORBIC ACID) 250 MG tablet Take 250 mg by mouth daily.     Zoster Vaccine Adjuvanted [facility]) injection      No current facility-administered medications for this visit.    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/25/2019   Cataract    unsure of which eye   Colon polyps    Environmental allergies    Essential hypertension 06/25/2019   Genetic testing 04/28/2017   Multi-Cancer panel (83 genes) @ [facility] - No pathogenic mutations detected   GERD (gastroesophageal reflux disease)    Glaucoma    High cholesterol    Hypertension    IBS (irritable bowel syndrome)    Osteoporosis 06/2016   T score -2.8   PONV (postoperative nausea and vomiting)    Pure hypercholesterolemia 06/25/2019   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 rate 18, height 5\' 2"  (1.575 m), weight 150 lb 6.4 oz (68.2 kg), SpO2 97 %.  Essential hypertension Patient with essential hypertension, doing much better on current regimen.  Unfortunately the increased dose of amlodipine has caused her to have some ankle and foot edema, which is uncomfortable.  Will have her hold amlodipine for 3 days, then resume with 5 mg daily (she can cut tablets in half until they are gone).  She will also start hydrochlorothiazide 12.5 mg once daily in the  mornings.  She was asked to continue with weekly BP checks by the [facility] RN and we will see her back in 6 weeks for follow up.     Tommy Medal PharmD CPP [facility] [address] [place], [place] [address] [phone]

## 2021-02-16 ENCOUNTER — Encounter: Payer: Self-pay | Admitting: Pharmacist Clinician (PhC)/ Clinical Pharmacy Specialist

## 2021-02-16 NOTE — Assessment & Plan Note (Signed)
Patient with essential hypertension, doing much better on current regimen.  Unfortunately the increased dose of amlodipine has caused her to have some ankle and foot edema, which is uncomfortable.  Will have her hold amlodipine for 3 days, then resume with 5 mg daily (she can cut tablets in half until they are gone).  She will also start hydrochlorothiazide 12.5 mg once daily in the mornings.  She was asked to continue with weekly BP checks by the [facility] RN and we will see her back in 6 weeks for follow up.

## 2021-03-01 ENCOUNTER — Telehealth: Payer: Self-pay | Admitting: *Deleted

## 2021-03-01 NOTE — Telephone Encounter (Signed)
Patient left message.  Scheduled for AEX on 04/14/21 with Dr. Dellis Filbert.  Patient asking if she can receive her Polia at that visit.  Per review of Epic, last Prolia received  11/26/20.   Next Prolia due on or after 05/25/21.   Routing to Karmen Bongo, Therapist, sports for f/u.

## 2021-03-02 NOTE — Telephone Encounter (Signed)
Message left to return call to Ski Polich at [phone].   

## 2021-03-04 NOTE — Telephone Encounter (Signed)
Patient advised of message as seen below and verbalized understanding.   Encounter closed.

## 2021-03-17 LAB — BASIC METABOLIC PANEL
BUN/Creatinine Ratio: 21 (ref 12–28)
BUN: 18 mg/dL (ref 8–27)
CO2: 26 mmol/L (ref 20–29)
Calcium: 9 mg/dL (ref 8.7–10.3)
Chloride: 104 mmol/L (ref 96–106)
Creatinine, Ser: 0.84 mg/dL (ref 0.57–1.00)
Glucose: 131 mg/dL — ABNORMAL HIGH (ref 70–99)
Potassium: 4.2 mmol/L (ref 3.5–5.2)
Sodium: 143 mmol/L (ref 134–144)
eGFR: 70 mL/min/{1.73_m2} (ref >59)

## 2021-03-30 ENCOUNTER — Ambulatory Visit (INDEPENDENT_AMBULATORY_CARE_PROVIDER_SITE_OTHER): Payer: Medicare Other | Admitting: Pharmacist Clinician (PhC)/ Clinical Pharmacy Specialist

## 2021-03-30 ENCOUNTER — Other Ambulatory Visit: Payer: Self-pay

## 2021-03-30 DIAGNOSIS — I10 Essential (primary) hypertension: Secondary | ICD-10-CM

## 2021-03-30 NOTE — Assessment & Plan Note (Signed)
Patient with essential hypertension, now at blood pressure goal on ARB/diuretic/CCB combination.  Did have some LEE with 10 mg dose of amlodipine, but much better since cutting back to 5 mg and adding hctz.  Will have patient continue with current medications and routine monitoring with [facility] RN.  She knows to contact office should her BP increase, otherwise she will keep annual appointment with Dr. Oval Linsey in the summer

## 2021-03-30 NOTE — Progress Notes (Signed)
03/30/2021 Sandra Wheeler 22-Oct-1940 [id number]   HPI:  Sandra Wheeler is a 81 y.o. female patient of Dr Oval Linsey, with a [place] below who presents today for hypertension clinic evaluation.  At her last visit with Dr. Oval Linsey her pressure was noted to be 154/62 and amlodipine 5 mg daily was added to her medications.  She noted at that time that her stress levels had dropped.  At that visit her pressure had improved some, to 146/68, and her amlodipine was increased to 10 mg daily.  She did not have a home BP cuff, but was having her BP checked weekly with the [facility] RN on staff.  At a previous visit she noted some swelling in her feet and thought it may have been due to a previous orthopedic injury.  We had her hold amlodipine 10 mg x 3 days then restart at 5 mg daily.  Also added hctz 12.5 mg.    Today she returns for follow up.  She has not had any problems with the decrease in amlodipine or addition of hctz.  Notes home BP readings (by [facility] RN) are usually 947-096 systolic. She feels more settled, and notes that she is planning to ask her PCP about stopping escitalopram, as she feels it is not necessary any longer.    Past Medical History: ASCVD Non obstructive, calcium score 78, with calcified plaque in RCA and distal left main  hyperlipidemia LDL < 70 d/t hx of CAD and prior stroke; 1/22 LDL 76 on pravastatin 40      Blood Pressure Goal:  130/80  Current Medications:  valsartan 320 mg qd, amlodipine 5 mg qd, hctz 12.5 mg qd  Family Hx: father died at 64 - DM, CHF, brother died at 4, pancreatic cancer, mom lived to be 35, has one daughter with hypertension  Social Hx: no tobacco, wine or mixed drink most days before dinner; no caffeine  Diet: lives at [facility], eats in dining room usually once daily; good mix of fruits and vegetables (usually fresh and roasted); some chips, (usually vegetable chips), usually low salt; no added salt regularly  Exercise: 2-3 days  per week goes to exercise class, chair yoga, meditation/relaxation; also spends time most days with 3 teenage grandchildren, running to appointments/practices  Home BP readings:    Only 3 readings with her from past 2 months,   Intolerances: no cardiac medication intolerances  Labs: 7/22:  Na 144,K 4.2, Glu 97, BUN 18, SCr 0.74 GFR 82   Wt Readings from Last 3 Encounters:  03/30/21 148 lb 9.6 oz (67.4 kg)  02/15/21 150 lb 6.4 oz (68.2 kg)  12/15/20 144 lb 6.4 oz (65.5 kg)   BP Readings from Last 3 Encounters:  03/30/21 110/70  02/15/21 (!) 132/58  12/15/20 (!) 146/68   Pulse Readings from Last 3 Encounters:  03/30/21 76  02/15/21 75  12/15/20 77    Current Outpatient Medications  Medication Sig Dispense Refill   ALPRAZolam (XANAX) 0.25 MG tablet Take 0.125-0.25 mg by mouth at bedtime as needed for sleep.      amLODipine (NORVASC) 10 MG tablet Take 1 tablet (10 mg total) by mouth daily. 90 tablet 3   b complex vitamins capsule Take 1 capsule by mouth daily.     calcium carbonate (OSCAL) 1500 (600 Ca) MG TABS tablet Take by mouth 2 (two) times daily with a meal.     cholecalciferol (VITAMIN D3) 25 MCG (1000 UNIT) tablet Take 1,000  Units by mouth daily.     clobetasol cream (TEMOVATE) 5.17 % Apply 1 application topically 2 (two) times daily.     CO ENZYME Q-10 PO Take by mouth.     denosumab (PROLIA) 60 MG/ML SOSY injection Inject 60 mg into the skin every 6 (six) months.     escitalopram (LEXAPRO) 5 MG tablet Take 5 mg by mouth daily.     ezetimibe (ZETIA) 10 MG tablet TAKE ONE TABLET BY MOUTH ONE TIME DAILY 90 tablet 2   fluocinolone (VANOS) 0.01 % cream Apply topically 2 (two) times daily.     fluocinonide ointment (LIDEX) 6.16 % Apply 1 application topically 2 (two) times daily.     hydrochlorothiazide (MICROZIDE) 12.5 MG capsule Take 1 capsule (12.5 mg total) by mouth daily. 30 capsule 6   ketoconazole (NIZORAL) 2 % shampoo Apply 1 application topically as needed.      loratadine (CLARITIN) 10 MG tablet Take 10 mg by mouth at bedtime.     Melatonin 1 MG CAPS Take 1 mg by mouth at bedtime.      pravastatin (PRAVACHOL) 40 MG tablet TAKE ONE TABLET BY MOUTH DAILY IN THE EVENING 90 tablet 2   timolol (BETIMOL) 0.25 % ophthalmic solution Place 1 drop into both eyes 2 (two) times daily.      valsartan (DIOVAN) 320 MG tablet TAKE ONE TABLET BY MOUTH ONE TIME DAILY 90 tablet 3   vitamin C (ASCORBIC ACID) 250 MG tablet Take 250 mg by mouth daily.     Zoster Vaccine Adjuvanted [facility]) injection      No current facility-administered medications for this visit.    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   CAD in native artery 06/25/2019   Cataract    unsure of which eye   Colon polyps    Environmental allergies    Essential hypertension 06/25/2019   Genetic testing 04/28/2017   Multi-Cancer panel (83 genes) @ [facility] - No pathogenic mutations detected   GERD (gastroesophageal reflux disease)    Glaucoma    High cholesterol    Hypertension    IBS (irritable bowel syndrome)    Osteoporosis 06/2016   T score -2.8   PONV (postoperative nausea and vomiting)    Pure hypercholesterolemia 06/25/2019   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 rate 14, height 5\' 2"  (1.575 m), weight 148 lb 9.6 oz (67.4 kg), SpO2 95 %.  Essential hypertension Patient with essential hypertension, now at blood pressure goal on ARB/diuretic/CCB combination.  Did have some LEE with 10 mg dose of amlodipine, but much better since  cutting back to 5 mg and adding hctz.  Will have patient continue with current medications and routine monitoring with [facility] RN.  She knows to contact office should her BP increase, otherwise she will keep annual appointment with Dr. Oval Linsey in the [place] PharmD CPP [address]  [place], [place] [address] [phone]

## 2021-03-30 NOTE — Patient Instructions (Signed)
Thank you for choosing [facility] your blood pressure at home daily and keep record of the readings.  Take your BP meds as follows:  No changes to medications  Bring all of your meds, your BP cuff and your record of home blood pressures to your next appointment.  Exercise as youre able, try to walk approximately 30 minutes per day.  Keep salt intake to a minimum, especially watch canned and prepared boxed foods.  Eat more fresh fruits and vegetables and fewer canned items.  Avoid eating in fast food restaurants.    HOW TO TAKE YOUR BLOOD PRESSURE: Rest 5 minutes before taking your blood pressure.  Dont smoke or drink caffeinated beverages for at least 30 minutes before. Take your blood pressure before (not after) you eat. Sit comfortably with your back supported and both feet on the floor (dont cross your legs). Elevate your arm to heart level on a table or a desk. Use the proper sized cuff. It should fit smoothly and snugly around your bare upper arm. There should be enough room to slip a fingertip under the cuff. The bottom edge of the cuff should be 1 inch above the crease of the elbow. Ideally, take 3 measurements at one sitting and record the average.

## 2021-04-14 ENCOUNTER — Encounter: Payer: Self-pay | Admitting: Obstetrics & Gynecology

## 2021-04-14 ENCOUNTER — Other Ambulatory Visit: Payer: Self-pay

## 2021-04-14 ENCOUNTER — Ambulatory Visit (INDEPENDENT_AMBULATORY_CARE_PROVIDER_SITE_OTHER): Payer: Medicare Other | Admitting: Obstetrics & Gynecology

## 2021-04-14 VITALS — BP 112/64 | HR 74 | Resp 16 | Ht 61.75 in | Wt 144.0 lb

## 2021-04-14 DIAGNOSIS — Z01419 Encounter for gynecological examination (general) (routine) without abnormal findings: Secondary | ICD-10-CM | POA: Diagnosis not present

## 2021-04-14 DIAGNOSIS — Z9189 Other specified personal risk factors, not elsewhere classified: Secondary | ICD-10-CM | POA: Diagnosis not present

## 2021-04-14 DIAGNOSIS — Z780 Asymptomatic menopausal state: Secondary | ICD-10-CM | POA: Diagnosis not present

## 2021-04-14 DIAGNOSIS — M810 Age-related osteoporosis without current pathological fracture: Secondary | ICD-10-CM

## 2021-04-14 DIAGNOSIS — Z90710 Acquired absence of both cervix and uterus: Secondary | ICD-10-CM

## 2021-04-14 MED ORDER — ALENDRONATE SODIUM 70 MG PO TABS: 70.0000 mg | ORAL_TABLET | ORAL | 4 refills | Status: DC

## 2021-04-14 NOTE — Progress Notes (Addendum)
Sandra Wheeler 10/18/1940 [id number]   History:    81 y.o.   G1P1L1 Married.  Now lives at [facility].   RP:  Established patient presenting for annual gyn exam    HPI: History of total hysterectomy.  Postmenopausal, well on no hormone replacement therapy.  No pelvic pain.  Currently abstinent.  Urine normal, no SUI and bowel movements normal. Breasts normal. Screening mammo Neg 09/2020.  Body mass index 26.55.  Osteoporosis on Prolia, would like to switch back to Fosamax.  Bone Density in 08/2018 improved with a T-Score of -2.6, will repeat BD here now.   Health labs with family physician. Colonoscopy 2012, no further Colono per Dr Collene Mares.     Past medical history,surgical history, family history and social history were all reviewed and documented in the EPIC chart.  Gynecologic History No LMP recorded. Patient has had a hysterectomy.  Obstetric History OB History  Gravida Para Term Preterm AB Living  1 1       1   SAB IAB Ectopic Multiple Live Births               # Outcome Date GA Lbr Len/2nd Weight Sex Delivery Anes PTL Lv  1 Para              ROS: A ROS was performed and pertinent positives and negatives are included in the history.  GENERAL: No fevers or chills. HEENT: No change in vision, no earache, sore throat or sinus congestion. NECK: No pain or stiffness. CARDIOVASCULAR: No chest pain or pressure. No palpitations. PULMONARY: No shortness of breath, cough or wheeze. GASTROINTESTINAL: No abdominal pain, nausea, vomiting or diarrhea, melena or bright red blood per rectum. GENITOURINARY: No urinary frequency, urgency, hesitancy or dysuria. MUSCULOSKELETAL: No joint or muscle pain, no back pain, no recent trauma. DERMATOLOGIC: No rash, no itching, no lesions. ENDOCRINE: No polyuria, polydipsia, no heat or cold intolerance. No recent change in weight. HEMATOLOGICAL: No anemia or easy bruising or bleeding. NEUROLOGIC: No headache, seizures, numbness, tingling or weakness.  PSYCHIATRIC: No depression, no loss of interest in normal activity or change in sleep pattern.     Exam:   BP 112/64    Pulse 74    Resp 16    Ht 5' 1.75" (1.568 m)    Wt 144 lb (65.3 kg)    BMI 26.55 kg/m   Body mass index is 26.55 kg/m.  General appearance : Well developed well nourished female. No acute distress HEENT: Eyes: no retinal hemorrhage or exudates,  Neck supple, trachea midline, no carotid bruits, no thyroidmegaly Lungs: Clear to auscultation, no rhonchi or wheezes, or rib retractions  Heart: Regular rate and rhythm, no murmurs or gallops Breast:Examined in sitting and supine position were symmetrical in appearance, no palpable masses or tenderness,  no skin retraction, no nipple inversion, no nipple discharge, no skin discoloration, no axillary or supraclavicular lymphadenopathy Abdomen: no palpable masses or tenderness, no rebound or guarding Extremities: no edema or skin discoloration or tenderness  Pelvic: Vulva: Normal             Vagina: No gross lesions or discharge  Cervix/Uterus absent  Adnexa  Without masses or tenderness  Anus: Normal   Assessment/Plan:  81 y.o. female for annual exam   1. Well female exam with routine gynecological exam History of total hysterectomy.  Postmenopausal, well on no hormone replacement therapy.  No pelvic pain.  Currently abstinent.  Urine normal, no SUI and bowel movements normal.  Breasts normal. Screening mammo Neg 09/2020.  Body mass index 26.55.  Osteoporosis on Prolia, would like to switch back to Fosamax.  Bone Density in 08/2018 improved with a T-Score of -2.6, will repeat BD here now.   Health labs with family physician. Colonoscopy 2012, no further Colono per Dr Collene Mares.    2. At risk of fracture due to osteoporosis  3. S/P total hysterectomy  4. Postmenopause History of total hysterectomy.  Postmenopausal, well on no hormone replacement therapy.  No pelvic pain.  Currently abstinent.    5. Age-related osteoporosis  without current pathological fracture Osteoporosis on Prolia, would like to switch back to Fosamax.  Bone Density in 08/2018 improved with a T-Score of -2.6, will repeat BD here now.  No CI to restarting on Fosamax after many years of holiday.  Usage reviewed.  Prescription sent to pharmacy.  Stop Prolia. - DG Bone Density; Future  Other orders - melatonin 5 MG TABS; 1 TABLET - amLODipine (NORVASC) 5 MG tablet; 1 tablet - Ascorbic Acid (VITAMIN C PO); Take 1,000 mg by mouth. - CALCIUM PO; Take 600 mg by mouth daily. - UNABLE TO FIND; 180 mg. Med Name: Aller tec - Vitamin D, Ergocalciferol, 50 MCG (2000 UT) CAPS; Take by mouth. - triamcinolone (KENALOG) 0.1 % paste; Use as directed 1 application in the mouth or throat as needed.  - alendronate (FOSAMAX) 70 MG tablet; Take 1 tablet (70 mg total) by mouth every 7 (seven) days. Take with a full glass of water on an empty stomach.    Princess Bruins MD, 1:48 PM 04/14/2021

## 2021-04-20 ENCOUNTER — Telehealth: Payer: Self-pay | Admitting: *Deleted

## 2021-04-20 NOTE — Telephone Encounter (Signed)
-----   Message from Princess Bruins, MD sent at 04/14/2021  2:09 PM EST ----- Regarding: Stop Prolia FYI patient decided to stop Prolia.  Restarted on Fosamax.

## 2021-04-20 NOTE — Telephone Encounter (Signed)
Patient removed from [place] folder.   Encounter closed.

## 2021-05-04 ENCOUNTER — Other Ambulatory Visit: Payer: Self-pay | Admitting: Obstetrics & Gynecology

## 2021-05-04 ENCOUNTER — Other Ambulatory Visit: Payer: Self-pay

## 2021-05-04 ENCOUNTER — Ambulatory Visit (INDEPENDENT_AMBULATORY_CARE_PROVIDER_SITE_OTHER): Payer: Medicare Other

## 2021-05-04 DIAGNOSIS — M810 Age-related osteoporosis without current pathological fracture: Secondary | ICD-10-CM

## 2021-05-04 DIAGNOSIS — Z780 Asymptomatic menopausal state: Secondary | ICD-10-CM

## 2021-07-01 ENCOUNTER — Encounter: Payer: Self-pay | Admitting: Obstetrics & Gynecology

## 2021-09-28 ENCOUNTER — Other Ambulatory Visit: Payer: Self-pay | Admitting: Cardiovascular Disease

## 2021-09-28 NOTE — Telephone Encounter (Signed)
Rx request sent to pharmacy.  

## 2021-10-02 ENCOUNTER — Other Ambulatory Visit: Payer: Self-pay | Admitting: Cardiovascular Disease

## 2021-10-04 NOTE — Telephone Encounter (Signed)
Rx(s) sent to pharmacy electronically.  

## 2021-10-12 ENCOUNTER — Other Ambulatory Visit: Payer: Self-pay | Admitting: Cardiovascular Disease

## 2021-10-19 ENCOUNTER — Other Ambulatory Visit: Payer: Self-pay | Admitting: Cardiovascular Disease

## 2021-11-16 NOTE — Progress Notes (Unsigned)
[facility]  PATIENT: Sandra Wheeler DOB: [birth year]  REFERRING CLINICIAN: Marda Stalker, PA-C HISTORY FROM: self, spouse Richard REASON FOR VISIT: memory loss   HISTORICAL  CHIEF COMPLAINT:  Chief Complaint  Patient presents with   Memory Loss    RM 8 with spouse richard Pt is well, has been having memory concerns for about a yr. Forget daily things like nails, how to work TV, not remembering correct words for things.     HISTORY OF PRESENT ILLNESS:  The patient presents for evaluation of memory loss over the past year. It has been getting worse over time. She has noticed issues primarily with short term memory and word finding difficulty. Forgets how to work things around the house, for example with not remember how to turn the television on. It also takes her longer to do things on the computer. Has trouble figuring out what time it is, or what time she needs to be places. Has gone to places an hour early twice this week. Forgets acquaintances and friends names, has not forgotten family members names. She remains socially active and is the head of a committee, which is somewhat stressful for her.   She is afraid of falling. 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 [place] off of a curb and sprained her ankle 2 years ago. Also fell about 3 years ago and hit her head. She does yoga 2-3 times per week.   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 Hit her head on the door and thinks she may have a had a concussion. Lost her sense of smell and it never returned. Did not seek evaluation. 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 She talks in her sleep and snores a little. Typically does not act out her dreams. Mood: History of anxiety, takes Lexapro but is not sure if it helps  Functional status:  Patient lives with her  husband Cooking: no issues, does not cook much Driving: drives a little bit, has not gotten lost but will sometimes forget where to turn next Bills: does not manage the finances, but does not think she would be able to Medications: no issues, she would like to simplify her medications Ever left the stove on by accident?: no Forget how to use items around the house?: yes, television Forgetting loved ones names?: no Word finding difficulty? yes  OTHER MEDICAL CONDITIONS: CAD, HTN, HLD, anxiety   REVIEW OF SYSTEMS: Full 14 system review of systems performed and negative with exception of: memory loss  ALLERGIES: Allergies  Allergen Reactions   Codeine Other (See Comments)    Knocked her out Other reaction(s): Unknown   Meloxicam Other (See Comments)    BLURRY VISION, DIZZY, CONFUSION  Other reaction(s): Unknown   Sulfamethoxazole-Trimethoprim     Other reaction(s): Unknown   Tramadol Other (See Comments)    CONFUSION     HOME MEDICATIONS: Outpatient Medications Prior to Visit  Medication Sig Dispense Refill   alendronate (FOSAMAX) 70 MG tablet Take 1 tablet (70 mg total) by mouth every 7 (seven) days. Take with a full glass of water on an empty stomach. 12 tablet 4   ALPRAZolam (XANAX) 0.25 MG tablet Take 0.25 mg by mouth at bedtime as needed for sleep.     amLODipine (NORVASC) 5 MG tablet  1 tablet     Ascorbic Acid (VITAMIN C PO) Take 1,000 mg by mouth.     b complex vitamins capsule Take 1 capsule by mouth daily.     CALCIUM PO Take 600 mg by mouth daily.     CO ENZYME Q-10 PO Take by mouth.     escitalopram (LEXAPRO) 5 MG tablet Take 5 mg by mouth daily.     fluocinolone (VANOS) 0.01 % cream Apply topically 2 (two) times daily.     hydrochlorothiazide (MICROZIDE) 12.5 MG capsule TAKE 1 CAPSULE BY MOUTH DAILY 90 capsule 0   pravastatin (PRAVACHOL) 40 MG tablet Take 1 tablet (40 mg total) by mouth every evening. 90 tablet 0   triamcinolone (KENALOG) 0.1 % paste Use as  directed 1 application in the mouth or throat as needed.     UNABLE TO FIND 180 mg. Med Name: Aller tec     valsartan (DIOVAN) 320 MG tablet TAKE ONE TABLET BY MOUTH ONE TIME DAILY 90 tablet 3   Vitamin D, Ergocalciferol, 50 MCG (2000 UT) CAPS Take by mouth.     clobetasol cream (TEMOVATE) 8.41 % Apply 1 application topically 2 (two) times daily. (Patient not taking: Reported on 11/17/2021)     ezetimibe (ZETIA) 10 MG tablet TAKE ONE TABLET BY MOUTH ONE TIME DAILY (Patient not taking: Reported on 11/17/2021) 90 tablet 0   fluocinonide ointment (LIDEX) 3.24 % Apply 1 application topically 2 (two) times daily. (Patient not taking: Reported on 11/17/2021)     ketoconazole (NIZORAL) 2 % shampoo Apply 1 application topically as needed. (Patient not taking: Reported on 11/17/2021)     melatonin 5 MG TABS 1 TABLET (Patient not taking: Reported on 11/17/2021)     timolol (BETIMOL) 0.25 % ophthalmic solution Place 1 drop into both eyes 2 (two) times daily.  (Patient not taking: Reported on 11/17/2021)     No facility-administered medications prior to visit.    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   CAD in native artery 06/25/2019   Cataract    unsure of which eye   Colon polyps    Environmental allergies    Essential hypertension 06/25/2019   Genetic testing 04/28/2017   Multi-Cancer panel (83 genes) @ [facility] - No pathogenic mutations detected   GERD (gastroesophageal reflux disease)    Glaucoma    High cholesterol    Hypertension    IBS (irritable bowel syndrome)    Memory change    Osteoporosis 06/2016   T score -2.8   PONV (postoperative nausea and vomiting)    Pure hypercholesterolemia 06/25/2019   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, 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    PAST SURGICAL HISTORY: Past Surgical History:  Procedure Laterality Date   ABDOMINAL HYSTERECTOMY     BLADDER SUSPENSION  N/A 04/08/2016   Procedure: TRANSVAGINAL TAPE (TVT) PROCEDURE;  Surgeon: Princess Bruins, MD;  Location: [facility];  Service: Gynecology;  Laterality: N/A;   BREAST SURGERY     cyst removal   CARDIAC CATHETERIZATION     CATARACT EXTRACTION, BILATERAL     COLONOSCOPY     CYSTOSCOPY N/A 04/08/2016   Procedure: CYSTOSCOPY;  Surgeon: Princess Bruins, MD;  Location: [facility];  Service: Gynecology;  Laterality: N/A;   DENTAL SURGERY  03/31/2016   ELBOW FRACTURE SURGERY     EYE SURGERY Left    blood  clot on retina   ROBOTIC ASSISTED TOTAL HYSTERECTOMY WITH BILATERAL SALPINGO OOPHERECTOMY Bilateral 04/08/2016   Procedure: ROBOTIC ASSISTED TOTAL HYSTERECTOMY WITH BILATERAL SALPINGO OOPHORECTOMY/Uterosacral Ligament Suspension;  Surgeon: Princess Bruins, MD;  Location: [facility];  Service: Gynecology;  Laterality: Bilateral;  Request 4 hrs.   TUBAL LIGATION     WRIST ARTHROSCOPY      FAMILY HISTORY: Family History  Problem Relation Age of Onset   Macular degeneration Mother    Hearing loss Mother    CAD Mother    Diabetes Father        dx in his 33s-60s   Heart disease Father    Rheumatic fever Father    Pancreatic cancer Brother 44       non-smoker; deceased 71   Cancer Maternal Grandmother        abdominal; unsure of primary; deceased 27   Heart disease Maternal Grandfather    Congestive Heart Failure Paternal Grandfather    Other Brother        at birth   Cancer Other        MGMs sister, abdominal; unsure of primary; deceased 51s   Pancreatic cancer Maternal Uncle 93       non-smoker; deceased 7   Alcohol abuse Maternal Uncle    Dementia Paternal Aunt    Congestive Heart Failure Paternal Uncle    Leukemia Cousin        female; daughter of mat uncle with pancreatic ca; deceased 60    SOCIAL HISTORY: Social History   Socioeconomic History   Marital status: Married    Spouse name: Not on file   Number of children: 1   Years of education: Not on file   Highest education  level: Not on file  Occupational History   Not on file  Tobacco Use   Smoking status: Former    Types: Cigarettes    Quit date: 03/21/1969    Years since quitting: 52.6   Smokeless tobacco: Never  Vaping Use   Vaping Use: Never used  Substance and Sexual Activity   Alcohol use: Yes    Comment: 7 a week   Drug use: No   Sexual activity: Not Currently    Birth control/protection: Surgical    Comment: First intercouse at age 81. Only one partner.  Other Topics Concern   Not on file  Social History Narrative   11/17/21 lives with husband   Coffee 1-2 daily   Social Determinants of Health   Financial Resource Strain: Not on file  Food Insecurity: Not on file  Transportation Needs: Not on file  Physical Activity: Not on file  Stress: Not on file  Social Connections: Not on file  Intimate Partner Violence: Not on file     PHYSICAL EXAM  GENERAL EXAM/CONSTITUTIONAL: Vitals:  Vitals:   11/17/21 0921  BP: 133/65  Pulse: 60  Weight: 144 lb (65.3 kg)  Height: '5\' 2"'$  (1.575 m)   Body mass index is 26.34 kg/m. Wt Readings from Last 3 Encounters:  11/17/21 144 lb (65.3 kg)  04/14/21 144 lb (65.3 kg)  03/30/21 148 lb 9.6 oz (67.4 kg)    NEUROLOGIC: MENTAL STATUS:     11/17/2021    9:37 AM  Montreal Cognitive Assessment   Visuospatial/ Executive (0/5) 1  Naming (0/3) 2  Attention: Read list of digits (0/2) 2  Attention: Read list of letters (0/1) 0  Attention: Serial 7 subtraction starting at 100 (0/3) 0  Language: Repeat phrase (0/2) 0  Language : Fluency (0/1) 0  Abstraction (0/2) 2  Delayed Recall (0/5) 2  Orientation (0/6) 5  Total 14     CRANIAL NERVE:  2nd, 3rd, 4th, 6th - pupils equal and reactive to light, visual fields full to confrontation, extraocular muscles intact, no nystagmus 5th - facial sensation symmetric 7th - facial strength symmetric 8th - hearing intact 9th - palate elevates symmetrically, uvula midline 11th - shoulder shrug  symmetric 12th - tongue protrusion midline  MOTOR:  normal bulk and tone, no cogwheeling, full strength in the BUE, BLE  SENSORY:  normal and symmetric to light touch all 4 extremities  COORDINATION:  finger-nose-finger, fine finger movements normal, mild action tremor on left  REFLEXES:  deep tendon reflexes present and symmetric  GAIT/STATION:  Slightly decreased stride length, mildly wide-based gait     DIAGNOSTIC DATA (LABS, IMAGING, TESTING) - I reviewed patient records, labs, notes, testing and imaging myself where available.  Lab Results  Component Value Date   WBC 5.9 05/25/2019   HGB 13.8 05/25/2019   HCT 42.0 05/25/2019   MCV 99.1 05/25/2019   PLT 282 05/25/2019      Component Value Date/Time   NA 143 03/17/2021 0819   K 4.2 03/17/2021 0819   CL 104 03/17/2021 0819   CO2 26 03/17/2021 0819   GLUCOSE 131 (H) 03/17/2021 0819   GLUCOSE 109 (H) 05/25/2019 1358   BUN 18 03/17/2021 0819   CREATININE 0.84 03/17/2021 0819   CALCIUM 9.0 03/17/2021 0819   PROT 7.0 03/31/2016 0935   ALBUMIN 4.0 03/31/2016 0935   AST 22 03/31/2016 0935   ALT 22 03/31/2016 0935   ALKPHOS 51 03/31/2016 0935   BILITOT 0.5 03/31/2016 0935   GFRNONAA 72 08/08/2019 0817   GFRAA 83 08/08/2019 0817   Lab Results  Component Value Date   CHOL 168 10/01/2019   HDL 51 10/01/2019   LDLCALC 95 10/01/2019   TRIG 126 10/01/2019   CHOLHDL 3.3 10/01/2019   09/19/21 TSH, B12 wnl  ASSESSMENT AND PLAN  81 y.o. year old female with a history of CAD, HTN, HLD, anxiety who presents for evaluation of memory loss over the past year. Her MOCA score today is 14/30, which is concerning for early dementia. She is able to perform most of her ADLs independently but notes they have become more difficult for her over time. Will order brain MRI to assess for signs of neurodegeneration and/or significant vascular disease. Will start donepezil for her memory.   1. Memory loss       PLAN: - MRI brain   - Start donepezil 5 mg daily x4 weeks, then increase to 10 mg daily - Follow up after testing is complete.   Orders Placed This Encounter  Procedures   MR BRAIN W WO CONTRAST    Meds ordered this encounter  Medications   DISCONTD: donepezil (ARICEPT) 5 MG tablet    Sig: Take 1 pill daily for 4 weeks, then increase to 2 pills daily    Dispense:  60 tablet    Refill:  6   donepezil (ARICEPT) 5 MG tablet    Sig: Take 1 pill daily for 4 weeks, then increase to 2 pills daily    Dispense:  60 tablet    Refill:  6    Return in about 3 months (around 02/17/2022).  I spent an average of 38 minutes chart reviewing and counseling the patient, with at least 50% of the time face to face with the  patient. General brain health measures discussed. Reviewed safety measures including driving safety.   Genia Harold, MD 11/17/21 10:15 AM  [facility] [address], [place], [place] [address] [phone]

## 2021-11-17 ENCOUNTER — Ambulatory Visit (INDEPENDENT_AMBULATORY_CARE_PROVIDER_SITE_OTHER): Payer: Medicare Other | Admitting: Psychiatry

## 2021-11-17 ENCOUNTER — Encounter: Payer: Self-pay | Admitting: *Deleted

## 2021-11-17 VITALS — BP 133/65 | HR 60 | Ht 62.0 in | Wt 144.0 lb

## 2021-11-17 DIAGNOSIS — R413 Other amnesia: Secondary | ICD-10-CM | POA: Diagnosis not present

## 2021-11-17 MED ORDER — DONEPEZIL HCL 5 MG PO TABS: ORAL_TABLET | ORAL | 6 refills | Status: DC

## 2021-11-17 NOTE — Patient Instructions (Addendum)
MRI of the brain Start donepezil for memory. Take 5 mg (1 pill) daily for 4 weeks, then increase to 10 mg (2 pills) daily  Tasks to improve attention/working memory 1. Good sleep hygiene (7-8 hrs of sleep) 2. Learning a new skill (Painting, Carpentry, Pottery, new language, Knitting). 3.Cognitive exercises (keep a daily journal, Puzzles) 4. Physical exercise and training  (30 min/day X 4 days week) 5. Being on Antidepressant if needed 6.Yoga, Meditation, Tai Chi 7. Decrease alcohol intake 8.Have a clear schedule and structure in daily routine  MIND Diet: The [employer] Diet Intervention for Neurodegenerative Delay, or MIND diet, targets the health of the aging brain. Research participants with the highest MIND diet scores had a significantly slower rate of cognitive decline compared with those with the lowest scores. The effects of the MIND diet on cognition showed greater effects than either the Mediterranean or the DASH diet alone.  The healthy items the MIND diet guidelines suggest include:  3+ servings a day of whole grains 1+ servings a day of vegetables (other than green leafy) 6+ servings a week of green leafy vegetables 5+ servings a week of nuts 4+ meals a week of beans 2+ servings a week of berries 2+ meals a week of poultry 1+ meals a week of fish Mainly olive oil if added fat is used  The unhealthy items, which are higher in saturated and trans fat, include: Less than 5 servings a week of pastries and sweets Less than 4 servings a week of red meat (including beef, pork, lamb, and products made from these meats) Less than one serving a week of cheese and fried foods Less than 1 tablespoon a day of butter/stick margarine

## 2021-11-26 ENCOUNTER — Telehealth: Payer: Self-pay | Admitting: Psychiatry

## 2021-11-26 NOTE — Telephone Encounter (Signed)
[employer] medicare [employer] via website

## 2021-12-07 ENCOUNTER — Ambulatory Visit
Admission: RE | Admit: 2021-12-07 | Discharge: 2021-12-07 | Disposition: A | Discharge status: Home / Self Care | Payer: Medicare Other | Source: Ambulatory Visit | Attending: Psychiatry | Admitting: Psychiatry

## 2021-12-07 DIAGNOSIS — R413 Other amnesia: Secondary | ICD-10-CM

## 2021-12-07 MED ORDER — GADOBENATE DIMEGLUMINE 529 MG/ML IV SOLN
14.0000 mL | Freq: Once | INTRAVENOUS | Status: AC | PRN
  Administered 2021-12-07: 14 mL via INTRAVENOUS

## 2021-12-09 ENCOUNTER — Other Ambulatory Visit: Payer: Self-pay | Admitting: Psychiatry

## 2021-12-29 ENCOUNTER — Other Ambulatory Visit: Payer: Self-pay | Admitting: Cardiovascular Disease

## 2021-12-30 NOTE — Telephone Encounter (Signed)
Rx(s) sent to pharmacy electronically.  

## 2022-01-13 ENCOUNTER — Other Ambulatory Visit: Payer: Self-pay | Admitting: Cardiovascular Disease

## 2022-01-19 ENCOUNTER — Other Ambulatory Visit: Payer: Self-pay | Admitting: Cardiovascular Disease

## 2022-01-24 ENCOUNTER — Telehealth (HOSPITAL_BASED_OUTPATIENT_CLINIC_OR_DEPARTMENT_OTHER): Payer: Self-pay | Admitting: *Deleted

## 2022-01-24 NOTE — Telephone Encounter (Signed)
Patient called and left message on billing line needed refill  Left message to call back, unsure what she is needing filled

## 2022-01-27 MED ORDER — AMLODIPINE BESYLATE 5 MG PO TABS: 5.0000 mg | ORAL_TABLET | Freq: Every day | ORAL | 0 refills | Status: DC

## 2022-01-27 MED ORDER — EZETIMIBE 10 MG PO TABS
10.0000 mg | ORAL_TABLET | Freq: Every day | ORAL | 0 refills | Status: DC
Start: 2022-01-27 — End: 2022-04-12

## 2022-01-27 MED ORDER — HYDROCHLOROTHIAZIDE 12.5 MG PO CAPS: 12.5000 mg | ORAL_CAPSULE | Freq: Every day | ORAL | 0 refills | Status: DC

## 2022-01-27 NOTE — Telephone Encounter (Signed)
Spoke with patient and she requested refill on Amlodipine 5 mg, Zetia 10 mg, and HCTZ 12.5 mg all taking daily  Confirmed doses with patient  Sent refills as requested and scheduled patient appointment for next week with Overton Mam NP

## 2022-02-02 ENCOUNTER — Telehealth (HOSPITAL_BASED_OUTPATIENT_CLINIC_OR_DEPARTMENT_OTHER): Payer: Self-pay | Admitting: Family

## 2022-02-02 NOTE — Telephone Encounter (Signed)
Left message for patient to call and reschedule the 02/04/22 8:25 am appt with Laurann Montana, NP--provider not in the office

## 2022-02-04 ENCOUNTER — Ambulatory Visit (HOSPITAL_BASED_OUTPATIENT_CLINIC_OR_DEPARTMENT_OTHER): Payer: Medicare Other | Admitting: Family

## 2022-02-23 ENCOUNTER — Ambulatory Visit (INDEPENDENT_AMBULATORY_CARE_PROVIDER_SITE_OTHER): Payer: Medicare Other | Admitting: Neurology

## 2022-02-23 ENCOUNTER — Encounter: Payer: Self-pay | Admitting: Neurology

## 2022-02-23 VITALS — BP 139/64 | HR 58 | Ht 62.0 in | Wt 143.5 lb

## 2022-02-23 DIAGNOSIS — R413 Other amnesia: Secondary | ICD-10-CM | POA: Diagnosis not present

## 2022-02-23 MED ORDER — MEMANTINE HCL 10 MG PO TABS: ORAL_TABLET | ORAL | 3 refills | Status: DC

## 2022-02-23 MED ORDER — DONEPEZIL HCL 10 MG PO TABS: 10.0000 mg | ORAL_TABLET | Freq: Every day | ORAL | 3 refills | Status: DC

## 2022-02-23 NOTE — Patient Instructions (Addendum)
I will start namenda for your memory to take along with Aricept. For the Namenda, start taking 5 mg twice daily for 2 weeks, then take 10 mg twice daily. I will refer you to neuropsychological evaluation. See you back in 6 months   Meds ordered this encounter  Medications   memantine (NAMENDA) 10 MG tablet    Sig: Take 1/2 tablet twice daily x 2 weeks, then take 1 tablet twice daily    Dispense:  180 tablet    Refill:  3   donepezil (ARICEPT) 10 MG tablet    Sig: Take 1 tablet (10 mg total) by mouth daily.    Dispense:  90 tablet    Refill:  3   Orders Placed This Encounter  Procedures   Ambulatory referral to Neuropsychology   n

## 2022-02-23 NOTE — Progress Notes (Signed)
Patient: Sandra Wheeler Date of Birth: [birth year]  Reason for Visit: Follow up History from: Patient, husband Primary Neurologist: [place]  ASSESSMENT AND PLAN 81 y.o. 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 -MoCA 18/30 today -Aricept 10 mg daily, add on Namenda working up to 10 mg twice a day -Recommend against driving given MOCA score, specifically difficulty with visual-spatial function  -Referral for formal neuropsychological testing, to help with categorizing memory deficits with underlying anxiety , help her and her husband plan for the future -MRI of the brain showed mild generalized cortical atrophy, a little more than normal for age -Encouraged exercise, healthy eating, brain stimulating activities, good management of vascular risk factors -Follow-up in 6 months or sooner if needed  HISTORY OF PRESENT ILLNESS: Today 02/23/22 Sandra Wheeler is here today for follow-up.  MRI of the brain September 2023 showed mild generalized cortical atrophy, a little more than normal for age.  She was started on Aricept 10 mg. MOCA was 14/30. Today, she feels things are significantly better since starting Aricept, has helped a lot with word findings, [place] 18/30. She is taking Aricept 10 mg in AM. Doesn't think any side effects. She drives without any issues, not getting lost. They lives at [facility] independent living. She does mention stress, not for any particular reason, is on Lexapro, takes Xanax at night if needed. 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   HISTORY  11/17/21 Dr. Billey Gosling: The patient presents for evaluation of memory loss over the past year. It has been getting worse over time. She has noticed issues primarily with short term memory and word finding difficulty. Forgets how to work things around the house, for example with not remember how to turn the television on. It also takes her longer to do  things on the computer. Has trouble figuring out what time it is, or what time she needs to be places. Has gone to places an hour early twice this week. Forgets acquaintances and friends names, has not forgotten family members names. She remains socially active and is the head of a committee, which is somewhat stressful for her.    She is afraid of falling. 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 [place] off of a curb and sprained her ankle 2 years ago. Also fell about 3 years ago and hit her head. She does yoga 2-3 times per week.    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 Hit her head on the door and thinks she may have a had a concussion. Lost her sense of smell and it never returned. Did not seek evaluation. 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 She talks in her sleep and snores a little. Typically does not act out her dreams. Mood: History of anxiety, takes Lexapro but is not sure if it helps   Functional status:  Patient lives with her husband Cooking: no issues, does not cook much Driving: drives a little bit, has not gotten lost but will sometimes forget where to turn next Bills: does not manage the finances, but does not think she would be able to Medications: no issues, she would like to simplify her medications Ever left the stove on by accident?: no Forget how  to use items around the house?: yes, television Forgetting loved ones names?: no Word finding difficulty? yes   OTHER MEDICAL CONDITIONS: CAD, HTN, HLD, anxiety  REVIEW OF SYSTEMS: Out of a complete 14 system review of symptoms, the patient complains only of the following symptoms, and all other reviewed systems are negative.  See HPI  ALLERGIES: Allergies  Allergen Reactions   Codeine Other (See Comments)    Knocked her out Other reaction(s): Unknown   Meloxicam Other  (See Comments)    BLURRY VISION, DIZZY, CONFUSION  Other reaction(s): Unknown   Sulfamethoxazole-Trimethoprim     Other reaction(s): Unknown   Tramadol Other (See Comments)    CONFUSION     HOME MEDICATIONS: Outpatient Medications Prior to Visit  Medication Sig Dispense Refill   alendronate (FOSAMAX) 70 MG tablet Take 1 tablet (70 mg total) by mouth every 7 (seven) days. Take with a full glass of water on an empty stomach. 12 tablet 4   ALPRAZolam (XANAX) 0.25 MG tablet Take 0.25 mg by mouth at bedtime as needed for sleep.     amLODipine (NORVASC) 5 MG tablet Take 1 tablet (5 mg total) by mouth daily. 90 tablet 0   Ascorbic Acid (VITAMIN C PO) Take 1,000 mg by mouth.     b complex vitamins capsule Take 1 capsule by mouth daily.     CALCIUM PO Take 600 mg by mouth daily.     escitalopram (LEXAPRO) 5 MG tablet Take 5 mg by mouth daily.     ezetimibe (ZETIA) 10 MG tablet Take 1 tablet (10 mg total) by mouth daily. 90 tablet 0   hydrochlorothiazide (MICROZIDE) 12.5 MG capsule Take 1 capsule (12.5 mg total) by mouth daily. 90 capsule 0   pravastatin (PRAVACHOL) 40 MG tablet Take 1 tablet (40 mg total) by mouth every evening. NEED APPOINTMENT 90 tablet 0   valsartan (DIOVAN) 320 MG tablet TAKE ONE TABLET BY MOUTH ONE TIME DAILY 90 tablet 3   Vitamin D, Ergocalciferol, 50 MCG (2000 UT) CAPS Take by mouth.     donepezil (ARICEPT) 5 MG tablet TAKE 1 TAB DAILY FOR 4 WEEKS, THEN INCREASE TO 2 TABS DAILY 180 tablet 3   CO ENZYME Q-10 PO Take by mouth.     fluocinolone (VANOS) 0.01 % cream Apply topically 2 (two) times daily.     triamcinolone (KENALOG) 0.1 % paste Use as directed 1 application in the mouth or throat as needed.     UNABLE TO FIND 180 mg. Med Name: Aller tec     No facility-administered medications prior to visit.    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   CAD in native artery  06/25/2019   Cataract    unsure of which eye   Colon polyps    Environmental allergies    Essential hypertension 06/25/2019   Genetic testing 04/28/2017   Multi-Cancer panel (83 genes) @ [facility] - No pathogenic mutations detected   GERD (gastroesophageal reflux disease)    Glaucoma    High cholesterol    Hypertension    IBS (irritable bowel syndrome)    Memory change    Osteoporosis 06/2016   T score -2.8   PONV (postoperative nausea and vomiting)    Pure hypercholesterolemia 06/25/2019   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, 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    PAST SURGICAL HISTORY: Past Surgical History:  Procedure Laterality Date   ABDOMINAL HYSTERECTOMY     BLADDER SUSPENSION N/A 04/08/2016   Procedure: TRANSVAGINAL TAPE (TVT) PROCEDURE;  Surgeon: Princess Bruins, MD;  Location: [facility];  Service: Gynecology;  Laterality: N/A;   BREAST SURGERY     cyst removal   CARDIAC CATHETERIZATION     CATARACT EXTRACTION, BILATERAL     COLONOSCOPY     CYSTOSCOPY N/A 04/08/2016   Procedure: CYSTOSCOPY;  Surgeon: Princess Bruins, MD;  Location: [facility];  Service: Gynecology;  Laterality: N/A;   DENTAL SURGERY  03/31/2016   ELBOW FRACTURE SURGERY     EYE SURGERY Left    blood clot on retina   ROBOTIC ASSISTED TOTAL HYSTERECTOMY WITH BILATERAL SALPINGO OOPHERECTOMY Bilateral 04/08/2016   Procedure: ROBOTIC ASSISTED TOTAL HYSTERECTOMY WITH BILATERAL SALPINGO OOPHORECTOMY/Uterosacral Ligament Suspension;  Surgeon: Princess Bruins, MD;  Location: [facility];  Service: Gynecology;  Laterality: Bilateral;  Request 4 hrs.   TUBAL LIGATION     WRIST ARTHROSCOPY      FAMILY HISTORY: Family History  Problem Relation Age of Onset   Macular degeneration Mother    Hearing loss Mother    CAD Mother    Diabetes Father        dx in his 19s-60s   Heart disease Father    Rheumatic fever Father    Pancreatic cancer Brother 19       non-smoker; deceased 5   Cancer Maternal Grandmother         abdominal; unsure of primary; deceased 55   Heart disease Maternal Grandfather    Congestive Heart Failure Paternal Grandfather    Other Brother        at birth   Cancer Other        MGMs sister, abdominal; unsure of primary; deceased 61s   Pancreatic cancer Maternal Uncle 93       non-smoker; deceased 42   Alcohol abuse Maternal Uncle    Dementia Paternal Aunt    Congestive Heart Failure Paternal Uncle    Leukemia Cousin        female; daughter of mat uncle with pancreatic ca; deceased 45    SOCIAL HISTORY: Social History   Socioeconomic History   Marital status: Married    Spouse name: Not on file   Number of children: 1   Years of education: Not on file   Highest education level: Not on file  Occupational History   Not on file  Tobacco Use   Smoking status: Former    Types: Cigarettes    Quit date: 03/21/1969    Years since quitting: 52.9   Smokeless tobacco: Never  Vaping Use   Vaping Use: Never used  Substance and Sexual Activity   Alcohol use: Yes    Comment: 7 a week   Drug use: No   Sexual activity: Not Currently    Birth control/protection: Surgical    Comment: First intercouse at age 73. Only one partner.  Other Topics Concern   Not on file  Social History Narrative   11/17/21 lives with husband   Coffee 1-2 daily   Social Determinants of Health   Financial Resource Strain: Not on file  Food Insecurity: Not on file  Transportation Needs: Not on file  Physical Activity: Not on file  Stress: Not on file  Social Connections: Not on file  Intimate Partner Violence: Not on file   PHYSICAL EXAM  Vitals:   02/23/22 1417  BP: 139/64  Pulse: (!) 58  Weight: 143  lb 8 oz (65.1 kg)  Height: '5\' 2"'$  (1.575 m)   Body mass index is 26.25 kg/m.    02/23/2022    3:01 PM 11/17/2021    9:37 AM  Montreal Cognitive Assessment   Visuospatial/ Executive (0/5) 0 1  Naming (0/3) 3 2  Attention: Read list of digits (0/2) 2 2  Attention: Read list of letters (0/1)  1 0  Attention: Serial 7 subtraction starting at 100 (0/3) 1 0  Language: Repeat phrase (0/2) 2 0  Language : Fluency (0/1) 0 0  Abstraction (0/2) 2 2  Delayed Recall (0/5) 1 2  Orientation (0/6) 6 5  Total 18 14  Adjusted Score (based on education) 18     Generalized: Well developed, in no acute distress  Neurological examination  Mentation: Alert oriented to time, place, history taking. Follows all commands speech and language fluent Cranial nerve II-XII: Pupils were equal round reactive to light. Extraocular movements were full, visual field were full on confrontational test. Facial sensation and strength were normal.  Head turning and shoulder shrug  were normal and symmetric. Motor: The motor testing reveals 5 over 5 strength of all 4 extremities. Good symmetric motor tone is noted throughout.  Sensory: Sensory testing is intact to soft touch on all 4 extremities. No evidence of extinction is noted.  Coordination: Cerebellar testing reveals good finger-nose-finger and heel-to-shin bilaterally.  Gait and station: Gait is normal.  Reflexes: Deep tendon reflexes are symmetric and normal bilaterally.   DIAGNOSTIC DATA (LABS, IMAGING, TESTING) - I reviewed patient records, labs, notes, testing and imaging myself where available.  Lab Results  Component Value Date   WBC 5.9 05/25/2019   HGB 13.8 05/25/2019   HCT 42.0 05/25/2019   MCV 99.1 05/25/2019   PLT 282 05/25/2019      Component Value Date/Time   NA 143 03/17/2021 0819   K 4.2 03/17/2021 0819   CL 104 03/17/2021 0819   CO2 26 03/17/2021 0819   GLUCOSE 131 (H) 03/17/2021 0819   GLUCOSE 109 (H) 05/25/2019 1358   BUN 18 03/17/2021 0819   CREATININE 0.84 03/17/2021 0819   CALCIUM 9.0 03/17/2021 0819   PROT 7.0 03/31/2016 0935   ALBUMIN 4.0 03/31/2016 0935   AST 22 03/31/2016 0935   ALT 22 03/31/2016 0935   ALKPHOS 51 03/31/2016 0935   BILITOT 0.5 03/31/2016 0935   GFRNONAA 72 08/08/2019 0817   GFRAA 83 08/08/2019  0817   Lab Results  Component Value Date   CHOL 168 10/01/2019   HDL 51 10/01/2019   LDLCALC 95 10/01/2019   TRIG 126 10/01/2019   CHOLHDL 3.3 10/01/2019   No results found for: "HGBA1C" No results found for: "VITAMINB12" No results found for: "TSH"  Butler Denmark, AGNP-C, DNP 02/23/2022, 3:03 PM [facility] [address], [place], [place] [address] [phone]

## 2022-02-24 ENCOUNTER — Telehealth: Payer: Self-pay | Admitting: Neurology

## 2022-02-24 NOTE — Telephone Encounter (Signed)
Referral for Neuropsychology sent through [facility] to Dr. Ilean Skill. Phone: [phone]

## 2022-03-02 ENCOUNTER — Other Ambulatory Visit: Payer: Self-pay | Admitting: Cardiovascular Disease

## 2022-03-02 NOTE — Telephone Encounter (Signed)
Rx request sent to pharmacy.  

## 2022-03-10 ENCOUNTER — Ambulatory Visit (HOSPITAL_BASED_OUTPATIENT_CLINIC_OR_DEPARTMENT_OTHER): Payer: Medicare Other | Admitting: Family

## 2022-03-31 ENCOUNTER — Telehealth: Payer: Self-pay | Admitting: Neurology

## 2022-03-31 NOTE — Telephone Encounter (Signed)
I called the patient 3 times, she did not answer, I left a message.  Asked her to reduce the Namenda back to 5 mg twice a day.  Usually Aricept causes diarrhea not Namenda.  If her symptoms are no better next week we will stop the Namenda.  She reportedly did not have any diarrhea on just the Aricept.  I asked her to call back if any problems.

## 2022-03-31 NOTE — Telephone Encounter (Signed)
Pt states since she has been taking the memantine (NAMENDA) 10 MG tablet & donepezil (ARICEPT) 10 MG tablet, together she has had diarrhea.  Pt would like a call to discuss another treatment plan.

## 2022-04-01 ENCOUNTER — Other Ambulatory Visit: Payer: Self-pay | Admitting: Cardiovascular Disease

## 2022-04-01 NOTE — Telephone Encounter (Signed)
Please call pt to schedule overdue 1 year appointment with Dr. Oval Linsey or APP for refills. Thank you!

## 2022-04-01 NOTE — Telephone Encounter (Signed)
Pt called back. I read her the message from Judson Roch. Pt said this was helpful and thank you.

## 2022-04-04 ENCOUNTER — Telehealth (HOSPITAL_BASED_OUTPATIENT_CLINIC_OR_DEPARTMENT_OTHER): Payer: Self-pay | Admitting: Cardiovascular Disease

## 2022-04-04 NOTE — Telephone Encounter (Signed)
Left message for patient to call and schedule follow up with Dr. Oval Linsey / APP for [facility] refills

## 2022-04-05 ENCOUNTER — Other Ambulatory Visit (HOSPITAL_BASED_OUTPATIENT_CLINIC_OR_DEPARTMENT_OTHER): Payer: Self-pay | Admitting: Cardiovascular Disease

## 2022-04-05 NOTE — Telephone Encounter (Signed)
Pt was called yesterday, 04/04/22 to schedule overdue follow-up with Dr. Oval Linsey or APP for refills.

## 2022-04-12 ENCOUNTER — Encounter (HOSPITAL_BASED_OUTPATIENT_CLINIC_OR_DEPARTMENT_OTHER): Payer: Self-pay | Admitting: Family

## 2022-04-12 ENCOUNTER — Ambulatory Visit (INDEPENDENT_AMBULATORY_CARE_PROVIDER_SITE_OTHER): Payer: Medicare Other | Admitting: Family

## 2022-04-12 VITALS — BP 132/74 | HR 51 | Ht 62.0 in | Wt 147.3 lb

## 2022-04-12 DIAGNOSIS — I25118 Atherosclerotic heart disease of native coronary artery with other forms of angina pectoris: Secondary | ICD-10-CM

## 2022-04-12 DIAGNOSIS — I10 Essential (primary) hypertension: Secondary | ICD-10-CM

## 2022-04-12 DIAGNOSIS — E785 Hyperlipidemia, unspecified: Secondary | ICD-10-CM | POA: Diagnosis not present

## 2022-04-12 DIAGNOSIS — R252 Cramp and spasm: Secondary | ICD-10-CM

## 2022-04-12 MED ORDER — VALSARTAN 320 MG PO TABS: 320.0000 mg | ORAL_TABLET | Freq: Every day | ORAL | 3 refills | Status: DC

## 2022-04-12 MED ORDER — HYDROCHLOROTHIAZIDE 12.5 MG PO CAPS: 12.5000 mg | ORAL_CAPSULE | Freq: Every day | ORAL | 3 refills | Status: DC

## 2022-04-12 MED ORDER — PRAVASTATIN SODIUM 40 MG PO TABS: 40.0000 mg | ORAL_TABLET | Freq: Every evening | ORAL | 3 refills | Status: DC

## 2022-04-12 MED ORDER — AMLODIPINE BESYLATE 5 MG PO TABS: 5.0000 mg | ORAL_TABLET | Freq: Every day | ORAL | 3 refills | Status: DC

## 2022-04-12 MED ORDER — EZETIMIBE 10 MG PO TABS: 10.0000 mg | ORAL_TABLET | Freq: Every day | ORAL | 3 refills | Status: DC

## 2022-04-12 NOTE — Patient Instructions (Signed)
Medication Instructions:  Your Physician recommend you continue on your current medication as directed.    We have refilled your Cardiac Medications today!  *If you need a refill on your cardiac medications before your next appointment, please call your pharmacy*   Lab Work: Your physician recommends that you return for lab work today- CMP, Lipid Panel, Direct LDL, Magnesium   If you have labs (blood work) drawn today and your tests are completely normal, you will receive your results only by: MyChart Message (if you have MyChart) OR A paper copy in the mail If you have any lab test that is abnormal or we need to change your treatment, we will call you to review the results.  Follow-Up: At [facility], you and your health needs are our priority.  As part of our continuing mission to provide you with exceptional heart care, we have created designated Provider Care Teams.  These Care Teams include your primary Cardiologist (physician) and Advanced Practice Providers (APPs -  Physician Assistants and Nurse Practitioners) who all work together to provide you with the care you need, when you need it.  We recommend signing up for the patient portal called "MyChart".  Sign up information is provided on this After Visit Summary.  MyChart is used to connect with patients for Virtual Visits (Telemedicine).  Patients are able to view lab/test results, encounter notes, upcoming appointments, etc.  Non-urgent messages can be sent to your provider as well.   To learn more about what you can do with MyChart, go to [email or web address].    Your next appointment:   6 month(s)  Provider:   Skeet Latch, MD or Laurann Montana, NP    Other Instructions Heart Healthy Diet Recommendations: A low-salt diet is recommended. Meats should be grilled, baked, or boiled. Avoid fried foods. Focus on lean protein sources like fish or chicken with vegetables and fruits. The American Heart Association is  a Microbiologist!  American Heart Association Diet and Lifeystyle Recommendations   Exercise recommendations: The American Heart Association recommends 150 minutes of moderate intensity exercise weekly. Try 30 minutes of moderate intensity exercise 4-5 times per week. This could include walking, jogging, or swimming.

## 2022-04-12 NOTE — Progress Notes (Unsigned)
Office Visit    Patient Name: Sandra Wheeler Date of Encounter: 04/13/2022  PCP:  Marda Stalker, [place]  Cardiologist:  Skeet Latch, MD  Advanced Practice Provider:  No care team member to display Electrophysiologist:  None   Chief Complaint    [name] is a 82 y.o. 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   CAD in native artery 06/25/2019   Cataract    unsure of which eye   Colon polyps    Environmental allergies    Essential hypertension 06/25/2019   Genetic testing 04/28/2017   Multi-Cancer panel (83 genes) @ [facility] - No pathogenic mutations detected   GERD (gastroesophageal reflux disease)    Glaucoma    High cholesterol    Hypertension    IBS (irritable bowel syndrome)    Memory change    Osteoporosis 06/2016   T score -2.8   PONV (postoperative nausea and vomiting)    Pure hypercholesterolemia 06/25/2019   Seizures ([place])    childhood, 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   Past Surgical History:  Procedure Laterality Date   ABDOMINAL HYSTERECTOMY     BLADDER SUSPENSION N/A 04/08/2016   Procedure: TRANSVAGINAL TAPE (TVT) PROCEDURE;  Surgeon: Princess Bruins, MD;  Location: [facility];  Service: Gynecology;  Laterality: N/A;   BREAST SURGERY     cyst removal   CARDIAC CATHETERIZATION     CATARACT EXTRACTION, BILATERAL     COLONOSCOPY     CYSTOSCOPY N/A 04/08/2016   Procedure: CYSTOSCOPY;  Surgeon: Princess Bruins, MD;  Location: [facility];  Service: Gynecology;  Laterality: N/A;   DENTAL SURGERY  03/31/2016   ELBOW FRACTURE SURGERY     EYE SURGERY Left    blood clot on retina   ROBOTIC ASSISTED TOTAL HYSTERECTOMY WITH BILATERAL SALPINGO OOPHERECTOMY Bilateral 04/08/2016   Procedure: ROBOTIC ASSISTED TOTAL HYSTERECTOMY WITH BILATERAL SALPINGO  OOPHORECTOMY/Uterosacral Ligament Suspension;  Surgeon: Princess Bruins, MD;  Location: [facility];  Service: Gynecology;  Laterality: Bilateral;  Request 4 hrs.   TUBAL LIGATION     WRIST ARTHROSCOPY      Allergies  Allergies  Allergen Reactions   Codeine Other (See Comments)    Knocked her out Other reaction(s): Unknown   Meloxicam Other (See Comments)    BLURRY VISION, DIZZY, CONFUSION  Other reaction(s): Unknown   Sulfamethoxazole-Trimethoprim     Other reaction(s): Unknown   Tramadol Other (See Comments)    CONFUSION     History of Present Illness    Sandra Wheeler is a 82 y.o. female with a hx of hypertension, hyperlipidemia, nonobstructive coronary artery disease last seen 03/30/2021 by pharmacy team.  Prior coronary CTA calcium score 78 in 2009. Calcified plaque in RCA and distal LM. No obstructive disease.   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 She was started on Donepezil which improved word finding. At follow up 02/23/22 Namenda added and referred for formal neuropsychological testing.  She presents today for follow-up with her husband. Resides at [facility] and enjoys participating in activities there. Notes her blood pressure at home is routinely in the 130s.  She notes an occasional headache and wonders if that is related to her medications. Reports no shortness of breath nor dyspnea on exertion. Reports no chest pain,  pressure, or tightness. No edema, orthopnea, PND. Reports no palpitations.    EKGs/Labs/Other Studies Reviewed:   The following studies were reviewed today:   EKG:  EKG is  ordered today.  The ekg ordered today demonstrates sinus bradycardia 51 bpm with no acute ST/T wave changes.   Recent Labs: 04/12/2022: ALT 18; BUN 16; Creatinine, Ser 0.86; Magnesium 2.3; Potassium 4.6; Sodium 141  Recent Lipid Panel    Component Value Date/Time   CHOL 218 (H) 04/12/2022 1043   TRIG 170 (H) 04/12/2022 1043   HDL 57  04/12/2022 1043   CHOLHDL 3.8 04/12/2022 1043   LDLCALC 131 (H) 04/12/2022 1043   LDLDIRECT 145 (H) 04/12/2022 1043    Home Medications   Current Meds  Medication Sig   alendronate (FOSAMAX) 70 MG tablet Take 1 tablet (70 mg total) by mouth every 7 (seven) days. Take with a full glass of water on an empty stomach.   ALPRAZolam (XANAX) 0.25 MG tablet Take 0.25 mg by mouth at bedtime as needed for sleep.   Ascorbic Acid (VITAMIN C PO) Take 1,000 mg by mouth.   b complex vitamins capsule Take 1 capsule by mouth daily.   CALCIUM PO Take 600 mg by mouth daily.   donepezil (ARICEPT) 10 MG tablet Take 1 tablet (10 mg total) by mouth daily.   escitalopram (LEXAPRO) 5 MG tablet Take 5 mg by mouth daily.   memantine (NAMENDA) 10 MG tablet Take 1/2 tablet twice daily x 2 weeks, then take 1 tablet twice daily   Vitamin D, Ergocalciferol, 50 MCG (2000 UT) CAPS Take by mouth.   [DISCONTINUED] amLODipine (NORVASC) 5 MG tablet Take 1 tablet (5 mg total) by mouth daily.   [DISCONTINUED] ezetimibe (ZETIA) 10 MG tablet Take 1 tablet (10 mg total) by mouth daily.   [DISCONTINUED] hydrochlorothiazide (MICROZIDE) 12.5 MG capsule Take 1 capsule (12.5 mg total) by mouth daily.   [DISCONTINUED] pravastatin (PRAVACHOL) 40 MG tablet Take 1 tablet (40 mg total) by mouth every evening. NEED APPOINTMENT   [DISCONTINUED] valsartan (DIOVAN) 320 MG tablet Take 1 tablet (320 mg total) by mouth daily. Please schedule appointment with Dr. Oval Linsey for refills.     Review of Systems      All other systems reviewed and are otherwise negative except as noted above.  Physical Exam    VS:  BP 132/74   Pulse (!) 51   Ht '5\' 2"'$  (1.575 m)   Wt 147 lb 4.8 oz (66.8 kg)   SpO2 97%   BMI 26.94 kg/m  , BMI Body mass index is 26.94 kg/m.  Wt Readings from Last 3 Encounters:  04/12/22 147 lb 4.8 oz (66.8 kg)  02/23/22 143 lb 8 oz (65.1 kg)  11/17/21 144 lb (65.3 kg)     GEN: Well nourished, well developed, in no acute  distress. HEENT: normal. Neck: Supple, no JVD, carotid bruits, or masses. Cardiac: RRR, no murmurs, rubs, or gallops. No clubbing, cyanosis, edema.  Radials/PT 2+ and equal bilaterally.  Respiratory:  Respirations regular and unlabored, clear to auscultation bilaterally. GI: Soft, nontender, nondistended. MS: No deformity or atrophy. Skin: Warm and dry, no rash. Neuro:  Strength and sensation are intact. Psych: Normal affect.  Assessment & Plan    HTN - BP reasonably well controlled. Avoid hypotension due to fall risk. Continue Amlodipine '5mg'$  QD, HCTZ 12.'5mg'$  QD, Valsartan '320mg'$  QD. Discussed to monitor BP at home at least 2 hours after medications and sitting for 5-10 minutes. She will report if  BP consistently <130/80 at home and if so plan to increase Amlodipine. Home readings have been in the 120s.   HLD - LDL goal <70. Continue Pravastatin '40mg'$  QD, Zetia '10mg'$  QD. CMP, lipid panel, direct LDL today.   Memory loss / Bradycardia - Follows with neurology. On Aricept and Namena. Anticipate Aricept is contributory to bradycardia. She is asymptomatic with no lightheadedness, dizziness. She was educated to report if such symptoms occur and could consider ambulatory monitor at that time. CMP, magnesium today due to bradycardia.   CAD - Nonobstructive. Stable with no anginal symptoms. No indication for ischemic evaluation.  GDMT pravastatin, zetia. No BB due to bradycardia.          Disposition: Follow up in 6 month(s) with Skeet Latch, MD or APP.  Signed, Loel Dubonnet, NP 04/13/2022, 3:52 PM [place]

## 2022-04-13 ENCOUNTER — Encounter (HOSPITAL_BASED_OUTPATIENT_CLINIC_OR_DEPARTMENT_OTHER): Payer: Self-pay | Admitting: Family

## 2022-04-13 LAB — COMPREHENSIVE METABOLIC PANEL
ALT: 18 IU/L (ref 0–32)
AST: 21 IU/L (ref 0–40)
Albumin/Globulin Ratio: 1.9 (ref 1.2–2.2)
Albumin: 4.5 g/dL (ref 3.7–4.7)
Alkaline Phosphatase: 58 IU/L (ref 44–121)
BUN/Creatinine Ratio: 19 (ref 12–28)
BUN: 16 mg/dL (ref 8–27)
Bilirubin Total: 0.3 mg/dL (ref 0.0–1.2)
CO2: 27 mmol/L (ref 20–29)
Calcium: 10.2 mg/dL (ref 8.7–10.3)
Chloride: 102 mmol/L (ref 96–106)
Creatinine, Ser: 0.86 mg/dL (ref 0.57–1.00)
Globulin, Total: 2.4 g/dL (ref 1.5–4.5)
Glucose: 93 mg/dL (ref 70–99)
Potassium: 4.6 mmol/L (ref 3.5–5.2)
Sodium: 141 mmol/L (ref 134–144)
Total Protein: 6.9 g/dL (ref 6.0–8.5)
eGFR: 68 mL/min/{1.73_m2} (ref >59)

## 2022-04-13 LAB — LIPID PANEL
Chol/HDL Ratio: 3.8 ratio (ref 0.0–4.4)
Cholesterol, Total: 218 mg/dL — ABNORMAL HIGH (ref 100–199)
HDL: 57 mg/dL (ref >39)
LDL Chol Calc (NIH): 131 mg/dL — ABNORMAL HIGH (ref 0–99)
Triglycerides: 170 mg/dL — ABNORMAL HIGH (ref 0–149)
VLDL Cholesterol Cal: 30 mg/dL (ref 5–40)

## 2022-04-13 LAB — LDL CHOLESTEROL, DIRECT: LDL Direct: 145 mg/dL — ABNORMAL HIGH (ref 0–99)

## 2022-04-13 LAB — MAGNESIUM: Magnesium: 2.3 mg/dL (ref 1.6–2.3)

## 2022-04-13 NOTE — Telephone Encounter (Signed)
Patient was seen 04/12/22 by Laurann Montana, NP

## 2022-04-14 ENCOUNTER — Telehealth (HOSPITAL_BASED_OUTPATIENT_CLINIC_OR_DEPARTMENT_OTHER): Payer: Self-pay

## 2022-04-14 DIAGNOSIS — E785 Hyperlipidemia, unspecified: Secondary | ICD-10-CM

## 2022-04-14 DIAGNOSIS — I10 Essential (primary) hypertension: Secondary | ICD-10-CM

## 2022-04-14 DIAGNOSIS — I25118 Atherosclerotic heart disease of native coronary artery with other forms of angina pectoris: Secondary | ICD-10-CM

## 2022-04-14 DIAGNOSIS — R252 Cramp and spasm: Secondary | ICD-10-CM

## 2022-04-14 MED ORDER — EZETIMIBE 10 MG PO TABS: 10.0000 mg | ORAL_TABLET | Freq: Every day | ORAL | 3 refills | Status: DC

## 2022-04-14 MED ORDER — PRAVASTATIN SODIUM 40 MG PO TABS: 40.0000 mg | ORAL_TABLET | Freq: Every evening | ORAL | 3 refills | Status: DC

## 2022-04-14 NOTE — Telephone Encounter (Signed)
Patient returned call, results reviewed with patient who verbalizes understanding. She states she was out of pravastatin, but will restart and have the labs checked in 2 months.          ----- Message from Loel Dubonnet, NP sent at 04/13/2022  4:02 PM EST ----- Normal kidneys, liver, electrolytes. Cholesterol number have increased. I suspect she may have been out of her Pravastatin and not realized it.    If she thinks she was out of Pravastatin: Resume Pravastatin, Zetia with FLP/LFT in 2 months.   If she thinks she was taking Pravastatin: Stop Pravastatin. Start Rosuvastatin '20mg'$  QD. Continue Zetia. FLP/LFT in 2 months.

## 2022-04-14 NOTE — Telephone Encounter (Addendum)
Left message for patient to call back     ----- Message from Loel Dubonnet, NP sent at 04/13/2022  4:02 PM EST ----- Normal kidneys, liver, electrolytes. Cholesterol number have increased. I suspect she may have been out of her Pravastatin and not realized it.   If she thinks she was out of Pravastatin: Resume Pravastatin, Zetia with FLP/LFT in 2 months.  If she thinks she was taking Pravastatin: Stop Pravastatin. Start Rosuvastatin '20mg'$  QD. Continue Zetia. FLP/LFT in 2 months.

## 2022-04-14 NOTE — Addendum Note (Signed)
Addended by: Gerald Stabs on: 04/14/2022 11:52 AM   Modules accepted: Orders

## 2022-06-01 ENCOUNTER — Encounter: Payer: Self-pay | Admitting: Internal Medicine

## 2022-06-01 ENCOUNTER — Non-Acute Institutional Stay: Payer: Medicare Other | Admitting: Internal Medicine

## 2022-06-01 VITALS — BP 132/68 | HR 70 | Temp 98.1°F | Resp 16 | Ht 62.0 in | Wt 139.2 lb

## 2022-06-01 DIAGNOSIS — I2510 Atherosclerotic heart disease of native coronary artery without angina pectoris: Secondary | ICD-10-CM

## 2022-06-01 DIAGNOSIS — E7800 Pure hypercholesterolemia, unspecified: Secondary | ICD-10-CM

## 2022-06-01 DIAGNOSIS — M810 Age-related osteoporosis without current pathological fracture: Secondary | ICD-10-CM

## 2022-06-01 DIAGNOSIS — I10 Essential (primary) hypertension: Secondary | ICD-10-CM

## 2022-06-01 DIAGNOSIS — F03A0 Unspecified dementia, mild, without behavioral disturbance, psychotic disturbance, mood disturbance, and anxiety: Secondary | ICD-10-CM

## 2022-06-01 MED ORDER — DONEPEZIL HCL 5 MG PO TABS: 5.0000 mg | ORAL_TABLET | Freq: Every day | ORAL | 0 refills | Status: DC

## 2022-06-01 NOTE — Progress Notes (Signed)
Location:  [facility] (12)  Provider:   Code Status:  Goals of Care:     06/01/2022    9:15 AM  Advanced Directives  Type of Academic librarian;Living will;Out of facility DNR (pink MOST or yellow form)  Does patient want to make changes to medical advance directive? No - Patient declined  Copy of [place] in Chart? No - copy requested     Chief Complaint  Patient presents with   New Patient (Initial Visit)    Patient is here to establish care.   Quality Metric Gaps    Awv     HPI: Patient is a 82 y.o. female seen today for medical management of chronic diseases.    Patient has h/o  Early Dementia Patient had work up with Neurology and scored 14/30 on Her [place] She was started on Aricept and Namenda She says she feels like she is much better concern then Her words are better She still manages everything herself Even Drives with no issues MRI was negative for any acute disease just Atrophy  GI Symptoms Patient is having some gi Issues Including fullness Nausea poor Appetite She also looks like has h/o Stool incontinence and has gotten worse recently Was seen by Dr Collene Mares with recommendation to cut back on her sweets but that has not helped  CAD HTN and HLD Follows with Dr Oval Linsey Has know Non obstructive heart disease Not symptomatic now  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   CAD in native artery 06/25/2019   Cataract    unsure of which eye   Colon polyps    Environmental allergies    Essential hypertension 06/25/2019   Genetic testing 04/28/2017   Multi-Cancer panel (83 genes) @ [facility] - No pathogenic mutations detected   GERD  (gastroesophageal reflux disease)    Glaucoma    High cholesterol    Hypertension    IBS (irritable bowel syndrome)    Memory change    Osteoporosis 06/2016   T score -2.8   PONV (postoperative nausea and vomiting)    Pure hypercholesterolemia 06/25/2019   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, 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    Past Surgical History:  Procedure Laterality Date   ABDOMINAL HYSTERECTOMY     BLADDER SUSPENSION N/A 04/08/2016   Procedure: TRANSVAGINAL TAPE (TVT) PROCEDURE;  Surgeon: Princess Bruins, MD;  Location: [facility];  Service: Gynecology;  Laterality: N/A;   BREAST SURGERY     cyst removal   CARDIAC CATHETERIZATION     CATARACT EXTRACTION, BILATERAL     COLONOSCOPY     CYSTOSCOPY N/A 04/08/2016   Procedure: CYSTOSCOPY;  Surgeon: Princess Bruins, MD;  Location: [facility];  Service: Gynecology;  Laterality: N/A;   DENTAL SURGERY  03/31/2016   ELBOW FRACTURE SURGERY     EYE SURGERY Left    blood clot on retina   ROBOTIC ASSISTED TOTAL HYSTERECTOMY WITH BILATERAL SALPINGO OOPHERECTOMY Bilateral 04/08/2016   Procedure: ROBOTIC ASSISTED TOTAL HYSTERECTOMY WITH BILATERAL SALPINGO OOPHORECTOMY/Uterosacral Ligament Suspension;  Surgeon: Princess Bruins, MD;  Location: [facility];  Service: Gynecology;  Laterality: Bilateral;  Request 4 hrs.   TUBAL  LIGATION     WRIST ARTHROSCOPY      Allergies  Allergen Reactions   Codeine Other (See Comments)    Knocked her out Other reaction(s): Unknown   Meloxicam Other (See Comments)    BLURRY VISION, DIZZY, CONFUSION  Other reaction(s): Unknown   Sulfamethoxazole-Trimethoprim     Other reaction(s): Unknown   Tramadol Other (See Comments)    CONFUSION     Outpatient Encounter Medications as of 06/01/2022  Medication Sig   alendronate (FOSAMAX) 70 MG tablet Take 1 tablet (70 mg total) by mouth every 7 (seven) days. Take with a full glass of water on an empty stomach.   amLODipine (NORVASC) 5 MG tablet Take 1 tablet (5 mg total)  by mouth daily.   CALCIUM PO Take 600 mg by mouth daily.   donepezil (ARICEPT) 10 MG tablet Take 1 tablet (10 mg total) by mouth daily.   ezetimibe (ZETIA) 10 MG tablet Take 1 tablet (10 mg total) by mouth daily.   hydrochlorothiazide (MICROZIDE) 12.5 MG capsule Take 1 capsule (12.5 mg total) by mouth daily.   memantine (NAMENDA) 10 MG tablet Take 1/2 tablet twice daily x 2 weeks, then take 1 tablet twice daily   pravastatin (PRAVACHOL) 40 MG tablet Take 1 tablet (40 mg total) by mouth every evening.   timolol (TIMOPTIC) 0.25 % ophthalmic solution Place 1 drop into both eyes daily.   triamcinolone cream (KENALOG) 0.1 % Apply 1 Application topically 2 (two) times daily.   valsartan (DIOVAN) 320 MG tablet Take 1 tablet (320 mg total) by mouth daily. Please schedule appointment with Dr. Oval Linsey for refills.   [DISCONTINUED] ALPRAZolam (XANAX) 0.25 MG tablet Take 0.25 mg by mouth at bedtime as needed for sleep.   Ascorbic Acid (VITAMIN C PO) Take 1,000 mg by mouth. (Patient not taking: Reported on 06/01/2022)   b complex vitamins capsule Take 1 capsule by mouth daily. (Patient not taking: Reported on 06/01/2022)   fluocinonide cream (LIDEX) [id number] % Apply 1 Application topically 2 (two) times daily.   Vitamin D, Ergocalciferol, 50 MCG (2000 UT) CAPS Take by mouth. (Patient not taking: Reported on 06/01/2022)   [DISCONTINUED] escitalopram (LEXAPRO) 5 MG tablet Take 5 mg by mouth daily. (Patient not taking: Reported on 06/01/2022)   No facility-administered encounter medications on file as of 06/01/2022.    Review of Systems:  Review of Systems  Constitutional:  Negative for activity change and appetite change.  HENT: Negative.    Eyes:  Positive for visual disturbance.  Respiratory:  Negative for cough and shortness of breath.   Cardiovascular:  Negative for leg swelling.  Gastrointestinal:  Positive for abdominal distention, diarrhea and nausea. Negative for constipation.  Genitourinary: Negative.    Musculoskeletal:  Negative for arthralgias, gait problem and myalgias.  Skin: Negative.   Neurological:  Negative for dizziness and weakness.  Psychiatric/Behavioral:  Negative for confusion, dysphoric mood and sleep disturbance.     Health Maintenance  Topic Date Due   Medicare Annual Wellness (AWV)  Never done   COVID-19 Vaccine (5 - 2023-24 season) 06/17/2022 (Originally 02/11/2022)   DTaP/Tdap/Td (3 - Td or Tdap) 07/05/2029   Pneumonia Vaccine 68+ Years old  Completed   INFLUENZA VACCINE  Completed   DEXA SCAN  Completed   Zoster Vaccines- Shingrix  Completed   HPV VACCINES  Aged Out    Physical Exam: Vitals:   06/01/22 0903  BP: 132/68  Pulse: 70  Resp: 16  Temp: 98.1 F (36.7 C)  TempSrc:  Temporal  SpO2: 96%  Weight: 139 lb 3.2 oz (63.1 kg)  Height: '5\' 2"'$  (1.575 m)   Body mass index is 25.46 kg/m. Physical Exam Vitals reviewed.  Constitutional:      Appearance: Normal appearance.  HENT:     Head: Normocephalic.     Nose: Nose normal.     Mouth/Throat:     Mouth: Mucous membranes are moist.     Pharynx: Oropharynx is clear.  Eyes:     Pupils: Pupils are equal, round, and reactive to light.  Cardiovascular:     Rate and Rhythm: Regular rhythm. Bradycardia present.     Pulses: Normal pulses.     Heart sounds: Normal heart sounds. No murmur heard. Pulmonary:     Effort: Pulmonary effort is normal.     Breath sounds: Normal breath sounds.  Abdominal:     General: Abdomen is flat. Bowel sounds are normal.     Palpations: Abdomen is soft.  Musculoskeletal:        General: No swelling.     Cervical back: Neck supple.  Skin:    General: Skin is warm.  Neurological:     General: No focal deficit present.     Mental Status: She is alert and oriented to person, place, and time.  Psychiatric:        Mood and Affect: Mood normal.        Thought Content: Thought content normal.     Labs reviewed: Basic Metabolic Panel: Recent Labs    04/12/22 1043   NA 141  K 4.6  CL 102  CO2 27  GLUCOSE 93  BUN 16  CREATININE 0.86  CALCIUM 10.2  MG 2.3   Liver Function Tests: Recent Labs    04/12/22 1043  AST 21  ALT 18  ALKPHOS 58  BILITOT 0.3  PROT 6.9  ALBUMIN 4.5   No results for input(s): "LIPASE", "AMYLASE" in the last 8760 hours. No results for input(s): "AMMONIA" in the last 8760 hours. CBC: No results for input(s): "WBC", "NEUTROABS", "HGB", "HCT", "MCV", "PLT" in the last 8760 hours. Lipid Panel: Recent Labs    04/12/22 1043  CHOL 218*  HDL 57  LDLCALC 131*  TRIG 170*  CHOLHDL 3.8  LDLDIRECT 145*   No results found for: "HGBA1C"  Procedures since last visit: No results found.  Assessment/Plan 1. Essential hypertension BP managed with help of Cardiology It seems to be doing well  2. CAD in native artery On statin therapy ? Not on aspirin   3. Osteoporosis without current pathological fracture, unspecified osteoporosis type Just started back on Fosamax Managed with GYN  4. Mild dementia, Did have anxiety and depression before but is much better now Taken off Lexapro and xanax Very highly functional Still driving On Namenda and Aricpet We talked about trying lower dose of Aricept due to her recent GI symptoms She will come in 4 weeks to reval. Will change her Aricept to 5 mg QD   5. Pure hypercholesterolemia On statin and Zetia 6 Fatigue  Will order some labs     Labs/tests ordered:  * No order type specified * Next appt:  Visit date not found

## 2022-06-16 ENCOUNTER — Other Ambulatory Visit: Payer: Medicare Other

## 2022-06-17 ENCOUNTER — Other Ambulatory Visit: Payer: Self-pay | Admitting: Obstetrics & Gynecology

## 2022-06-17 LAB — LIPID PANEL
Cholesterol: 150 mg/dL (ref <200)
HDL: 52 mg/dL (ref >=50)
LDL Cholesterol (Calc): 75 mg/dL (calc)
Non-HDL Cholesterol (Calc): 98 mg/dL (calc) (ref <130)
Total CHOL/HDL Ratio: 2.9 (calc) (ref <5.0)
Triglycerides: 157 mg/dL — ABNORMAL HIGH (ref <150)

## 2022-06-17 LAB — CBC WITH DIFFERENTIAL/PLATELET
Absolute Monocytes: 329 cells/uL (ref 200–950)
Basophils Absolute: 41 cells/uL (ref 0–200)
Basophils Relative: 0.9 %
Eosinophils Absolute: 171 cells/uL (ref 15–500)
Eosinophils Relative: 3.8 %
HCT: 37.9 % (ref 35.0–45.0)
Hemoglobin: 12.9 g/dL (ref 11.7–15.5)
Lymphs Abs: 1044 cells/uL (ref 850–3900)
MCH: 32.3 pg (ref 27.0–33.0)
MCHC: 34 g/dL (ref 32.0–36.0)
MCV: 95 fL (ref 80.0–100.0)
MPV: 9 fL (ref 7.5–12.5)
Monocytes Relative: 7.3 %
Neutro Abs: 2916 cells/uL (ref 1500–7800)
Neutrophils Relative %: 64.8 %
Platelets: 277 10*3/uL (ref 140–400)
RBC: 3.99 10*6/uL (ref 3.80–5.10)
RDW: 12.1 % (ref 11.0–15.0)
Total Lymphocyte: 23.2 %
WBC: 4.5 10*3/uL (ref 3.8–10.8)

## 2022-06-17 LAB — COMPLETE METABOLIC PANEL WITH GFR
AG Ratio: 1.5 (calc) (ref 1.0–2.5)
ALT: 20 U/L (ref 6–29)
AST: 20 U/L (ref 10–35)
Albumin: 3.9 g/dL (ref 3.6–5.1)
Alkaline phosphatase (APISO): 49 U/L (ref 37–153)
BUN: 20 mg/dL (ref 7–25)
CO2: 25 mmol/L (ref 20–32)
Calcium: 8.8 mg/dL (ref 8.6–10.4)
Chloride: 106 mmol/L (ref 98–110)
Creat: 0.7 mg/dL (ref 0.60–0.95)
Globulin: 2.6 g/dL (calc) (ref 1.9–3.7)
Glucose, Bld: 102 mg/dL — ABNORMAL HIGH (ref 65–99)
Potassium: 3.9 mmol/L (ref 3.5–5.3)
Sodium: 141 mmol/L (ref 135–146)
Total Bilirubin: 0.4 mg/dL (ref 0.2–1.2)
Total Protein: 6.5 g/dL (ref 6.1–8.1)
eGFR: 87 mL/min/{1.73_m2} (ref >=60)

## 2022-06-17 LAB — TSH: TSH: 5.42 mIU/L — ABNORMAL HIGH (ref 0.40–4.50)

## 2022-06-17 LAB — VITAMIN D 25 HYDROXY (VIT D DEFICIENCY, FRACTURES): Vit D, 25-Hydroxy: 43 ng/mL (ref 30–100)

## 2022-06-20 NOTE — Telephone Encounter (Signed)
Med refill request: FosaMax Last AEX: 05/04/21 Next AEX: not scheduled Last MMG (if hormonal med) 11/24/20 BI-RADS CAT 2 benign Last BD: 05/04/21, osteoporosis Refill authorized: Please Advise?

## 2022-06-29 ENCOUNTER — Encounter: Payer: Self-pay | Admitting: Internal Medicine

## 2022-06-29 ENCOUNTER — Non-Acute Institutional Stay: Payer: Medicare Other | Admitting: Internal Medicine

## 2022-06-29 VITALS — BP 132/66 | HR 62 | Temp 97.6°F | Resp 16 | Ht 62.0 in | Wt 140.0 lb

## 2022-06-29 DIAGNOSIS — I2510 Atherosclerotic heart disease of native coronary artery without angina pectoris: Secondary | ICD-10-CM

## 2022-06-29 DIAGNOSIS — E7800 Pure hypercholesterolemia, unspecified: Secondary | ICD-10-CM

## 2022-06-29 DIAGNOSIS — R42 Dizziness and giddiness: Secondary | ICD-10-CM

## 2022-06-29 DIAGNOSIS — I10 Essential (primary) hypertension: Secondary | ICD-10-CM

## 2022-06-29 DIAGNOSIS — M810 Age-related osteoporosis without current pathological fracture: Secondary | ICD-10-CM

## 2022-06-29 DIAGNOSIS — F03A0 Unspecified dementia, mild, without behavioral disturbance, psychotic disturbance, mood disturbance, and anxiety: Secondary | ICD-10-CM | POA: Diagnosis not present

## 2022-06-29 NOTE — Progress Notes (Addendum)
[facility] (12)  Provider:   Code Status:  Goals of Care:     06/29/2022    9:08 AM  Advanced Directives  Type of Advance Directive Healthcare Power of [place];Living will;Out of facility DNR (pink MOST or yellow form)  Does patient want to make changes to medical advance directive? No - Patient declined  Copy of Healthcare Power of Attorney in Chart? No - copy requested     Chief Complaint  Patient presents with   Medical Management of Chronic Issues    Patient is here for a follow up. Patient states she was having some dizziness and went to the pharmacy and got some medication and its helping but she would like to know the cause    HPI: Patient is a 82 y.o. female seen today for an acute visit for Dizziness  Lives with her husband in [facility] in [place]  Patient came to see me today for her complaint of dizziness Is not able to describe me her symptoms well Says she just feels different in her head And denies any weakness in her legs.  Denies any syncope denies any feeling of passing out.  Denies any vertigo-like symptoms. She is not sure how long it has been going on but probably for 3 to 6 weeks  She had 1 previous PCP visit on 05/09/2022 for her dizziness She has had her MRI done in September/23 which showed cortical atrophy and some microvascular ischemic changes She also has a history of visual Loss in Left Eye due to ? Thrombosis Poor Depth Perception ability leading to her dizziness  Patient also continues to have history of stool incontinence.  Had seen Dr. Loreta Ave who had recommended to cut back on her sweet but that has not helped. I had recommended to reduce the dose of Aricept to 5 mg but patient did not do it. 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   Cataract    unsure of which eye   Colon polyps    Environmental allergies    Essential hypertension 06/25/2019   Genetic testing 04/28/2017   Multi-Cancer panel (83 genes) @ [facility] - No pathogenic mutations detected   GERD (gastroesophageal reflux disease)    Glaucoma    High cholesterol    Hypertension    IBS (irritable bowel syndrome)    Memory change    Osteoporosis 06/2016   T score -2.8   PONV (postoperative nausea and vomiting)    Pure hypercholesterolemia 06/25/2019   Seizures    childhood, 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    Past Surgical History:  Procedure Laterality Date   ABDOMINAL HYSTERECTOMY     BLADDER SUSPENSION N/A 04/08/2016   Procedure: TRANSVAGINAL TAPE (TVT) PROCEDURE;  Surgeon: Genia Del, MD;  Location: [facility];  Service: Gynecology;  Laterality: N/A;   BREAST SURGERY     cyst removal   CARDIAC CATHETERIZATION     CATARACT EXTRACTION, BILATERAL     COLONOSCOPY     CYSTOSCOPY N/A 04/08/2016   Procedure: CYSTOSCOPY;  Surgeon: Genia DelMarie-Lyne Lavoie, MD;  Location: [facility];  Service: Gynecology;  Laterality: N/A;   DENTAL SURGERY  03/31/2016   ELBOW FRACTURE SURGERY     EYE SURGERY Left    blood clot on retina   ROBOTIC ASSISTED TOTAL HYSTERECTOMY WITH BILATERAL SALPINGO OOPHERECTOMY Bilateral 04/08/2016   Procedure: ROBOTIC ASSISTED TOTAL HYSTERECTOMY WITH BILATERAL SALPINGO OOPHORECTOMY/Uterosacral Ligament Suspension;  Surgeon: Genia DelMarie-Lyne Lavoie, MD;  Location: [facility];  Service: Gynecology;  Laterality: Bilateral;  Request 4 hrs.   TUBAL LIGATION      WRIST ARTHROSCOPY      Allergies  Allergen Reactions   Codeine Other (See Comments)    Knocked her out Other reaction(s): Unknown   Meloxicam Other (See Comments)    BLURRY VISION, DIZZY, CONFUSION  Other reaction(s): Unknown   Sulfamethoxazole-Trimethoprim     Other reaction(s): Unknown   Tramadol Other (See Comments)    CONFUSION     Outpatient Encounter Medications as of 06/29/2022  Medication Sig   alendronate (FOSAMAX) 70 MG tablet Take 1 tablet by mouth every 7 days. Take with a full glass of water on an empty stomach   ALPRAZolam (XANAX) 0.25 MG tablet 0.25 mg.   amLODipine (NORVASC) 5 MG tablet Take 1 tablet (5 mg total) by mouth daily.   donepezil (ARICEPT) 5 MG tablet Take 1 tablet (5 mg total) by mouth at bedtime.   ezetimibe (ZETIA) 10 MG tablet Take 1 tablet (10 mg total) by mouth daily.   fluocinonide cream (LIDEX) 0.05 % Apply 1 Application topically 2 (two) times daily.   hydrochlorothiazide (MICROZIDE) 12.5 MG capsule Take 1 capsule (12.5 mg total) by mouth daily.   memantine (NAMENDA) 10 MG tablet Take 1/2 tablet twice daily x 2 weeks, then take 1 tablet twice daily   Multiple Vitamin (MULTIVITAMIN WITH MINERALS) TABS tablet Take 1 tablet by mouth daily.   pravastatin (PRAVACHOL) 40 MG tablet Take 1 tablet (40 mg total) by mouth every evening.   timolol (TIMOPTIC) 0.25 % ophthalmic solution Place 1 drop into both eyes daily.   valsartan (DIOVAN) 320 MG tablet Take 1 tablet (320 mg total) by mouth daily. Please schedule appointment with Dr. Duke Salviaandolph for refills.   CALCIUM PO Take 600 mg by mouth daily.   triamcinolone cream (KENALOG) 0.1 % Apply 1 Application topically 2 (two) times daily.   Vitamin D, Ergocalciferol, 50 MCG (2000 UT) CAPS Take by mouth. (Patient not taking: Reported on 06/01/2022)   No facility-administered encounter medications on file as of 06/29/2022.    Review of Systems:  Review of Systems  Constitutional:  Negative for activity change and  appetite change.  HENT: Negative.    Respiratory:  Negative for cough and shortness of breath.   Cardiovascular:  Negative for leg swelling.  Gastrointestinal:  Negative for constipation.  Genitourinary: Negative.   Musculoskeletal:  Negative for arthralgias, gait problem and myalgias.  Skin: Negative.   Neurological:  Positive for dizziness. Negative for weakness.  Psychiatric/Behavioral:  Positive for confusion. Negative for dysphoric mood and sleep disturbance.     Health Maintenance  Topic Date Due   Medicare Annual Wellness (AWV)  Never done   COVID-19 Vaccine (5 - 2023-24 season) 07/15/2022 (Originally 02/11/2022)   INFLUENZA VACCINE  10/20/2022   DTaP/Tdap/Td (3 -  Td or Tdap) 07/05/2029   Pneumonia Vaccine 22+ Years old  Completed   DEXA SCAN  Completed   Zoster Vaccines- Shingrix  Completed   HPV VACCINES  Aged Out    Physical Exam: Vitals:   06/29/22 0903  BP: 132/66  Pulse: 62  Resp: 16  Temp: 97.6 F (36.4 C)  TempSrc: Temporal  SpO2: 96%  Weight: 140 lb (63.5 kg)  Height: 5\' 2"  (1.575 m)   Body mass index is 25.61 kg/m. Physical Exam Vitals reviewed.  Constitutional:      Appearance: Normal appearance.  HENT:     Head: Normocephalic.     Nose: Nose normal.     Mouth/Throat:     Mouth: Mucous membranes are moist.     Pharynx: Oropharynx is clear.  Eyes:     Pupils: Pupils are equal, round, and reactive to light.  Cardiovascular:     Rate and Rhythm: Normal rate and regular rhythm.     Pulses: Normal pulses.     Heart sounds: Normal heart sounds. No murmur heard. Pulmonary:     Effort: Pulmonary effort is normal.     Breath sounds: Normal breath sounds.  Abdominal:     General: Abdomen is flat. Bowel sounds are normal.     Palpations: Abdomen is soft.  Musculoskeletal:        General: No swelling.     Cervical back: Neck supple.  Skin:    General: Skin is warm.  Neurological:     General: No focal deficit present.     Mental Status: She is  alert and oriented to person, place, and time.     Comments: No nystagmus Patient did feel dizzy with position change but denied any vertigo.  Psychiatric:        Mood and Affect: Mood normal.        Thought Content: Thought content normal.     Labs reviewed: Basic Metabolic Panel: Recent Labs    04/12/22 1043 06/16/22 0800  NA 141 141  K 4.6 3.9  CL 102 106  CO2 27 25  GLUCOSE 93 102*  BUN 16 20  CREATININE 0.86 0.70  CALCIUM 10.2 8.8  MG 2.3  --   TSH  --  5.42*   Liver Function Tests: Recent Labs    04/12/22 1043 06/16/22 0800  AST 21 20  ALT 18 20  ALKPHOS 58  --   BILITOT 0.3 0.4  PROT 6.9 6.5  ALBUMIN 4.5  --    No results for input(s): "LIPASE", "AMYLASE" in the last 8760 hours. No results for input(s): "AMMONIA" in the last 8760 hours. CBC: Recent Labs    06/16/22 0800  WBC 4.5  NEUTROABS 2,916  HGB 12.9  HCT 37.9  MCV 95.0  PLT 277   Lipid Panel: Recent Labs    04/12/22 1043 06/16/22 0800  CHOL 218* 150  HDL 57 52  LDLCALC 131* 75  TRIG 170* 157*  CHOLHDL 3.8 2.9  LDLDIRECT 145*  --    No results found for: "HGBA1C"  Procedures since last visit: No results found.  Assessment/Plan 1. Dizziness Unclear etiology Differential diagnosis includes vertigo, orthostatic hypotension, vision issues I have made a referral to therapy to evaluate her Patient is agreeable with the plan If dizziness continues we will send her to follow-up with neurology and cardiology  2. Essential hypertension Blood pressure managed with cardiology  3. CAD  On statin therapy 4. Mild dementia,  Did have anxiety and depression  before but is much better now Taken off Lexapro and xanax Very highly functional Still driving On Namenda and Aricept  5. Pure hypercholesterolemia Statin and Zetia  6. Osteoporosis without current pathological fracture, unspecified osteoporosis type Just started back on Fosamax Managed with GYN      Labs/tests ordered:  *  No order type specified * Next appt:  08/17/2022

## 2022-07-06 ENCOUNTER — Encounter: Payer: Self-pay | Admitting: Neurology

## 2022-07-06 ENCOUNTER — Encounter: Payer: Self-pay | Admitting: Internal Medicine

## 2022-07-13 ENCOUNTER — Non-Acute Institutional Stay: Payer: Medicare Other | Admitting: Internal Medicine

## 2022-07-13 ENCOUNTER — Encounter: Payer: Self-pay | Admitting: Internal Medicine

## 2022-07-13 VITALS — BP 126/68 | HR 72 | Temp 97.8°F | Resp 17 | Ht 62.0 in | Wt 139.0 lb

## 2022-07-13 DIAGNOSIS — M810 Age-related osteoporosis without current pathological fracture: Secondary | ICD-10-CM

## 2022-07-13 DIAGNOSIS — I10 Essential (primary) hypertension: Secondary | ICD-10-CM

## 2022-07-13 DIAGNOSIS — R159 Full incontinence of feces: Secondary | ICD-10-CM | POA: Insufficient documentation

## 2022-07-13 DIAGNOSIS — F03A0 Unspecified dementia, mild, without behavioral disturbance, psychotic disturbance, mood disturbance, and anxiety: Secondary | ICD-10-CM | POA: Diagnosis not present

## 2022-07-13 DIAGNOSIS — R42 Dizziness and giddiness: Secondary | ICD-10-CM

## 2022-07-13 DIAGNOSIS — E7800 Pure hypercholesterolemia, unspecified: Secondary | ICD-10-CM

## 2022-07-13 DIAGNOSIS — K219 Gastro-esophageal reflux disease without esophagitis: Secondary | ICD-10-CM

## 2022-07-13 DIAGNOSIS — I2510 Atherosclerotic heart disease of native coronary artery without angina pectoris: Secondary | ICD-10-CM

## 2022-07-13 NOTE — Progress Notes (Signed)
[facility]: [facility] of Service:  Clinic ([id number])  Provider:   Code Status:  Goals of Care:     07/13/2022    8:59 AM  Advanced Directives  Does Patient Have a Medical Advance Directive? No  Type of Estate agent of [place];Living will;Out of facility DNR (pink MOST or yellow form)  Does patient want to make changes to medical advance directive? No - Patient declined  Copy of Healthcare Power of Attorney in Chart? No - copy requested     Chief Complaint  Patient presents with   Acute Visit    Patient is still having some dizziness but she is better. She states she went out of town and vomited on the way on her trip and on the way back.    HPI: Patient is a 82 y.o. female seen today for an acute visit for follow up of her dizziness Patient came for a follow-up of her dizziness She lives with her husband in [facility] [place]  Patient had been having the symptoms for past few months.  Denied any vertigo denied any syncope denied any weakness in her legs she just keeps saying that something wrong with her head. 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  Her therapist think her dizziness is related  due to her vision issue.  They did not see any signs of vertigo or orthostatic blood pressures. Patient was also started on Namenda and Aricept for her memory loss  Patient states that her dizziness is much better today and it is almost resolved.  She does not want to make any changes to her medications .  She did get her eyes checked by ophthalmologist and they said that they is no recent change.  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   Cataract    unsure of which eye   Colon  polyps    Environmental allergies    Essential hypertension 06/25/2019   Genetic testing 04/28/2017   Multi-Cancer panel (83 genes) @ [facility] - No pathogenic mutations detected   GERD (gastroesophageal reflux disease)    Glaucoma    High cholesterol    Hypertension    IBS (irritable bowel syndrome)    Memory change    Osteoporosis 06/2016   T score -2.8   PONV (postoperative nausea and vomiting)    Pure hypercholesterolemia 06/25/2019   Seizures    childhood, 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    Past Surgical History:  Procedure Laterality Date   ABDOMINAL HYSTERECTOMY     BLADDER SUSPENSION N/A 04/08/2016   Procedure: TRANSVAGINAL TAPE (TVT) PROCEDURE;  Surgeon: Genia Del, MD;  Location: [facility];  Service: Gynecology;  Laterality: N/A;   BREAST SURGERY     cyst removal   CARDIAC CATHETERIZATION     CATARACT EXTRACTION, BILATERAL     COLONOSCOPY     CYSTOSCOPY N/A 04/08/2016   Procedure: CYSTOSCOPY;  Surgeon: Genia Del, MD;  Location: [facility];  Service: Gynecology;  Laterality: N/A;   DENTAL SURGERY  03/31/2016   ELBOW FRACTURE SURGERY     EYE SURGERY Left    blood clot on retina   ROBOTIC ASSISTED TOTAL HYSTERECTOMY  WITH BILATERAL SALPINGO OOPHERECTOMY Bilateral 04/08/2016   Procedure: ROBOTIC ASSISTED TOTAL HYSTERECTOMY WITH BILATERAL SALPINGO OOPHORECTOMY/Uterosacral Ligament Suspension;  Surgeon: Genia Del, MD;  Location: [facility];  Service: Gynecology;  Laterality: Bilateral;  Request 4 hrs.   TUBAL LIGATION     WRIST ARTHROSCOPY      Allergies  Allergen Reactions   Codeine Other (See Comments)    Knocked her out Other reaction(s): Unknown   Meloxicam Other (See Comments)    BLURRY VISION, DIZZY, CONFUSION  Other reaction(s): Unknown   Sulfamethoxazole-Trimethoprim     Other reaction(s): Unknown   Tramadol Other (See Comments)    CONFUSION     Outpatient Encounter Medications as of 07/13/2022  Medication Sig   alendronate (FOSAMAX) 70 MG  tablet Take 1 tablet by mouth every 7 days. Take with a full glass of water on an empty stomach   ALPRAZolam (XANAX) 0.25 MG tablet 0.25 mg.   amLODipine (NORVASC) 5 MG tablet Take 1 tablet (5 mg total) by mouth daily.   donepezil (ARICEPT) 10 MG tablet Take 10 mg by mouth at bedtime.   ezetimibe (ZETIA) 10 MG tablet Take 1 tablet (10 mg total) by mouth daily.   hydrochlorothiazide (MICROZIDE) 12.5 MG capsule Take 1 capsule (12.5 mg total) by mouth daily.   memantine (NAMENDA) 10 MG tablet Take 1/2 tablet twice daily x 2 weeks, then take 1 tablet twice daily   Multiple Vitamin (MULTIVITAMIN WITH MINERALS) TABS tablet Take 1 tablet by mouth daily.   pravastatin (PRAVACHOL) 40 MG tablet Take 1 tablet (40 mg total) by mouth every evening.   timolol (TIMOPTIC) 0.25 % ophthalmic solution Place 1 drop into both eyes daily.   valsartan (DIOVAN) 320 MG tablet Take 1 tablet (320 mg total) by mouth daily. Please schedule appointment with Dr. Duke Salvia for refills.   fluocinonide cream (LIDEX) 0.05 % Apply 1 Application topically 2 (two) times daily.   No facility-administered encounter medications on file as of 07/13/2022.    Review of Systems:  Review of Systems  Constitutional:  Positive for fatigue. Negative for activity change and appetite change.  HENT: Negative.    Eyes:  Positive for visual disturbance.  Respiratory:  Negative for cough and shortness of breath.   Cardiovascular:  Negative for leg swelling.  Gastrointestinal:  Negative for constipation.  Genitourinary: Negative.   Musculoskeletal:  Negative for arthralgias, gait problem and myalgias.  Skin: Negative.   Neurological:  Negative for dizziness and weakness.  Psychiatric/Behavioral:  Negative for confusion, dysphoric mood and sleep disturbance.     Health Maintenance  Topic Date Due   COVID-19 Vaccine (5 - 2023-24 season) 07/15/2022 (Originally 02/11/2022)   Medicare Annual Wellness (AWV)  07/28/2022 (Originally 06/10/40)    INFLUENZA VACCINE  10/20/2022   DTaP/Tdap/Td (3 - Td or Tdap) 07/05/2029   Pneumonia Vaccine 77+ Years old  Completed   DEXA SCAN  Completed   Zoster Vaccines- Shingrix  Completed   HPV VACCINES  Aged Out    Physical Exam: Vitals:   07/13/22 0857  BP: 126/68  Pulse: 72  Resp: 17  Temp: 97.8 F (36.6 C)  TempSrc: Temporal  SpO2: 97%  Weight: 139 lb (63 kg)  Height:  (1.575 m)   Body mass index is 25.42 kg/m. Physical Exam Vitals reviewed.  Constitutional:      Appearance: Normal appearance.  HENT:     Head: Normocephalic.     Nose: Nose normal.     Mouth/Throat:     Mouth:  Mucous membranes are moist.     Pharynx: Oropharynx is clear.  Eyes:     Pupils: Pupils are equal, round, and reactive to light.  Cardiovascular:     Rate and Rhythm: Normal rate and regular rhythm.     Pulses: Normal pulses.     Heart sounds: Normal heart sounds. No murmur heard. Pulmonary:     Effort: Pulmonary effort is normal.     Breath sounds: Normal breath sounds.  Abdominal:     General: Abdomen is flat. Bowel sounds are normal.     Palpations: Abdomen is soft.  Musculoskeletal:        General: No swelling.     Cervical back: Neck supple.  Skin:    General: Skin is warm.  Neurological:     General: No focal deficit present.     Mental Status: She is alert and oriented to person, place, and time.  Psychiatric:        Mood and Affect: Mood normal.        Thought Content: Thought content normal.     Labs reviewed: Basic Metabolic Panel: Recent Labs    04/12/22 1043 06/16/22 0800  NA 141 141  K 4.6 3.9  CL 102 106  CO2 27 25  GLUCOSE 93 102*  BUN 16 20  CREATININE 0.86 0.70  CALCIUM 10.2 8.8  MG 2.3  --   TSH  --  5.42*   Liver Function Tests: Recent Labs    04/12/22 1043 06/16/22 0800  AST 21 20  ALT 18 20  ALKPHOS 58  --   BILITOT 0.3 0.4  PROT 6.9 6.5  ALBUMIN 4.5  --    No results for input(s): "LIPASE", "AMYLASE" in the last 8760 hours. No results  for input(s): "AMMONIA" in the last 8760 hours. CBC: Recent Labs    06/16/22 0800  WBC 4.5  NEUTROABS 2,916  HGB 12.9  HCT 37.9  MCV 95.0  PLT 277   Lipid Panel: Recent Labs    04/12/22 1043 06/16/22 0800  CHOL 218* 150  HDL 57 52  LDLCALC 131* 75  TRIG 170* 157*  CHOLHDL 3.8 2.9  LDLDIRECT 145*  --    No results found for: "HGBA1C"  Procedures since last visit: No results found.  Assessment/Plan 1. Dizziness Feeling better MRI has been negative in the past If symptoms come back will need follow up with Cardiology   2. Essential hypertension BP managed with help of Cardiology It seems to be doing well  3. CAD in native artery On statin therapy ? Not on aspirin   4. Mild dementia, unspecified dementia type, unspecified whether behavioral, psychotic, or mood disturbance or anxiety  Did have anxiety and depression before but is much better now Taken off Lexapro and xanax Very highly functional Still driving On Namenda and Aricpet 5. Pure hypercholesterolemia Statin and Zetia  6. Osteoporosis without current pathological fracture, unspecified osteoporosis type Just started back on Fosamax Managed with GYN       Labs/tests ordered:  * No order type specified * Next appt:  09/14/2022

## 2022-07-20 ENCOUNTER — Encounter: Payer: Medicare Other | Admitting: Internal Medicine

## 2022-08-12 ENCOUNTER — Other Ambulatory Visit: Payer: Self-pay | Admitting: Cardiovascular Disease

## 2022-08-17 ENCOUNTER — Encounter: Payer: Medicare Other | Admitting: Internal Medicine

## 2022-09-14 ENCOUNTER — Non-Acute Institutional Stay: Payer: Medicare Other | Admitting: Internal Medicine

## 2022-09-14 ENCOUNTER — Encounter: Payer: Self-pay | Admitting: Internal Medicine

## 2022-09-14 VITALS — BP 148/66 | HR 65 | Temp 97.7°F | Resp 16 | Ht 62.0 in | Wt 139.2 lb

## 2022-09-14 DIAGNOSIS — R42 Dizziness and giddiness: Secondary | ICD-10-CM | POA: Diagnosis not present

## 2022-09-14 DIAGNOSIS — W19XXXA Unspecified fall, initial encounter: Secondary | ICD-10-CM | POA: Diagnosis not present

## 2022-09-14 DIAGNOSIS — I2510 Atherosclerotic heart disease of native coronary artery without angina pectoris: Secondary | ICD-10-CM

## 2022-09-14 DIAGNOSIS — R21 Rash and other nonspecific skin eruption: Secondary | ICD-10-CM

## 2022-09-14 DIAGNOSIS — F03A0 Unspecified dementia, mild, without behavioral disturbance, psychotic disturbance, mood disturbance, and anxiety: Secondary | ICD-10-CM

## 2022-09-14 DIAGNOSIS — I10 Essential (primary) hypertension: Secondary | ICD-10-CM | POA: Diagnosis not present

## 2022-09-14 DIAGNOSIS — E7800 Pure hypercholesterolemia, unspecified: Secondary | ICD-10-CM

## 2022-09-14 MED ORDER — TRIAMCINOLONE ACETONIDE 0.5 % EX OINT: 1.0000 | TOPICAL_OINTMENT | Freq: Two times a day (BID) | CUTANEOUS | 0 refills | Status: DC

## 2022-09-14 NOTE — Progress Notes (Signed)
Location:  [facility] of Service:   Clinic  Provider:   Code Status:  Goals of Care:     09/14/2022    9:05 AM  Advanced Directives  Does Patient Have a Medical Advance Directive? Yes  Type of Estate agent of [place];Living will;Out of facility DNR (pink MOST or yellow form)  Does patient want to make changes to medical advance directive? No - Patient declined     Chief Complaint  Patient presents with   Medical Management of Chronic Issues    Patient is being seen for a 4 week follow up. Patient states she is feeling a little dizzy    Health Maintenance    Discussed the need for AWV and covid Vaccine    HPI: Patient is a 82 y.o. female seen today for medical management of chronic diseases.   Lives [place] in [facility] with her husband  Dizziness Continues to be her issue. No vertigo no syncope.   MRI done in September/23 showing cortical atrophy and microvascular ischemic changes  Not orthostatic .  She did work with therapy  the therapist thought that her dizziness is related due to her vision issues.  They did not see any signs of vertigo or orthostatic blood pressures.    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  Leading to poor depth perception. She usually walks holding her husband's hand She did have 1 fall when she was by herself.  It seems mechanical fall when she missed a step.  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   Cataract    unsure of which eye   Colon polyps    Environmental allergies    Essential hypertension 06/25/2019   Genetic testing 04/28/2017   Multi-Cancer panel (83 genes) @ [facility] - No  pathogenic mutations detected   GERD (gastroesophageal reflux disease)    Glaucoma    High cholesterol    Hypertension    IBS (irritable bowel syndrome)    Memory change    Osteoporosis 06/2016   T score -2.8   PONV (postoperative nausea and vomiting)    Pure hypercholesterolemia 06/25/2019   Seizures (HCC)    childhood, 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    Past Surgical History:  Procedure Laterality Date   ABDOMINAL HYSTERECTOMY     BLADDER SUSPENSION N/A 04/08/2016   Procedure: TRANSVAGINAL TAPE (TVT) PROCEDURE;  Surgeon: Genia Del, MD;  Location: [facility];  Service: Gynecology;  Laterality: N/A;   BREAST SURGERY     cyst removal   CARDIAC CATHETERIZATION     CATARACT EXTRACTION, BILATERAL     COLONOSCOPY     CYSTOSCOPY N/A 04/08/2016   Procedure: CYSTOSCOPY;  Surgeon: Genia Del, MD;  Location: [facility];  Service: Gynecology;  Laterality: N/A;   DENTAL SURGERY  03/31/2016   ELBOW FRACTURE SURGERY     EYE SURGERY Left    blood clot on retina   ROBOTIC ASSISTED TOTAL HYSTERECTOMY WITH BILATERAL SALPINGO OOPHERECTOMY Bilateral 04/08/2016   Procedure: ROBOTIC ASSISTED TOTAL HYSTERECTOMY WITH BILATERAL SALPINGO OOPHORECTOMY/Uterosacral Ligament Suspension;  Surgeon: Genia Del, MD;  Location: [facility];  Service: Gynecology;  Laterality: Bilateral;  Request 4 hrs.   TUBAL LIGATION     WRIST ARTHROSCOPY      Allergies  Allergen Reactions   Codeine Other (See Comments)    Knocked her out Other reaction(s): Unknown   Meloxicam Other (See Comments)    BLURRY VISION, DIZZY, CONFUSION  Other reaction(s): Unknown   Sulfamethoxazole-Trimethoprim     Other reaction(s): Unknown   Tramadol Other (See Comments)    CONFUSION     Outpatient Encounter Medications as of 09/14/2022  Medication Sig   alendronate (FOSAMAX) 70 MG tablet Take 1 tablet by mouth every 7 days. Take with a full glass of water on an empty stomach   ALPRAZolam (XANAX) 0.25 MG tablet 0.25 mg  daily as needed.   amLODipine (NORVASC) 5 MG tablet Take 1 tablet (5 mg total) by mouth daily.   donepezil (ARICEPT) 10 MG tablet Take 10 mg by mouth at bedtime.   ezetimibe (ZETIA) 10 MG tablet Take 1 tablet (10 mg total) by mouth daily.   fluocinonide cream (LIDEX) 0.05 % Apply 1 Application topically 2 (two) times daily.   hydrochlorothiazide (MICROZIDE) 12.5 MG capsule Take 1 capsule (12.5 mg total) by mouth daily.   memantine (NAMENDA) 10 MG tablet Take 1/2 tablet twice daily x 2 weeks, then take 1 tablet twice daily   Multiple Vitamin (MULTIVITAMIN WITH MINERALS) TABS tablet Take 1 tablet by mouth daily.   pravastatin (PRAVACHOL) 40 MG tablet Take 1 tablet (40 mg total) by mouth every evening.   timolol (TIMOPTIC) 0.25 % ophthalmic solution Place 1 drop into both eyes daily.   valsartan (DIOVAN) 320 MG tablet Take 1 tablet (320 mg total) by mouth daily. Please schedule appointment with Dr. Duke Salvia for refills.   No facility-administered encounter medications on file as of 09/14/2022.    Review of Systems:  Review of Systems  Constitutional:  Negative for activity change and appetite change.  HENT: Negative.    Eyes:  Positive for visual disturbance.  Respiratory:  Negative for cough and shortness of breath.   Cardiovascular:  Negative for leg swelling.  Gastrointestinal:  Negative for constipation.  Genitourinary: Negative.   Musculoskeletal:  Positive for gait problem. Negative for arthralgias and myalgias.  Skin: Negative.   Neurological:  Positive for dizziness. Negative for weakness.  Psychiatric/Behavioral:  Positive for confusion. Negative for dysphoric mood and sleep disturbance.     Health Maintenance  Topic Date Due   Medicare Annual Wellness (AWV)  Never done   COVID-19 Vaccine (5 - 2023-24 season) 02/11/2022   INFLUENZA VACCINE  10/20/2022   DTaP/Tdap/Td (3 - Td or Tdap) 07/05/2029   Pneumonia Vaccine 26+ Years old  Completed   DEXA SCAN  Completed   Zoster  Vaccines- Shingrix  Completed   HPV VACCINES  Aged Out    Physical Exam: Vitals:   09/14/22 0906 09/14/22 0908  BP: (!) 152/68 (!) 148/66  Pulse: 65   Resp: 16   Temp: 97.7 F (36.5 C)   TempSrc: Temporal   SpO2: 96%   Weight: 139 lb 3.2 oz (63.1 kg)   Height: 5\' 2"  (1.575 m)    Body mass index is 25.46 kg/m. Physical Exam Vitals reviewed.  Constitutional:      Appearance: Normal appearance.  HENT:     Head: Normocephalic.     Nose: Nose normal.     Mouth/Throat:     Mouth: Mucous membranes are moist.     Pharynx: Oropharynx is clear.  Eyes:  Pupils: Pupils are equal, round, and reactive to light.  Cardiovascular:     Rate and Rhythm: Normal rate and regular rhythm.     Pulses: Normal pulses.     Heart sounds: Normal heart sounds. No murmur heard. Pulmonary:     Effort: Pulmonary effort is normal.     Breath sounds: Normal breath sounds.  Abdominal:     General: Abdomen is flat. Bowel sounds are normal.     Palpations: Abdomen is soft.  Musculoskeletal:        General: No swelling.     Cervical back: Neck supple.  Skin:    General: Skin is warm.     Comments: Macular rash  Neurological:     General: No focal deficit present.     Mental Status: She is alert and oriented to person, place, and time.  Psychiatric:        Mood and Affect: Mood normal.        Thought Content: Thought content normal.     Labs reviewed: Basic Metabolic Panel: Recent Labs    04/12/22 1043 06/16/22 0800  NA 141 141  K 4.6 3.9  CL 102 106  CO2 27 25  GLUCOSE 93 102*  BUN 16 20  CREATININE 0.86 0.70  CALCIUM 10.2 8.8  MG 2.3  --   TSH  --  5.42*   Liver Function Tests: Recent Labs    04/12/22 1043 06/16/22 0800  AST 21 20  ALT 18 20  ALKPHOS 58  --   BILITOT 0.3 0.4  PROT 6.9 6.5  ALBUMIN 4.5  --    No results for input(s): "LIPASE", "AMYLASE" in the last 8760 hours. No results for input(s): "AMMONIA" in the last 8760 hours. CBC: Recent Labs     06/16/22 0800  WBC 4.5  NEUTROABS 2,916  HGB 12.9  HCT 37.9  MCV 95.0  PLT 277   Lipid Panel: Recent Labs    04/12/22 1043 06/16/22 0800  CHOL 218* 150  HDL 57 52  LDLCALC 131* 75  TRIG 170* 157*  CHOLHDL 3.8 2.9  LDLDIRECT 145*  --    No results found for: "HGBA1C"  Procedures since last visit: No results found.  Assessment/Plan 1. Dizziness MRI negative in 09/23 Worked with therapy No vertigo or orthostatic ? Due to her vision She has upcoming appointment with Cardiology and Neuro to review this  2. Fall, initial encounter No Injuries Told to use Cane when by herself due to her poor vision  inher Left EYe  3. Essential hypertension On Amlodipine, hydrochlorothiazide and Diovan  4. CAD in native artery ? Not on Aspirin Discuss with Cardiology On statin and zetia  5. Mild dementia, unspecified dementia type, unspecified whether behavioral, psychotic, or mood disturbance or anxiety (HCC) On Namenda and Aricept Husband manages her meds She is independent with her ADLS Still drives 6. Pure hypercholesterolemia On Statin  7. Rash Triamcinolone prn 8 Anxiety Uses xanax very rarely Will leave it in her list 9 Osteoporosis without current pathological fracture, unspecified osteoporosis type Just started back on Fosamax Managed with GYN  Labs/tests ordered:   Next appt:  Visit date not found

## 2022-09-20 ENCOUNTER — Ambulatory Visit (INDEPENDENT_AMBULATORY_CARE_PROVIDER_SITE_OTHER): Payer: Medicare Other | Admitting: Neurology

## 2022-09-20 ENCOUNTER — Encounter: Payer: Self-pay | Admitting: Neurology

## 2022-09-20 VITALS — BP 132/81 | HR 75 | Ht 62.0 in | Wt 137.5 lb

## 2022-09-20 DIAGNOSIS — F03B0 Unspecified dementia, moderate, without behavioral disturbance, psychotic disturbance, mood disturbance, and anxiety: Secondary | ICD-10-CM

## 2022-09-20 DIAGNOSIS — R413 Other amnesia: Secondary | ICD-10-CM

## 2022-09-20 DIAGNOSIS — F0390 Unspecified dementia without behavioral disturbance: Secondary | ICD-10-CM | POA: Insufficient documentation

## 2022-09-20 NOTE — Progress Notes (Signed)
Patient: Sandra Wheeler Date of Birth: [birth year]  Reason for Visit: Follow up History from: Patient, husband Primary Neurologist: [place]  ASSESSMENT AND PLAN 82 y.o. 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  Unclear if Namenda is the culprit, but nonetheless has noted zero benefit in her memory with Namenda -Continue Aricept 10 mg daily -Recommend against driving given MOCA score, specifically difficulty with visual-spatial function  -I will again place referral for formal neuropsychological testing  to help with categorizing memory deficits with underlying anxiety , help her and her husband plan for the future -MRI of the brain showed mild generalized cortical atrophy, a little more than normal for age -Encouraged exercise, healthy eating, brain stimulating activities, good management of vascular risk factors -Follow-up in 6 months or sooner if needed  HISTORY OF PRESENT ILLNESS: Today 09/20/22 MOCA 18/30. Remains on [facility] Independent Living. More trouble spelling words. Remains on Aricept 10 mg daily. We started Namenda 10 mg twice daily in Dec 2023. Has had some dizziness, wonders if related? If gets up fast noted. When walking, has to tell legs to move. Fell 1 month ago, her foot got hung on curb. 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  I reviewed PCP note Dr. Chales Abrahams 09/14/22, not orthostatic,, therapy felt dizziness was related to vision.  Husband has noted decline in memory.  She remains independent in ADLs.  Update 02/23/22 SS: Sandra Wheeler is here today for follow-up.  MRI of the brain September 2023 showed mild generalized cortical atrophy, a little more than normal for age.  She was started on Aricept 10 mg. MOCA was 14/30. Today, she feels things are significantly better since starting Aricept, has helped a lot with word  findings, MOCA 18/30. She is taking Aricept 10 mg in AM. Doesn't think any side effects. She drives without any issues, not getting lost. They lives at [facility] independent living. She does mention stress, not for any particular reason, is on Lexapro, takes Xanax at night if needed. 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   HISTORY  11/17/21 Dr. Delena Wheeler: The patient presents for evaluation of memory loss over the past year. It has been getting worse over time. She has noticed issues primarily with short term memory and word finding difficulty. Forgets how to work things around the house, for example with not remember how to turn the television on. It also takes her longer to do things on the computer. Has trouble figuring out what time it is, or what time she needs to be places. Has gone to places an hour early twice this week. Forgets acquaintances and friends names, has not forgotten family members names. She remains socially active and is the head of a committee, which is somewhat stressful for her.    She is afraid of falling. 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 [name] and sprained her ankle 2 years ago. Also fell about 3 years ago and hit her head. She does yoga 2-3 times per week.    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 Hit her head on the door and thinks she may have a had a concussion. Lost her sense of smell and it never returned. Did not  seek evaluation. 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 She talks in her sleep and snores a little. Typically does not act out her dreams. Mood: History of anxiety, takes Lexapro but is not sure if it helps   Functional status:  Patient lives with her husband Cooking: no issues, does not cook much Driving: drives a little bit, has not gotten lost  but will sometimes forget where to turn next Bills: does not manage the finances, but does not think she would be able to Medications: no issues, she would like to simplify her medications Ever left the stove on by accident?: no Forget how to use items around the house?: yes, television Forgetting loved ones names?: no Word finding difficulty? yes   OTHER MEDICAL CONDITIONS: CAD, HTN, HLD, anxiety  REVIEW OF SYSTEMS: Out of a complete 14 system review of symptoms, the patient complains only of the following symptoms, and all other reviewed systems are negative.  See HPI  ALLERGIES: Allergies  Allergen Reactions   Codeine Other (See Comments)    Knocked her out Other reaction(s): Unknown   Meloxicam Other (See Comments)    BLURRY VISION, DIZZY, CONFUSION  Other reaction(s): Unknown   Sulfamethoxazole-Trimethoprim     Other reaction(s): Unknown   Tramadol Other (See Comments)    CONFUSION     HOME MEDICATIONS: Outpatient Medications Prior to Visit  Medication Sig Dispense Refill   alendronate (FOSAMAX) 70 MG tablet Take 1 tablet by mouth every 7 days. Take with a full glass of water on an empty stomach 12 tablet 0   amLODipine (NORVASC) 5 MG tablet Take 1 tablet (5 mg total) by mouth daily. 90 tablet 3   donepezil (ARICEPT) 10 MG tablet Take 10 mg by mouth at bedtime.     ezetimibe (ZETIA) 10 MG tablet Take 1 tablet (10 mg total) by mouth daily. 90 tablet 3   fenoprofen (NALFON) 600 MG TABS tablet Take 600 mg by mouth daily.     hydrochlorothiazide (MICROZIDE) 12.5 MG capsule Take 1 capsule (12.5 mg total) by mouth daily. 90 capsule 3   memantine (NAMENDA) 10 MG tablet Take 1/2 tablet twice daily x 2 weeks, then take 1 tablet twice daily 180 tablet 3   Multiple Vitamin (MULTIVITAMIN WITH MINERALS) TABS tablet Take 1 tablet by mouth daily.     pravastatin (PRAVACHOL) 40 MG tablet Take 1 tablet (40 mg total) by mouth every evening. 90 tablet 3   timolol (TIMOPTIC) 0.25 %  ophthalmic solution Place 1 drop into both eyes daily.     valsartan (DIOVAN) 320 MG tablet Take 1 tablet (320 mg total) by mouth daily. Please schedule appointment with Dr. Duke Salvia for refills. 90 tablet 3   ALPRAZolam (XANAX) 0.25 MG tablet 0.25 mg daily as needed.     triamcinolone ointment (KENALOG) 0.5 % Apply 1 Application topically 2 (two) times daily. (Patient not taking: Reported on 09/20/2022) 30 g 0   No facility-administered medications prior to visit.    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/25/2019   Cataract    unsure of which eye   Colon polyps    Environmental allergies    Essential hypertension 06/25/2019   Genetic testing 04/28/2017  Multi-Cancer panel (83 genes) @ [facility] - No pathogenic mutations detected   GERD (gastroesophageal reflux disease)    Glaucoma    High cholesterol    Hypertension    IBS (irritable bowel syndrome)    Memory change    Osteoporosis 06/2016   T score -2.8   PONV (postoperative nausea and vomiting)    Pure hypercholesterolemia 06/25/2019   Seizures (HCC)    childhood, 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    PAST SURGICAL HISTORY: Past Surgical History:  Procedure Laterality Date   ABDOMINAL HYSTERECTOMY     BLADDER SUSPENSION N/A 04/08/2016   Procedure: TRANSVAGINAL TAPE (TVT) PROCEDURE;  Surgeon: Genia Del, MD;  Location: [facility];  Service: Gynecology;  Laterality: N/A;   BREAST SURGERY     cyst removal   CARDIAC CATHETERIZATION     CATARACT EXTRACTION, BILATERAL     COLONOSCOPY     CYSTOSCOPY N/A 04/08/2016   Procedure: CYSTOSCOPY;  Surgeon: Genia Del, MD;  Location: [facility];  Service: Gynecology;  Laterality: N/A;   DENTAL SURGERY  03/31/2016   ELBOW FRACTURE SURGERY     EYE SURGERY Left    blood clot on retina   ROBOTIC ASSISTED TOTAL HYSTERECTOMY WITH BILATERAL SALPINGO OOPHERECTOMY  Bilateral 04/08/2016   Procedure: ROBOTIC ASSISTED TOTAL HYSTERECTOMY WITH BILATERAL SALPINGO OOPHORECTOMY/Uterosacral Ligament Suspension;  Surgeon: Genia Del, MD;  Location: [facility];  Service: Gynecology;  Laterality: Bilateral;  Request 4 hrs.   TUBAL LIGATION     WRIST ARTHROSCOPY      FAMILY HISTORY: Family History  Problem Relation Age of Onset   Macular degeneration Mother    Hearing loss Mother    CAD Mother    Diabetes Father        dx in his 44s-60s   Heart disease Father    Rheumatic fever Father    Pancreatic cancer Brother 85       non-smoker; deceased 54   Cancer Maternal Grandmother        abdominal; unsure of primary; deceased 59   Heart disease Maternal Grandfather    Congestive Heart Failure Paternal Grandfather    Other Brother        at birth   Cancer Other        MGMs sister, abdominal; unsure of primary; deceased 92s   Pancreatic cancer Maternal Uncle 103       non-smoker; deceased 62   Alcohol abuse Maternal Uncle    Dementia Paternal Aunt    Congestive Heart Failure Paternal Uncle    Leukemia Cousin        female; daughter of mat uncle with pancreatic ca; deceased 64    SOCIAL HISTORY: Social History   Socioeconomic History   Marital status: Married    Spouse name: richard   Number of children: 1   Years of education: Not on file   Highest education level: Not on file  Occupational History   Not on file  Tobacco Use   Smoking status: Former    Types: Cigarettes    Quit date: 03/21/1969    Years since quitting: 53.5   Smokeless tobacco: Never  Vaping Use   Vaping Use: Never used  Substance and Sexual Activity   Alcohol use: Yes    Alcohol/week: 1.0 standard drink of alcohol    Types: 1 Glasses of wine per week    Comment: 7 a week   Drug use: No   Sexual activity: Not Currently    Birth control/protection: Surgical  Comment: First intercouse at age 37. Only one partner.  Other Topics Concern   Not on file  Social History  Narrative   11/17/21 lives with husband   Coffee 1-2 daily   Social Determinants of Health   Financial Resource Strain: Not on file  Food Insecurity: Not on file  Transportation Needs: Not on file  Physical Activity: Not on file  Stress: Not on file  Social Connections: Not on file  Intimate Partner Violence: Not on file   PHYSICAL EXAM  Vitals:   09/20/22 1508  BP: 132/81  Pulse: 75  Weight: 137 lb 8 oz (62.4 kg)  Height: 5\' 2"  (1.575 m)    Body mass index is 25.15 kg/m.    09/20/2022    3:18 PM 02/23/2022    3:01 PM 11/17/2021    9:37 AM  Montreal Cognitive Assessment   Visuospatial/ Executive (0/5) 3 0 1  Naming (0/3) 2 3 2   Attention: Read list of digits (0/2) 1 2 2   Attention: Read list of letters (0/1) 1 1 0  Attention: Serial 7 subtraction starting at 100 (0/3) 1 1 0  Language: Repeat phrase (0/2) 1 2 0  Language : Fluency (0/1) 1 0 0  Abstraction (0/2) 2 2 2   Delayed Recall (0/5) 0 1 2  Orientation (0/6) 6 6 5   Total 18 18 14   Adjusted Score (based on education) 18 18     Generalized: Well developed, in no acute distress  Neurological examination  Mentation: Alert oriented to time, place, history taking. Follows all commands speech and language fluent Cranial nerve II-XII: Pupils were equal round reactive to light. Extraocular movements were full, visual field were full on confrontational test. Facial sensation and strength were normal.  Head turning and shoulder shrug  were normal and symmetric. Motor: The motor testing reveals 5 over 5 strength of all 4 extremities. Good symmetric motor tone is noted throughout.  Sensory: Sensory testing is intact to soft touch on all 4 extremities. No evidence of extinction is noted.  Coordination: Cerebellar testing reveals good finger-nose-finger and heel-to-shin bilaterally.  Gait and station: Gait is normal.  Reflexes: Deep tendon reflexes are symmetric and normal bilaterally.   DIAGNOSTIC DATA (LABS, IMAGING,  TESTING) - I reviewed patient records, labs, notes, testing and imaging myself where available.  Lab Results  Component Value Date   WBC 4.5 06/16/2022   HGB 12.9 06/16/2022   HCT 37.9 06/16/2022   MCV 95.0 06/16/2022   PLT 277 06/16/2022      Component Value Date/Time   NA 141 06/16/2022 0800   NA 141 04/12/2022 1043   K 3.9 06/16/2022 0800   CL 106 06/16/2022 0800   CO2 25 06/16/2022 0800   GLUCOSE 102 (H) 06/16/2022 0800   BUN 20 06/16/2022 0800   BUN 16 04/12/2022 1043   CREATININE 0.70 06/16/2022 0800   CALCIUM 8.8 06/16/2022 0800   PROT 6.5 06/16/2022 0800   PROT 6.9 04/12/2022 1043   ALBUMIN 4.5 04/12/2022 1043   AST 20 06/16/2022 0800   ALT 20 06/16/2022 0800   ALKPHOS 58 04/12/2022 1043   BILITOT 0.4 06/16/2022 0800   BILITOT 0.3 04/12/2022 1043   GFRNONAA 72 08/08/2019 0817   GFRAA 83 08/08/2019 0817   Lab Results  Component Value Date   CHOL 150 06/16/2022   HDL 52 06/16/2022   LDLCALC 75 06/16/2022   LDLDIRECT 145 (H) 04/12/2022   TRIG 157 (H) 06/16/2022   CHOLHDL 2.9 06/16/2022  No results found for: "HGBA1C" No results found for: "VITAMINB12" Lab Results  Component Value Date   TSH 5.42 (H) 06/16/2022    Margie Ege, AGNP-C, DNP 09/20/2022, 3:31 PM [facility] [address], [address] [place], [place] [address] [phone]

## 2022-09-20 NOTE — Patient Instructions (Addendum)
Reduce Namenda to 1/2 tablet twice daily x 2 weeks, then stop it, monitor the memory. Continue the Aricept. Recommend against driving. Follow up in 6 months   Orders Placed This Encounter  Procedures   Ambulatory referral to Neuropsychology    Referral Priority:   Routine    Referral Type:   Psychiatric    Referral Reason:   Specialty Services Required    Requested Specialty:   Psychology    Number of Visits Requested:   1

## 2022-09-28 ENCOUNTER — Encounter: Payer: Self-pay | Admitting: Psychology

## 2022-11-03 ENCOUNTER — Observation Stay (HOSPITAL_COMMUNITY)
Admission: EM | Admit: 2022-11-03 | Discharge: 2022-11-04 | Disposition: A | Discharge status: Home / Self Care | Payer: Medicare Other | Source: Home / Self Care | Attending: Emergency Medicine | Admitting: Emergency Medicine

## 2022-11-03 ENCOUNTER — Encounter (HOSPITAL_COMMUNITY): Payer: Self-pay

## 2022-11-03 ENCOUNTER — Emergency Department (HOSPITAL_COMMUNITY): Payer: Medicare Other

## 2022-11-03 ENCOUNTER — Other Ambulatory Visit: Payer: Self-pay

## 2022-11-03 DIAGNOSIS — I10 Essential (primary) hypertension: Secondary | ICD-10-CM | POA: Diagnosis not present

## 2022-11-03 DIAGNOSIS — R55 Syncope and collapse: Secondary | ICD-10-CM | POA: Diagnosis not present

## 2022-11-03 DIAGNOSIS — R001 Bradycardia, unspecified: Secondary | ICD-10-CM | POA: Diagnosis not present

## 2022-11-03 DIAGNOSIS — R252 Cramp and spasm: Secondary | ICD-10-CM

## 2022-11-03 DIAGNOSIS — K4090 Unilateral inguinal hernia, without obstruction or gangrene, not specified as recurrent: Secondary | ICD-10-CM | POA: Diagnosis not present

## 2022-11-03 DIAGNOSIS — Z79899 Other long term (current) drug therapy: Secondary | ICD-10-CM | POA: Diagnosis not present

## 2022-11-03 DIAGNOSIS — G96191 Perineural cyst: Secondary | ICD-10-CM | POA: Insufficient documentation

## 2022-11-03 DIAGNOSIS — I25118 Atherosclerotic heart disease of native coronary artery with other forms of angina pectoris: Secondary | ICD-10-CM

## 2022-11-03 DIAGNOSIS — K5730 Diverticulosis of large intestine without perforation or abscess without bleeding: Secondary | ICD-10-CM | POA: Insufficient documentation

## 2022-11-03 DIAGNOSIS — R42 Dizziness and giddiness: Secondary | ICD-10-CM | POA: Diagnosis not present

## 2022-11-03 DIAGNOSIS — Z9181 History of falling: Secondary | ICD-10-CM | POA: Diagnosis not present

## 2022-11-03 DIAGNOSIS — E785 Hyperlipidemia, unspecified: Secondary | ICD-10-CM

## 2022-11-03 DIAGNOSIS — F03A0 Unspecified dementia, mild, without behavioral disturbance, psychotic disturbance, mood disturbance, and anxiety: Secondary | ICD-10-CM | POA: Diagnosis not present

## 2022-11-03 DIAGNOSIS — E7800 Pure hypercholesterolemia, unspecified: Secondary | ICD-10-CM | POA: Diagnosis present

## 2022-11-03 DIAGNOSIS — M533 Sacrococcygeal disorders, not elsewhere classified: Secondary | ICD-10-CM | POA: Diagnosis not present

## 2022-11-03 DIAGNOSIS — F0390 Unspecified dementia without behavioral disturbance: Secondary | ICD-10-CM | POA: Insufficient documentation

## 2022-11-03 DIAGNOSIS — I2510 Atherosclerotic heart disease of native coronary artery without angina pectoris: Secondary | ICD-10-CM | POA: Diagnosis not present

## 2022-11-03 DIAGNOSIS — Z87891 Personal history of nicotine dependence: Secondary | ICD-10-CM | POA: Diagnosis not present

## 2022-11-03 DIAGNOSIS — M8588 Other specified disorders of bone density and structure, other site: Secondary | ICD-10-CM | POA: Insufficient documentation

## 2022-11-03 LAB — COMPREHENSIVE METABOLIC PANEL
ALT: 15 U/L (ref 0–44)
AST: 18 U/L (ref 15–41)
Albumin: 3.3 g/dL — ABNORMAL LOW (ref 3.5–5.0)
Alkaline Phosphatase: 33 U/L — ABNORMAL LOW (ref 38–126)
Anion gap: 8 (ref 5–15)
BUN: 17 mg/dL (ref 8–23)
CO2: 22 mmol/L (ref 22–32)
Calcium: 8.3 mg/dL — ABNORMAL LOW (ref 8.9–10.3)
Chloride: 111 mmol/L (ref 98–111)
Creatinine, Ser: 0.68 mg/dL (ref 0.44–1.00)
GFR, Estimated: >60 mL/min (ref >60)
Glucose, Bld: 100 mg/dL — ABNORMAL HIGH (ref 70–99)
Potassium: 3.4 mmol/L — ABNORMAL LOW (ref 3.5–5.1)
Sodium: 141 mmol/L (ref 135–145)
Total Bilirubin: 0.5 mg/dL (ref 0.3–1.2)
Total Protein: 6 g/dL — ABNORMAL LOW (ref 6.5–8.1)

## 2022-11-03 LAB — URINALYSIS, ROUTINE W REFLEX MICROSCOPIC
Bilirubin Urine: NEGATIVE
Glucose, UA: NEGATIVE mg/dL
Hgb urine dipstick: NEGATIVE
Ketones, ur: NEGATIVE mg/dL
Nitrite: NEGATIVE
Protein, ur: NEGATIVE mg/dL
Specific Gravity, Urine: 1.01 (ref 1.005–1.030)
pH: 8 (ref 5.0–8.0)

## 2022-11-03 LAB — CBC WITH DIFFERENTIAL/PLATELET
Abs Immature Granulocytes: 0.02 10*3/uL (ref 0.00–0.07)
Basophils Absolute: 0 10*3/uL (ref 0.0–0.1)
Basophils Relative: 1 %
Eosinophils Absolute: 0.1 10*3/uL (ref 0.0–0.5)
Eosinophils Relative: 2 %
HCT: 37 % (ref 36.0–46.0)
Hemoglobin: 12.5 g/dL (ref 12.0–15.0)
Immature Granulocytes: 0 %
Lymphocytes Relative: 22 %
Lymphs Abs: 1.1 10*3/uL (ref 0.7–4.0)
MCH: 32.3 pg (ref 26.0–34.0)
MCHC: 33.8 g/dL (ref 30.0–36.0)
MCV: 95.6 fL (ref 80.0–100.0)
Monocytes Absolute: 0.4 10*3/uL (ref 0.1–1.0)
Monocytes Relative: 8 %
Neutro Abs: 3.4 10*3/uL (ref 1.7–7.7)
Neutrophils Relative %: 67 %
Platelets: 249 10*3/uL (ref 150–400)
RBC: 3.87 MIL/uL (ref 3.87–5.11)
RDW: 11.5 % (ref 11.5–15.5)
WBC: 5 10*3/uL (ref 4.0–10.5)
nRBC: 0 % (ref 0.0–0.2)

## 2022-11-03 LAB — D-DIMER, QUANTITATIVE: D-Dimer, Quant: 0.43 ug{FEU}/mL (ref 0.00–0.50)

## 2022-11-03 LAB — TROPONIN I (HIGH SENSITIVITY)
Troponin I (High Sensitivity): 4 ng/L (ref <18)
Troponin I (High Sensitivity): 3 ng/L (ref <18)

## 2022-11-03 LAB — MAGNESIUM: Magnesium: 2 mg/dL (ref 1.7–2.4)

## 2022-11-03 LAB — TSH: TSH: 3.349 u[IU]/mL (ref 0.350–4.500)

## 2022-11-03 MED ORDER — EZETIMIBE 10 MG PO TABS: 10.0000 mg | ORAL_TABLET | Freq: Every day | ORAL | Status: DC

## 2022-11-03 MED ORDER — ENOXAPARIN SODIUM 40 MG/0.4ML IJ SOSY
40.0000 mg | PREFILLED_SYRINGE | Freq: Every day | INTRAMUSCULAR | Status: DC
  Administered 2022-11-03: 40 mg via SUBCUTANEOUS
  Filled 2022-11-03: qty 0.4

## 2022-11-03 MED ORDER — ADULT MULTIVITAMIN W/MINERALS CH
1.0000 | ORAL_TABLET | Freq: Every day | ORAL | Status: DC
  Administered 2022-11-04: 1 via ORAL
  Filled 2022-11-03: qty 1

## 2022-11-03 MED ORDER — ONDANSETRON HCL 4 MG PO TABS: 4.0000 mg | ORAL_TABLET | Freq: Four times a day (QID) | ORAL | Status: DC | PRN

## 2022-11-03 MED ORDER — IRBESARTAN 150 MG PO TABS
300.0000 mg | ORAL_TABLET | Freq: Every day | ORAL | Status: DC
  Administered 2022-11-04: 300 mg via ORAL
  Filled 2022-11-03: qty 2

## 2022-11-03 MED ORDER — LACTATED RINGERS IV BOLUS
500.0000 mL | Freq: Once | INTRAVENOUS | Status: AC
  Administered 2022-11-03: 500 mL via INTRAVENOUS

## 2022-11-03 MED ORDER — ALBUTEROL SULFATE (2.5 MG/3ML) 0.083% IN NEBU: 2.5000 mg | INHALATION_SOLUTION | RESPIRATORY_TRACT | Status: DC | PRN

## 2022-11-03 MED ORDER — PRAVASTATIN SODIUM 40 MG PO TABS
40.0000 mg | ORAL_TABLET | Freq: Every evening | ORAL | Status: DC
  Administered 2022-11-03: 40 mg via ORAL
  Filled 2022-11-03: qty 1

## 2022-11-03 MED ORDER — DONEPEZIL HCL 10 MG PO TABS
10.0000 mg | ORAL_TABLET | Freq: Every day | ORAL | Status: DC
  Administered 2022-11-03: 10 mg via ORAL
  Filled 2022-11-03: qty 1

## 2022-11-03 MED ORDER — ACETAMINOPHEN 650 MG RE SUPP: 650.0000 mg | Freq: Four times a day (QID) | RECTAL | Status: DC | PRN

## 2022-11-03 MED ORDER — AMLODIPINE BESYLATE 5 MG PO TABS: 5.0000 mg | ORAL_TABLET | Freq: Every day | ORAL | Status: DC

## 2022-11-03 MED ORDER — HYDROCHLOROTHIAZIDE 12.5 MG PO TABS
12.5000 mg | ORAL_TABLET | Freq: Every day | ORAL | Status: DC
  Administered 2022-11-03: 12.5 mg via ORAL
  Filled 2022-11-03 (×2): qty 1

## 2022-11-03 MED ORDER — TRAZODONE HCL 50 MG PO TABS: 25.0000 mg | ORAL_TABLET | Freq: Every evening | ORAL | Status: DC | PRN

## 2022-11-03 MED ORDER — TIMOLOL MALEATE 0.25 % OP SOLN
1.0000 [drp] | Freq: Every day | OPHTHALMIC | Status: DC
  Administered 2022-11-03: 1 [drp] via OPHTHALMIC
  Filled 2022-11-03: qty 5

## 2022-11-03 MED ORDER — ONDANSETRON HCL 4 MG/2ML IJ SOLN: 4.0000 mg | Freq: Four times a day (QID) | INTRAMUSCULAR | Status: DC | PRN

## 2022-11-03 MED ORDER — IRBESARTAN 150 MG PO TABS
300.0000 mg | ORAL_TABLET | Freq: Every day | ORAL | Status: DC
Start: 2022-11-03 — End: 2022-11-03

## 2022-11-03 MED ORDER — DIPHENHYDRAMINE HCL 25 MG PO CAPS
50.0000 mg | ORAL_CAPSULE | Freq: Every evening | ORAL | Status: DC | PRN
  Administered 2022-11-03: 50 mg via ORAL
  Filled 2022-11-03: qty 2

## 2022-11-03 MED ORDER — ACETAMINOPHEN 325 MG PO TABS
650.0000 mg | ORAL_TABLET | Freq: Four times a day (QID) | ORAL | Status: DC | PRN
  Administered 2022-11-03: 650 mg via ORAL
  Filled 2022-11-03 (×2): qty 2

## 2022-11-03 MED ORDER — POTASSIUM CHLORIDE CRYS ER 20 MEQ PO TBCR
40.0000 meq | EXTENDED_RELEASE_TABLET | Freq: Once | ORAL | Status: AC
  Administered 2022-11-03: 40 meq via ORAL
  Filled 2022-11-03: qty 2

## 2022-11-03 NOTE — ED Triage Notes (Signed)
Patient woke up this morning, felt shaky and sweaty. Patient stated she went to go get something to eat from the kitchen and fell backwards. Patient denies hitting head, denies taking blood thinners, states "I am not sure if I remember falling or not I just remember being down."   EMS: HR mid 40s, BS 139, bp 150/80, RR 18, SpO2 100% on RA.

## 2022-11-03 NOTE — H&P (Signed)
History and Physical  Sandra Wheeler ZOX:[id number] DOB: [birth year] DOA: 11/03/2022  PCP: Mahlon Gammon, MD   Chief Complaint: syncope   HPI: Sandra Wheeler is a 82 y.o. female with medical history significant for hypertension, IBS, mild dementia, GERD who is being admitted to the hospital with syncope.  History is provided by the patient as well as her husband who is at the bedside, they tell me that for the last couple of months she has been having intermittently dizziness.  Denies any chest pain, fevers, chills, nausea, vomiting or any other illness.  Says that she has been intermittently dizzy, not on a daily basis, but usually when she is up ambulating, or just gets up from standing.  She has been compliant with her home medication, they say that at baseline her pulse rate when she measures it at home on a weekly basis is usually in the mid 60s.  This morning, she got up feeling a little bit shaky and sweaty, got up to go make breakfast and was feeling hungry.  Mention to her husband that she felt sick to her stomach, and had a syncopal episode.  He says that she fell onto her tailbone, no seizure-like activity, and woke up not confused.  Per EMS, her heart rate was in the mid 40s on their arrival, blood pressure 150/80, saturating well on room air.  ED Course: On evaluation in the emergency department, heart rate remains in the 50s ER provider mentions that has seen it in the 40s here as well.  Blood pressure has been, but on the high side, continues to be saturating well on room air.  She is otherwise asymptomatic.  Lab work including CBC, CMP, troponin are unremarkable.  TSH ordered by ER provider.  Hospitalist contacted for admission.  Review of Systems: Please see HPI for pertinent positives and negatives. A complete 10 system review of systems are otherwise negative.  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    CAD in native artery 06/25/2019   Cataract    unsure of which eye   Colon polyps    Environmental allergies    Essential hypertension 06/25/2019   Genetic testing 04/28/2017   Multi-Cancer panel (83 genes) @ [facility] - No pathogenic mutations detected   GERD (gastroesophageal reflux disease)    Glaucoma    High cholesterol    Hypertension    IBS (irritable bowel syndrome)    Memory change    Osteoporosis 06/2016   T score -2.8   PONV (postoperative nausea and vomiting)    Pure hypercholesterolemia 06/25/2019   Seizures (HCC)    childhood, 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   Past Surgical History:  Procedure Laterality Date   ABDOMINAL HYSTERECTOMY     BLADDER SUSPENSION N/A 04/08/2016   Procedure: TRANSVAGINAL TAPE (TVT) PROCEDURE;  Surgeon: Genia Del, MD;  Location: [facility];  Service: Gynecology;  Laterality: N/A;   BREAST SURGERY     cyst removal   CARDIAC CATHETERIZATION     CATARACT EXTRACTION, BILATERAL     COLONOSCOPY     CYSTOSCOPY N/A 04/08/2016   Procedure: CYSTOSCOPY;  Surgeon: Genia Del, MD;  Location: [facility];  Service: Gynecology;  Laterality: N/A;   DENTAL SURGERY  03/31/2016   ELBOW FRACTURE SURGERY     EYE SURGERY Left    blood clot on retina   ROBOTIC ASSISTED TOTAL HYSTERECTOMY WITH  BILATERAL SALPINGO OOPHERECTOMY Bilateral 04/08/2016   Procedure: ROBOTIC ASSISTED TOTAL HYSTERECTOMY WITH BILATERAL SALPINGO OOPHORECTOMY/Uterosacral Ligament Suspension;  Surgeon: Genia Del, MD;  Location: [facility];  Service: Gynecology;  Laterality: Bilateral;  Request 4 hrs.   TUBAL LIGATION     WRIST ARTHROSCOPY      Social History:  reports that she quit smoking about 53 years ago. Her smoking use included cigarettes. She has never used smokeless tobacco. She reports current alcohol use of about 1.0 standard drink of alcohol per week. She reports that she does not use drugs.   Allergies  Allergen Reactions   Codeine Other (See Comments)    Knocked  her out Other reaction(s): Unknown   Meloxicam Other (See Comments)    BLURRY VISION, DIZZY, CONFUSION  Other reaction(s): Unknown   Sulfamethoxazole-Trimethoprim     Other reaction(s): Unknown   Tramadol Other (See Comments)    CONFUSION     Family History  Problem Relation Age of Onset   Macular degeneration Mother    Hearing loss Mother    CAD Mother    Diabetes Father        dx in his 56s-60s   Heart disease Father    Rheumatic fever Father    Pancreatic cancer Brother 50       non-smoker; deceased 78   Cancer Maternal Grandmother        abdominal; unsure of primary; deceased 26   Heart disease Maternal Grandfather    Congestive Heart Failure Paternal Grandfather    Other Brother        at birth   Cancer Other        MGMs sister, abdominal; unsure of primary; deceased 44s   Pancreatic cancer Maternal Uncle 52       non-smoker; deceased 3   Alcohol abuse Maternal Uncle    Dementia Paternal Aunt    Congestive Heart Failure Paternal Uncle    Leukemia Cousin        female; daughter of mat uncle with pancreatic ca; deceased 20     Prior to Admission medications   Medication Sig Start Date End Date Taking? Authorizing Provider  alendronate (FOSAMAX) 70 MG tablet Take 1 tablet by mouth every 7 days. Take with a full glass of water on an empty stomach Patient taking differently: Take 70 mg by mouth once a week. 06/20/22  Yes Genia Del, MD  amLODipine (NORVASC) 5 MG tablet Take 1 tablet (5 mg total) by mouth daily. 04/12/22  Yes Alver Sorrow, NP  donepezil (ARICEPT) 10 MG tablet Take 10 mg by mouth at bedtime.   Yes [provider]  hydrochlorothiazide (MICROZIDE) 12.5 MG capsule Take 1 capsule (12.5 mg total) by mouth daily. 04/12/22  Yes Alver Sorrow, NP  Multiple Vitamin (MULTIVITAMIN WITH MINERALS) TABS tablet Take 1 tablet by mouth daily.   Yes [provider]  ondansetron (ZOFRAN-ODT) 4 MG disintegrating tablet Take 4 mg by mouth every  8 (eight) hours as needed for nausea or vomiting. 10/15/22  Yes [provider]  pravastatin (PRAVACHOL) 40 MG tablet Take 1 tablet (40 mg total) by mouth every evening. 04/14/22  Yes Alver Sorrow, NP  timolol (TIMOPTIC) 0.25 % ophthalmic solution Place 1 drop into both eyes daily.   Yes [provider]  valsartan (DIOVAN) 320 MG tablet Take 1 tablet (320 mg total) by mouth daily. Please schedule appointment with Dr. Duke Salvia for refills. 04/12/22  Yes Alver Sorrow, NP  ezetimibe (ZETIA) 10  MG tablet Take 1 tablet (10 mg total) by mouth daily. 04/14/22   Alver Sorrow, NP    Physical Exam: BP (!) 149/42   Pulse (!) 57   Temp (!) 97.5 F (36.4 C) (Oral)   Resp 12   Ht 5\' 2"  (1.575 m)   Wt 63.5 kg   SpO2 96%   BMI 25.61 kg/m   General:  Alert, oriented, calm, in no acute distress  Eyes: EOMI, clear conjuctivae, white sclerea Neck: supple, no masses, trachea mildline  Cardiovascular: RRR, no murmurs or rubs, no peripheral edema  Respiratory: clear to auscultation bilaterally, no wheezes, no crackles  Abdomen: soft, nontender, nondistended, normal bowel tones heard  Skin: dry, no rashes  Musculoskeletal: no joint effusions, normal range of motion  Psychiatric: appropriate affect, normal speech  Neurologic: extraocular muscles intact, clear speech, moving all extremities with intact sensorium          Labs on Admission:  Basic Metabolic Panel: Recent Labs  Lab 11/03/22 1015  NA 141  K 3.4*  CL 111  CO2 22  GLUCOSE 100*  BUN 17  CREATININE 0.68  CALCIUM 8.3*   Liver Function Tests: Recent Labs  Lab 11/03/22 1015  AST 18  ALT 15  ALKPHOS 33*  BILITOT 0.5  PROT 6.0*  ALBUMIN 3.3*   No results for input(s): "LIPASE", "AMYLASE" in the last 168 hours. No results for input(s): "AMMONIA" in the last 168 hours. CBC: Recent Labs  Lab 11/03/22 1015  WBC 5.0  NEUTROABS 3.4  HGB 12.5  HCT 37.0  MCV 95.6  PLT 249   Cardiac Enzymes: No  results for input(s): "CKTOTAL", "CKMB", "CKMBINDEX", "TROPONINI" in the last 168 hours.  BNP (last 3 results) No results for input(s): "BNP" in the last 8760 hours.  ProBNP (last 3 results) No results for input(s): "PROBNP" in the last 8760 hours.  CBG: No results for input(s): "GLUCAP" in the last 168 hours.  Radiological Exams on Admission: CT PELVIS WO CONTRAST  Result Date: 11/03/2022 CLINICAL DATA:  Pain after fall EXAM: CT PELVIS WITHOUT CONTRAST TECHNIQUE: Multidetector CT imaging of the pelvis was performed following the standard protocol without intravenous contrast. RADIATION DOSE REDUCTION: This exam was performed according to the departmental dose-optimization program which includes automated exposure control, adjustment of the mA and/or kV according to patient size and/or use of iterative reconstruction technique. COMPARISON:  None Available. FINDINGS: Urinary Tract:  Preserved contours of the urinary bladder Bowel: There is moderate stool in the rectosigmoid colon. The visualized small and large bowel in the pelvis is nondilated. There are some sigmoid colon diverticula. Vascular/Lymphatic: Scattered vascular calcifications along the visualized aorta and iliac vessels. No specific abnormal lymph node enlargement identified in the pelvis Reproductive:  Uterus is not seen.  No separate adnexal mass. Other: No free fluid in the pelvis. Small fat containing inguinal hernias Musculoskeletal: Osteopenia. No fracture or dislocation. Slight degenerative changes along the sacroiliac joints. Degenerative changes seen of the lower lumbar spine. Slight joint space loss of the hip joints. Grossly normal course and caliber to the sacrum and coccyx. 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 No clear fracture or dislocation. Electronically Signed   By: Karen Kays M.D.   On: 11/03/2022 11:51    CT Head Wo Contrast  Result Date: 11/03/2022 CLINICAL DATA:  Head trauma, moderate-severe Fall; Neck trauma (Age >= 65y) Fall EXAM: CT HEAD WITHOUT CONTRAST  CT CERVICAL SPINE WITHOUT CONTRAST TECHNIQUE: Multidetector CT imaging of the head and cervical spine was performed following the standard protocol without intravenous contrast. Multiplanar CT image reconstructions of the cervical spine were also generated. RADIATION DOSE REDUCTION: This exam was performed according to the departmental dose-optimization program which includes automated exposure control, adjustment of the mA and/or kV according to patient size and/or use of iterative reconstruction technique. COMPARISON:  None Available. FINDINGS: CT HEAD FINDINGS Brain: No evidence of acute infarction, hemorrhage, hydrocephalus, extra-axial collection or mass lesion/mass effect. Partially empty sella Vascular: No hyperdense vessel or unexpected calcification. Skull: Normal. Negative for fracture or focal lesion. Sinuses/Orbits: No middle ear or mastoid effusion. Paranasal sinuses clear. Bilateral lens replacement. Orbits are otherwise unremarkable. Other: None. CT CERVICAL SPINE FINDINGS Alignment: Normal. Skull base and vertebrae: No acute fracture. No primary bone lesion or focal pathologic process. Soft tissues and spinal canal: No prevertebral fluid or swelling. No visible canal hematoma. Disc levels:  No evidence of high-grade spinal canal stenosis. Upper chest: Negative. Other: None IMPRESSION: 1. No acute intracranial abnormality. 2. No acute fracture or traumatic malalignment of the cervical spine. Electronically Signed   By: Lorenza Cambridge M.D.   On: 11/03/2022 11:39   CT Cervical Spine Wo Contrast  Result Date: 11/03/2022 CLINICAL DATA:  Head trauma, moderate-severe Fall; Neck trauma (Age >= 65y) Fall EXAM: CT HEAD WITHOUT CONTRAST CT CERVICAL SPINE WITHOUT CONTRAST TECHNIQUE: Multidetector CT imaging of the head and cervical spine was  performed following the standard protocol without intravenous contrast. Multiplanar CT image reconstructions of the cervical spine were also generated. RADIATION DOSE REDUCTION: This exam was performed according to the departmental dose-optimization program which includes automated exposure control, adjustment of the mA and/or kV according to patient size and/or use of iterative reconstruction technique. COMPARISON:  None Available. FINDINGS: CT HEAD FINDINGS Brain: No evidence of acute infarction, hemorrhage, hydrocephalus, extra-axial collection or mass lesion/mass effect. Partially empty sella Vascular: No hyperdense vessel or unexpected calcification. Skull: Normal. Negative for fracture or focal lesion. Sinuses/Orbits: No middle ear or mastoid effusion. Paranasal sinuses clear. Bilateral lens replacement. Orbits are otherwise unremarkable. Other: None. CT CERVICAL SPINE FINDINGS Alignment: Normal. Skull base and vertebrae: No acute fracture. No primary bone lesion or focal pathologic process. Soft tissues and spinal canal: No prevertebral fluid or swelling. No visible canal hematoma. Disc levels:  No evidence of high-grade spinal canal stenosis. Upper chest: Negative. Other: None IMPRESSION: 1. No acute intracranial abnormality. 2. No acute fracture or traumatic malalignment of the cervical spine. Electronically Signed   By: Lorenza Cambridge M.D.   On: 11/03/2022 11:39   DG Chest 2 View  Result Date: 11/03/2022 CLINICAL DATA:  Seen CP.  Fall. EXAM: CHEST - 2 VIEW COMPARISON:  Chest x-ray 05/25/2019 FINDINGS: Hyperinflation. Apical pleural thickening. No consolidation, pneumothorax or effusion. No edema. Overlapping cardiac leads. Calcified aorta. There is dense right upper lung nodule, unchanged from previous and likely calcified granuloma. If there is further concern of the sequela of trauma, a contrast CT could be considered as clinically appropriate for further delineation of injury. IMPRESSION:  Hyperinflation.  No acute cardiopulmonary disease. Electronically Signed   By: Karen Kays M.D.   On: 11/03/2022 11:14    Assessment/Plan This is a pleasant 82 year old female with a history of hypertension, IBS, GERD being admitted to the hospital with syncopal episode after 6 to 8 weeks of intermittent dizziness.  Syncope-notable that patient is somewhat bradycardic, she also may have orthostasis.  No obvious  murmur, heart failure symptoms, electrolyte abnormality to explain her dizziness and syncope. -Observation admission -Continuous telemetry -2D echo -Check orthostatics -PT/OT -Follow-up TSH -Inpatient cardiology consultation requested  Hypertension-continue home amlodipine, hydrochlorothiazide  Dementia-continue Aricept  HLD-continue Pravachol  DVT prophylaxis: Lovenox     Code Status: Full Code  Consults called: [facility]  Admission status: Observation   Time spent: 48 minutes  Tuyet Bader Sharlette Dense MD [facility] Pager [phone]  If 7PM-7AM, please contact night-coverage [email or web address] Password [facility]  11/03/2022, 12:52 PM

## 2022-11-03 NOTE — ED Provider Notes (Signed)
[place] EMERGENCY DEPARTMENT AT [facility] Provider Note   CSN: [id number] Arrival date & time: 11/03/22  0915     History  Chief Complaint  Patient presents with   Loss of Consciousness   Fall    Sandra Wheeler is a 82 y.o. female.   Loss of Consciousness Fall  -year-old female history of hypertension, hyperlipidemia, GERD presenting for syncope.  Patient was with her husband in an independent living facility.  She reports she recently has been doing well other than some intermittent dizziness for the last few weeks.  It is random, makes her feel off balance.  She has been seen by multiple doctors for this without any clear diagnosis.  Today she was at home with her husband.  She remembers feeling somewhat off and then passing out or nearly passing out.  She hit her tailbone and has pain to her tailbone.  Husband states she was walking to the kitchen when she stated she felt sick and then passed out.  Unsure if she hit her head.  She has not had any fevers, cough, chest pain, shortness of breath, abdominal pain.  No vomiting or diarrhea.  Normal p.o. intake.  Recently stopped memantine, no other medication changes.  Currently she has pain to her tailbone no other symptoms.     Home Medications Prior to Admission medications   Medication Sig Start Date End Date Taking? Authorizing Provider  alendronate (FOSAMAX) 70 MG tablet Take 1 tablet by mouth every 7 days. Take with a full glass of water on an empty stomach Patient taking differently: Take 70 mg by mouth once a week. 06/20/22  Yes Genia Del, MD  amLODipine (NORVASC) 5 MG tablet Take 1 tablet (5 mg total) by mouth daily. 04/12/22  Yes Alver Sorrow, NP  donepezil (ARICEPT) 10 MG tablet Take 10 mg by mouth at bedtime.   Yes [provider]  hydrochlorothiazide (MICROZIDE) 12.5 MG capsule Take 1 capsule (12.5 mg total) by mouth daily. 04/12/22  Yes Alver Sorrow, NP  Multiple Vitamin  (MULTIVITAMIN WITH MINERALS) TABS tablet Take 1 tablet by mouth daily.   Yes [provider]  ondansetron (ZOFRAN-ODT) 4 MG disintegrating tablet Take 4 mg by mouth every 8 (eight) hours as needed for nausea or vomiting. 10/15/22  Yes [provider]  pravastatin (PRAVACHOL) 40 MG tablet Take 1 tablet (40 mg total) by mouth every evening. 04/14/22  Yes Alver Sorrow, NP  timolol (TIMOPTIC) 0.25 % ophthalmic solution Place 1 drop into both eyes daily.   Yes [provider]  valsartan (DIOVAN) 320 MG tablet Take 1 tablet (320 mg total) by mouth daily. Please schedule appointment with Dr. Duke Salvia for refills. 04/12/22  Yes Alver Sorrow, NP  ezetimibe (ZETIA) 10 MG tablet Take 1 tablet (10 mg total) by mouth daily. 04/14/22   Alver Sorrow, NP      Allergies    Codeine, Meloxicam, Sulfamethoxazole-trimethoprim, and Tramadol    Review of Systems   Review of Systems  Cardiovascular:  Positive for syncope.  Review of systems completed and notable as per HPI.  ROS otherwise negative.   Physical Exam Updated Vital Signs BP (!) 144/49 (BP Location: Right Arm)   Pulse (!) 55   Temp (!) 97.4 F (36.3 C) (Oral)   Resp 16   Ht 5\' 2"  (1.575 m)   Wt 63.5 kg   SpO2 97%   BMI 25.61 kg/m  Physical Exam Vitals and nursing note  reviewed.  Constitutional:      General: She is not in acute distress.    Appearance: She is well-developed.  HENT:     Head: Normocephalic and atraumatic.     Mouth/Throat:     Mouth: Mucous membranes are moist.     Pharynx: Oropharynx is clear.  Eyes:     Conjunctiva/sclera: Conjunctivae normal.  Cardiovascular:     Rate and Rhythm: Regular rhythm. Bradycardia present.     Heart sounds: No murmur heard. Pulmonary:     Effort: Pulmonary effort is normal. No respiratory distress.     Breath sounds: Normal breath sounds.  Abdominal:     Palpations: Abdomen is soft.     Tenderness: There is no abdominal tenderness. There is no  guarding or rebound.  Musculoskeletal:        General: No swelling.     Cervical back: Normal range of motion and neck supple. No rigidity or tenderness.     Right lower leg: No edema.     Left lower leg: No edema.     Comments: No spinal tenderness.  Skin:    General: Skin is warm and dry.     Capillary Refill: Capillary refill takes less than 2 seconds.  Neurological:     General: No focal deficit present.     Mental Status: She is alert and oriented to person, place, and time. Mental status is at baseline.     Cranial Nerves: No cranial nerve deficit.     Sensory: No sensory deficit.     Motor: No weakness.     Coordination: Coordination normal.     Deep Tendon Reflexes: Reflexes normal.  Psychiatric:        Mood and Affect: Mood normal.     ED Results / Procedures / Treatments   Labs (all labs ordered are listed, but only abnormal results are displayed) Labs Reviewed  COMPREHENSIVE METABOLIC PANEL - Abnormal; Notable for the following components:      Result Value   Potassium 3.4 (*)    Glucose, Bld 100 (*)    Calcium 8.3 (*)    Total Protein 6.0 (*)    Albumin 3.3 (*)    Alkaline Phosphatase 33 (*)    All other components within normal limits  URINALYSIS, ROUTINE W REFLEX MICROSCOPIC - Abnormal; Notable for the following components:   Leukocytes,Ua SMALL (*)    Bacteria, UA RARE (*)    All other components within normal limits  CBC WITH DIFFERENTIAL/PLATELET  TSH  MAGNESIUM  D-DIMER, QUANTITATIVE  BASIC METABOLIC PANEL  CBC  TROPONIN I (HIGH SENSITIVITY)  TROPONIN I (HIGH SENSITIVITY)    EKG EKG Interpretation Date/Time:  Thursday November 03 2022 10:03:14 EDT Ventricular Rate:  51 PR Interval:  164 QRS Duration:  104 QT Interval:  493 QTC Calculation: 455 R Axis:   63  Text Interpretation: Sinus bradycardia Probable anteroseptal infarct, old Confirmed by Fulton Reek [phone]) on 11/03/2022 10:05:37 AM  Radiology CT PELVIS WO CONTRAST  Result Date:  11/03/2022 CLINICAL DATA:  Pain after fall EXAM: CT PELVIS WITHOUT CONTRAST TECHNIQUE: Multidetector CT imaging of the pelvis was performed following the standard protocol without intravenous contrast. RADIATION DOSE REDUCTION: This exam was performed according to the departmental dose-optimization program which includes automated exposure control, adjustment of the mA and/or kV according to patient size and/or use of iterative reconstruction technique. COMPARISON:  None Available. FINDINGS: Urinary Tract:  Preserved contours of the urinary bladder Bowel: There is moderate stool in  the rectosigmoid colon. The visualized small and large bowel in the pelvis is nondilated. There are some sigmoid colon diverticula. Vascular/Lymphatic: Scattered vascular calcifications along the visualized aorta and iliac vessels. No specific abnormal lymph node enlargement identified in the pelvis Reproductive:  Uterus is not seen.  No separate adnexal mass. Other: No free fluid in the pelvis. Small fat containing inguinal hernias Musculoskeletal: Osteopenia. No fracture or dislocation. Slight degenerative changes along the sacroiliac joints. Degenerative changes seen of the lower lumbar spine. Slight joint space loss of the hip joints. Grossly normal course and caliber to the sacrum and coccyx. 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 No clear fracture or dislocation. Electronically Signed   By: Karen Kays M.D.   On: 11/03/2022 11:51   CT Head Wo Contrast  Result Date: 11/03/2022 CLINICAL DATA:  Head trauma, moderate-severe Fall; Neck trauma (Age >= 65y) Fall EXAM: CT HEAD WITHOUT CONTRAST CT CERVICAL SPINE WITHOUT CONTRAST TECHNIQUE: Multidetector CT imaging of the head and cervical spine was performed following the standard protocol without intravenous contrast. Multiplanar CT image reconstructions of the cervical spine  were also generated. RADIATION DOSE REDUCTION: This exam was performed according to the departmental dose-optimization program which includes automated exposure control, adjustment of the mA and/or kV according to patient size and/or use of iterative reconstruction technique. COMPARISON:  None Available. FINDINGS: CT HEAD FINDINGS Brain: No evidence of acute infarction, hemorrhage, hydrocephalus, extra-axial collection or mass lesion/mass effect. Partially empty sella Vascular: No hyperdense vessel or unexpected calcification. Skull: Normal. Negative for fracture or focal lesion. Sinuses/Orbits: No middle ear or mastoid effusion. Paranasal sinuses clear. Bilateral lens replacement. Orbits are otherwise unremarkable. Other: None. CT CERVICAL SPINE FINDINGS Alignment: Normal. Skull base and vertebrae: No acute fracture. No primary bone lesion or focal pathologic process. Soft tissues and spinal canal: No prevertebral fluid or swelling. No visible canal hematoma. Disc levels:  No evidence of high-grade spinal canal stenosis. Upper chest: Negative. Other: None IMPRESSION: 1. No acute intracranial abnormality. 2. No acute fracture or traumatic malalignment of the cervical spine. Electronically Signed   By: Lorenza Cambridge M.D.   On: 11/03/2022 11:39   CT Cervical Spine Wo Contrast  Result Date: 11/03/2022 CLINICAL DATA:  Head trauma, moderate-severe Fall; Neck trauma (Age >= 65y) Fall EXAM: CT HEAD WITHOUT CONTRAST CT CERVICAL SPINE WITHOUT CONTRAST TECHNIQUE: Multidetector CT imaging of the head and cervical spine was performed following the standard protocol without intravenous contrast. Multiplanar CT image reconstructions of the cervical spine were also generated. RADIATION DOSE REDUCTION: This exam was performed according to the departmental dose-optimization program which includes automated exposure control, adjustment of the mA and/or kV according to patient size and/or use of iterative reconstruction technique.  COMPARISON:  None Available. FINDINGS: CT HEAD FINDINGS Brain: No evidence of acute infarction, hemorrhage, hydrocephalus, extra-axial collection or mass lesion/mass effect. Partially empty sella Vascular: No hyperdense vessel or unexpected calcification. Skull: Normal. Negative for fracture or focal lesion. Sinuses/Orbits: No middle ear or mastoid effusion. Paranasal sinuses clear. Bilateral lens replacement. Orbits are otherwise unremarkable. Other: None. CT CERVICAL SPINE FINDINGS Alignment: Normal. Skull base and vertebrae: No acute fracture. No primary bone lesion or focal pathologic process. Soft tissues and spinal canal: No prevertebral fluid or swelling. No visible canal hematoma. Disc levels:  No evidence of high-grade spinal canal stenosis. Upper chest: Negative. Other: None IMPRESSION: 1. No acute intracranial abnormality. 2. No acute fracture or  traumatic malalignment of the cervical spine. Electronically Signed   By: Lorenza Cambridge M.D.   On: 11/03/2022 11:39   DG Chest 2 View  Result Date: 11/03/2022 CLINICAL DATA:  Seen CP.  Fall. EXAM: CHEST - 2 VIEW COMPARISON:  Chest x-ray 05/25/2019 FINDINGS: Hyperinflation. Apical pleural thickening. No consolidation, pneumothorax or effusion. No edema. Overlapping cardiac leads. Calcified aorta. There is dense right upper lung nodule, unchanged from previous and likely calcified granuloma. If there is further concern of the sequela of trauma, a contrast CT could be considered as clinically appropriate for further delineation of injury. IMPRESSION: Hyperinflation.  No acute cardiopulmonary disease. Electronically Signed   By: Karen Kays M.D.   On: 11/03/2022 11:14    Procedures Procedures    Medications Ordered in ED Medications  pravastatin (PRAVACHOL) tablet 40 mg (has no administration in time range)  donepezil (ARICEPT) tablet 10 mg (has no administration in time range)  multivitamin with minerals tablet 1 tablet (has no administration in time  range)  enoxaparin (LOVENOX) injection 40 mg (has no administration in time range)  acetaminophen (TYLENOL) tablet 650 mg (650 mg Oral Given 11/03/22 1338)    Or  acetaminophen (TYLENOL) suppository 650 mg ( Rectal See Alternative 11/03/22 1338)  traZODone (DESYREL) tablet 25 mg (has no administration in time range)  ondansetron (ZOFRAN) tablet 4 mg (has no administration in time range)    Or  ondansetron (ZOFRAN) injection 4 mg (has no administration in time range)  albuterol (PROVENTIL) (2.5 MG/3ML) 0.083% nebulizer solution 2.5 mg (has no administration in time range)  irbesartan (AVAPRO) tablet 300 mg (has no administration in time range)  lactated ringers bolus 500 mL (0 mLs Intravenous Stopped 11/03/22 1242)  potassium chloride SA (KLOR-CON M) CR tablet 40 mEq (40 mEq Oral Given 11/03/22 1335)    ED Course/ Medical Decision Making/ A&P                                 Medical Decision Making Amount and/or Complexity of Data Reviewed Labs: ordered. Radiology: ordered.  Risk Decision regarding hospitalization.   Medical Decision Making:   KARLI WESTBERRY is a 82 y.o. female who presented to the ED today with episode of syncope and fall.  Vital signs notable for bradycardia which per patient is new for her.  Presentation concerning for syncope and fall.  She has some pain to her tailbone, unsure if she hit her head.  Will obtain CT head, cervical spine, pelvis to evaluate for pelvic fracture given tailbone pain as well as chest x-ray.  She has no abdominal pain and no other spinal tenderness.  Differential for syncope is broad, she has had some recent dizziness which could be presyncope.  Also consider symptomatic bradycardia, metabolic abnormality, infection.   Patient placed on continuous vitals and telemetry monitoring while in ED which was reviewed periodically.  Reviewed and confirmed nursing documentation for past medical history, family history, social history.  Initial Study  Results:   Laboratory  All laboratory results reviewed.  Labs notable for CMP notable for potassium 3.4.  EKG EKG was reviewed independently.  Radiology:  All images reviewed independently.  No acute traumatic injury.  Agree with radiology report at this time.  Reassessment and Plan:   On reassessment she is feeling slightly better.  She remains intermittently bradycardic.  Appears she has had this before although appears worse than prior.  Unsure if this is  symptomatic bradycardia.  However syncopal episode is concerning.  I think she warrants admission for telemetry and further workup.  Discussed with hospitalist and admitted.   Patient's presentation is most consistent with acute presentation with potential threat to life or bodily function.           Final Clinical Impression(s) / ED Diagnoses Final diagnoses:  Syncope, unspecified syncope type    Rx / DC Orders ED Discharge Orders     None         Laurence Spates, MD 11/03/22 [phone]

## 2022-11-03 NOTE — Consult Note (Signed)
Cardiology Consultation:   Patient ID: Sandra Wheeler MRN: [id number]; DOB: [birth year]  Admit date: 11/03/2022 Date of Consult: 11/03/2022  Primary Care Provider: Mahlon Gammon, MD [facility] Cardiologist: Sandra Si, MD  [facility] Electrophysiologist:  None    Patient Profile:   Sandra Wheeler is a 82 y.o. female with a hx of hypertension, mild dementia, GERD, CAD diagnosed in 2001, hyperlipidemia who is being seen today for the evaluation of bradycardia and syncope at the request of Sandra Kirby Crigler, MD.      History of Present Illness:   [name] is a 82 year old female with a history of hypertension, mild dementia, GERD, CAD, hyperlipidemia who is followed by Dr. Duke Wheeler.  She apparently has had episodes of dizziness for the past few months but no other associated symptoms.  They occur sporadically but usually when she is up walking around or gets up from a sitting position her heart rate usually is in the 60s when she takes it when she is measuring her blood pressure more.  When she awakened this morning she felt very shaky and sweaty.  She got up to make breakfast and then all of a sudden felt sick to her stomach and passed out.  Her husband says she fell on her bottom.  There was no witnessed seizure activity but did wake up confused.  EMS was called and her heart rate was in the mid 40s at that time but blood pressure was 150/80 mmHg.  In the ER her heart rate is maintained in the 50s but as low as the 40s at times.  Labs including troponin are normal.  TSH is normal at 3.349.  She denies any chest pain, shortness of breath, PND, orthopnea, lower extremity edema, palpitations.  She has no history of syncope prior to this.  She takes her medications as prescribed.   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/25/2019   Cataract    unsure of which eye   Colon polyps     Environmental allergies    Essential hypertension 06/25/2019   Genetic testing 04/28/2017   Multi-Cancer panel (83 genes) @ [facility] - No pathogenic mutations detected   GERD (gastroesophageal reflux disease)    Glaucoma    High cholesterol    Hypertension    IBS (irritable bowel syndrome)    Memory change    Osteoporosis 06/2016   T score -2.8   PONV (postoperative nausea and vomiting)    Pure hypercholesterolemia 06/25/2019   Seizures (HCC)    childhood, 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    Past Surgical History:  Procedure Laterality Date   ABDOMINAL HYSTERECTOMY     BLADDER SUSPENSION N/A 04/08/2016   Procedure: TRANSVAGINAL TAPE (TVT) PROCEDURE;  Surgeon: Sandra Del, MD;  Location: [facility];  Service: Gynecology;  Laterality: N/A;   BREAST SURGERY     cyst removal   CARDIAC CATHETERIZATION     CATARACT EXTRACTION, BILATERAL     COLONOSCOPY     CYSTOSCOPY N/A 04/08/2016   Procedure: CYSTOSCOPY;  Surgeon: Sandra Del, MD;  Location: [facility];  Service: Gynecology;  Laterality: N/A;   DENTAL SURGERY  03/31/2016   ELBOW FRACTURE SURGERY     EYE SURGERY Left    blood clot on retina   ROBOTIC ASSISTED TOTAL HYSTERECTOMY WITH BILATERAL SALPINGO OOPHERECTOMY Bilateral 04/08/2016   Procedure: ROBOTIC  ASSISTED TOTAL HYSTERECTOMY WITH BILATERAL SALPINGO OOPHORECTOMY/Uterosacral Ligament Suspension;  Surgeon: Sandra Del, MD;  Location: [facility];  Service: Gynecology;  Laterality: Bilateral;  Request 4 hrs.   TUBAL LIGATION     WRIST ARTHROSCOPY       Home Medications:  Prior to Admission medications   Medication Sig Start Date End Date Taking? Authorizing Provider  alendronate (FOSAMAX) 70 MG tablet Take 1 tablet by mouth every 7 days. Take with a full glass of water on an empty stomach Patient taking differently: Take 70 mg by mouth once a week. 06/20/22  Yes Sandra Del, MD  amLODipine (NORVASC) 5 MG tablet Take 1 tablet (5 mg total) by mouth daily. 04/12/22   Yes Sandra Sorrow, NP  donepezil (ARICEPT) 10 MG tablet Take 10 mg by mouth at bedtime.   Yes [provider]  hydrochlorothiazide (MICROZIDE) 12.5 MG capsule Take 1 capsule (12.5 mg total) by mouth daily. 04/12/22  Yes Sandra Sorrow, NP  Multiple Vitamin (MULTIVITAMIN WITH MINERALS) TABS tablet Take 1 tablet by mouth daily.   Yes [provider]  ondansetron (ZOFRAN-ODT) 4 MG disintegrating tablet Take 4 mg by mouth every 8 (eight) hours as needed for nausea or vomiting. 10/15/22  Yes [provider]  pravastatin (PRAVACHOL) 40 MG tablet Take 1 tablet (40 mg total) by mouth every evening. 04/14/22  Yes Sandra Sorrow, NP  timolol (TIMOPTIC) 0.25 % ophthalmic solution Place 1 drop into both eyes daily.   Yes [provider]  valsartan (DIOVAN) 320 MG tablet Take 1 tablet (320 mg total) by mouth daily. Please schedule appointment with Dr. Duke Wheeler for refills. 04/12/22  Yes Sandra Sorrow, NP  ezetimibe (ZETIA) 10 MG tablet Take 1 tablet (10 mg total) by mouth daily. 04/14/22   Sandra Sorrow, NP    Inpatient Medications: Scheduled Meds:  [START ON 11/04/2022] amLODipine  5 mg Oral Daily   donepezil  10 mg Oral QHS   enoxaparin (LOVENOX) injection  40 mg Subcutaneous QHS   hydrochlorothiazide  12.5 mg Oral Daily   [START ON 11/04/2022] multivitamin with minerals  1 tablet Oral Daily   pravastatin  40 mg Oral QPM   timolol  1 drop Both Eyes Daily   Continuous Infusions:  PRN Meds: acetaminophen **OR** acetaminophen, albuterol, ondansetron **OR** ondansetron (ZOFRAN) IV, traZODone  Allergies:    Allergies  Allergen Reactions   Codeine Other (See Comments)    Knocked her out Other reaction(s): Unknown   Meloxicam Other (See Comments)    BLURRY VISION, DIZZY, CONFUSION  Other reaction(s): Unknown   Sulfamethoxazole-Trimethoprim     Other reaction(s): Unknown   Tramadol Other (See Comments)    CONFUSION     Social History:   Social  History   Socioeconomic History   Marital status: Married    Spouse name: Sandra Wheeler   Number of children: 1   Years of education: Not on file   Highest education level: Not on file  Occupational History   Not on file  Tobacco Use   Smoking status: Former    Current packs/day: 0.00    Types: Cigarettes    Quit date: 03/21/1969    Years since quitting: 53.6   Smokeless tobacco: Never  Vaping Use   Vaping status: Never Used  Substance and Sexual Activity   Alcohol use: Yes    Alcohol/week: 1.0 standard drink of alcohol    Types: 1 Glasses of wine per week    Comment: 7 a week  Drug use: No   Sexual activity: Not Currently    Birth control/protection: Surgical    Comment: First intercouse at age 20. Only one partner.  Other Topics Concern   Not on file  Social History Narrative   11/17/21 lives with husband   Coffee 1-2 daily   Social Determinants of Health   Financial Resource Strain: Not on file  Food Insecurity: Not on file  Transportation Needs: Not on file  Physical Activity: Not on file  Stress: Not on file  Social Connections: Not on file  Intimate Partner Violence: Not on file    Family History:    Family History  Problem Relation Age of Onset   Macular degeneration Mother    Hearing loss Mother    CAD Mother    Diabetes Father        dx in his 27s-60s   Heart disease Father    Rheumatic fever Father    Pancreatic cancer Brother 62       non-smoker; deceased 33   Cancer Maternal Grandmother        abdominal; unsure of primary; deceased 95   Heart disease Maternal Grandfather    Congestive Heart Failure Paternal Grandfather    Other Brother        at birth   Cancer Other        MGMs sister, abdominal; unsure of primary; deceased 80s   Pancreatic cancer Maternal Uncle 51       non-smoker; deceased 4   Alcohol abuse Maternal Uncle    Dementia Paternal Aunt    Congestive Heart Failure Paternal Uncle    Leukemia Cousin        female; daughter of mat  uncle with pancreatic ca; deceased 20     ROS:  Please see the history of present illness.   All other ROS reviewed and negative.     Physical Exam/Data:   Vitals:   11/03/22 1300 11/03/22 1316 11/03/22 1330 11/03/22 1345  BP: (!) 145/86  (!) 155/52 (!) 146/82  Pulse: (!) 57  (!) 51 (!) 50  Resp: 17  10 16   Temp:  97.6 F (36.4 C)    TempSrc:  Oral    SpO2: 94%  96% 98%  Weight:      Height:        Intake/Output Summary (Last 24 hours) at 11/03/2022 1444 Last data filed at 11/03/2022 0924 Gross per 24 hour  Intake 250 ml  Output --  Net 250 ml      11/03/2022    9:27 AM 09/20/2022    3:08 PM 09/14/2022    9:06 AM  Last 3 Weights  Weight (lbs) 140 lb 137 lb 8 oz 139 lb 3.2 oz  Weight (kg) 63.504 kg 62.37 kg 63.141 kg     Body mass index is 25.61 kg/m.  General:  Well nourished, well developed, in no acute distress HEENT: normal Lymph: no adenopathy Neck: no JVD Endocrine:  No thryomegaly Vascular: No carotid bruits; FA pulses 2+ bilaterally without bruits  Cardiac:  normal S1, S2; RRR; no murmur  Lungs:  clear to auscultation bilaterally, no wheezing, rhonchi or rales  Abd: soft, nontender, no hepatomegaly  Ext: no edema Musculoskeletal:  No deformities, BUE and BLE strength normal and equal Skin: warm and dry  Neuro:  CNs 2-12 intact, no focal abnormalities noted Psych:  Normal affect   EKG:  The EKG was personally reviewed and demonstrates: Sinus bradycardia 51 bpm with anterior septal infarct  age undetermined Telemetry:  Telemetry was personally reviewed and demonstrates: Sinus bradycardia  Laboratory Data:  High Sensitivity Troponin:   Recent Labs  Lab 11/03/22 1015 11/03/22 1220  TROPONINIHS 4 3     Chemistry Recent Labs  Lab 11/03/22 1015  NA 141  K 3.4*  CL 111  CO2 22  GLUCOSE 100*  BUN 17  CREATININE 0.68  CALCIUM 8.3*  GFRNONAA >60  ANIONGAP 8    Recent Labs  Lab 11/03/22 1015  PROT 6.0*  ALBUMIN 3.3*  AST 18  ALT 15   ALKPHOS 33*  BILITOT 0.5   Hematology Recent Labs  Lab 11/03/22 1015  WBC 5.0  RBC 3.87  HGB 12.5  HCT 37.0  MCV 95.6  MCH 32.3  MCHC 33.8  RDW 11.5  PLT 249   BNPNo results for input(s): "BNP", "PROBNP" in the last 168 hours.  DDimer No results for input(s): "DDIMER" in the last 168 hours.   Radiology/Studies:  CT PELVIS WO CONTRAST  Result Date: 11/03/2022 CLINICAL DATA:  Pain after fall EXAM: CT PELVIS WITHOUT CONTRAST TECHNIQUE: Multidetector CT imaging of the pelvis was performed following the standard protocol without intravenous contrast. RADIATION DOSE REDUCTION: This exam was performed according to the departmental dose-optimization program which includes automated exposure control, adjustment of the mA and/or kV according to patient size and/or use of iterative reconstruction technique. COMPARISON:  None Available. FINDINGS: Urinary Tract:  Preserved contours of the urinary bladder Bowel: There is moderate stool in the rectosigmoid colon. The visualized small and large bowel in the pelvis is nondilated. There are some sigmoid colon diverticula. Vascular/Lymphatic: Scattered vascular calcifications along the visualized aorta and iliac vessels. No specific abnormal lymph node enlargement identified in the pelvis Reproductive:  Uterus is not seen.  No separate adnexal mass. Other: No free fluid in the pelvis. Small fat containing inguinal hernias Musculoskeletal: Osteopenia. No fracture or dislocation. Slight degenerative changes along the sacroiliac joints. Degenerative changes seen of the lower lumbar spine. Slight joint space loss of the hip joints. Grossly normal course and caliber to the sacrum and coccyx. 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 No clear fracture or dislocation. Electronically Signed   By: Karen Kays M.D.   On: 11/03/2022 11:51   CT Head Wo  Contrast  Result Date: 11/03/2022 CLINICAL DATA:  Head trauma, moderate-severe Fall; Neck trauma (Age >= 65y) Fall EXAM: CT HEAD WITHOUT CONTRAST CT CERVICAL SPINE WITHOUT CONTRAST TECHNIQUE: Multidetector CT imaging of the head and cervical spine was performed following the standard protocol without intravenous contrast. Multiplanar CT image reconstructions of the cervical spine were also generated. RADIATION DOSE REDUCTION: This exam was performed according to the departmental dose-optimization program which includes automated exposure control, adjustment of the mA and/or kV according to patient size and/or use of iterative reconstruction technique. COMPARISON:  None Available. FINDINGS: CT HEAD FINDINGS Brain: No evidence of acute infarction, hemorrhage, hydrocephalus, extra-axial collection or mass lesion/mass effect. Partially empty sella Vascular: No hyperdense vessel or unexpected calcification. Skull: Normal. Negative for fracture or focal lesion. Sinuses/Orbits: No middle ear or mastoid effusion. Paranasal sinuses clear. Bilateral lens replacement. Orbits are otherwise unremarkable. Other: None. CT CERVICAL SPINE FINDINGS Alignment: Normal. Skull base and vertebrae: No acute fracture. No primary bone lesion or focal pathologic process. Soft tissues and spinal canal: No prevertebral fluid or swelling. No visible canal hematoma. Disc levels:  No evidence of high-grade spinal canal  stenosis. Upper chest: Negative. Other: None IMPRESSION: 1. No acute intracranial abnormality. 2. No acute fracture or traumatic malalignment of the cervical spine. Electronically Signed   By: Lorenza Cambridge M.D.   On: 11/03/2022 11:39   CT Cervical Spine Wo Contrast  Result Date: 11/03/2022 CLINICAL DATA:  Head trauma, moderate-severe Fall; Neck trauma (Age >= 65y) Fall EXAM: CT HEAD WITHOUT CONTRAST CT CERVICAL SPINE WITHOUT CONTRAST TECHNIQUE: Multidetector CT imaging of the head and cervical spine was performed following  the standard protocol without intravenous contrast. Multiplanar CT image reconstructions of the cervical spine were also generated. RADIATION DOSE REDUCTION: This exam was performed according to the departmental dose-optimization program which includes automated exposure control, adjustment of the mA and/or kV according to patient size and/or use of iterative reconstruction technique. COMPARISON:  None Available. FINDINGS: CT HEAD FINDINGS Brain: No evidence of acute infarction, hemorrhage, hydrocephalus, extra-axial collection or mass lesion/mass effect. Partially empty sella Vascular: No hyperdense vessel or unexpected calcification. Skull: Normal. Negative for fracture or focal lesion. Sinuses/Orbits: No middle ear or mastoid effusion. Paranasal sinuses clear. Bilateral lens replacement. Orbits are otherwise unremarkable. Other: None. CT CERVICAL SPINE FINDINGS Alignment: Normal. Skull base and vertebrae: No acute fracture. No primary bone lesion or focal pathologic process. Soft tissues and spinal canal: No prevertebral fluid or swelling. No visible canal hematoma. Disc levels:  No evidence of high-grade spinal canal stenosis. Upper chest: Negative. Other: None IMPRESSION: 1. No acute intracranial abnormality. 2. No acute fracture or traumatic malalignment of the cervical spine. Electronically Signed   By: Lorenza Cambridge M.D.   On: 11/03/2022 11:39   DG Chest 2 View  Result Date: 11/03/2022 CLINICAL DATA:  Seen CP.  Fall. EXAM: CHEST - 2 VIEW COMPARISON:  Chest x-ray 05/25/2019 FINDINGS: Hyperinflation. Apical pleural thickening. No consolidation, pneumothorax or effusion. No edema. Overlapping cardiac leads. Calcified aorta. There is dense right upper lung nodule, unchanged from previous and likely calcified granuloma. If there is further concern of the sequela of trauma, a contrast CT could be considered as clinically appropriate for further delineation of injury. IMPRESSION: Hyperinflation.  No acute  cardiopulmonary disease. Electronically Signed   By: Karen Kays M.D.   On: 11/03/2022 11:14     Assessment and Plan:   Sinus bradycardia/syncope -She tells me that her heart rates are always in the 60s at home when she measures her blood pressure but her BP has been running higher -Got up this morning feeling shaky and diaphoretic and while fixing breakfast all of a sudden became nauseated and had syncope falling to the ground hitting her bottom.  She was confused when she awakened but there was no seizure activity and the event was witnessed by her husband. -Found to be bradycardic in the 40s by EMS and since arrival in the ER heart rates of in the 50s. -Symptoms are concerning for a bradycardic event resulting in syncope or a combination of orthostatic hypotension along with chronotropic incompetence in the setting of bradycardia.  She has been having problems recently with dizziness when she is standing up too long or going from sitting to standing. -The only rate slowing medication she is on is timolol eyedrops and I would go ahead and stop those. -Check 2D echo to rule out valvular heart disease and assess LV function -Unlikely to be due to PE but will get a D-dimer -Check orthostatic BPs -She is not on any SA nodal or AV nodal blocking agents -Unclear yet if she will need a  permanent pacemaker  2.  Hypertension -BP is mildly elevated on exam -Restart home dose of valsartan 320 mg daily and stop her amlodipine given her bradycardia which can sometimes remotely have an effect on heart rate -Stop diuretic given that she sounds like she is having some orthostatic symptoms  For questions or updates, please contact [facility] Please consult [email or web address] for contact info under    Signed, Armanda Magic, MD  11/03/2022 2:44 PM

## 2022-11-03 NOTE — ED Notes (Signed)
ED TO INPATIENT HANDOFF REPORT  Name/Age/Gender Sandra Wheeler 82 y.o. female  Code Status    Code Status Orders  (From admission, onward)           Start     Ordered   11/03/22 1252  Full code  Continuous       Question:  By:  Answer:  Consent: discussion documented in EHR   11/03/22 1252           Code Status History     Date Active Date Inactive Code Status Order ID Comments User Context   04/08/2016 1447 04/09/2016 1450 Full Code [id number]  Genia Del, MD Inpatient       Home/SNF/Other Home  Chief Complaint Syncope [R55]  Level of Care/Admitting Diagnosis ED Disposition     ED Disposition  Admit   Condition  --   Comment  Hospital Area: [facility] [[id number]]  Level of Care: Telemetry [5]  Admit to tele based on following criteria: Eval of Syncope  May place patient in observation at [facility] or [facility] if equivalent level of care is available:: Yes  Covid Evaluation: Asymptomatic - no recent exposure (last 10 days) testing not required  Diagnosis: Syncope [206001]  Admitting Physician: Maryln Gottron [[id number]]  Attending Physician: Kirby Crigler, MIR [email or web address] [[id number]]          Medical History 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/25/2019   Cataract    unsure of which eye   Colon polyps    Environmental allergies    Essential hypertension 06/25/2019   Genetic testing 04/28/2017   Multi-Cancer panel (83 genes) @ [facility] - No pathogenic mutations detected   GERD (gastroesophageal reflux disease)    Glaucoma    High cholesterol    Hypertension    IBS (irritable bowel syndrome)    Memory change    Osteoporosis 06/2016   T score -2.8   PONV (postoperative nausea and vomiting)    Pure hypercholesterolemia 06/25/2019   Seizures (HCC)    childhood, 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    Allergies Allergies   Allergen Reactions   Codeine Other (See Comments)    Knocked her out Other reaction(s): Unknown   Meloxicam Other (See Comments)    BLURRY VISION, DIZZY, CONFUSION  Other reaction(s): Unknown   Sulfamethoxazole-Trimethoprim     Other reaction(s): Unknown   Tramadol Other (See Comments)    CONFUSION     IV Location/Drains/Wounds Patient Lines/Drains/Airways Status     Active Line/Drains/Airways     Name Placement date Placement time Site Days   Peripheral IV 20 G Anterior;Left Hand --  --  Hand  --   Incision (Closed) 04/08/16 Vagina Other (Comment) 04/08/16  0814  -- 2400   Incision (Closed) 04/08/16 Abdomen Other (Comment) 04/08/16  0919  -- 2400   Incision - 2 Ports Abdomen 1: Right;Lower 2: Left;Lower 04/08/16  --  -- 2400   Incision - 5 Ports Abdomen 1: Umbilicus 2: Right;Medial 3: Right;Lateral 4: Left;Medial 5: Left;Lateral 04/08/16  --  -- 2400            Labs/Imaging Results for orders placed or performed during the hospital encounter of 11/03/22 (from the past 48 hour(s))  Comprehensive metabolic panel     Status: Abnormal   Collection Time: 11/03/22 10:15 AM  Result Value Ref Range  Sodium 141 135 - 145 mmol/L   Potassium 3.4 (L) 3.5 - 5.1 mmol/L   Chloride 111 98 - 111 mmol/L   CO2 22 22 - 32 mmol/L   Glucose, Bld 100 (H) 70 - 99 mg/dL    Comment: Glucose reference range applies only to samples taken after fasting for at least 8 hours.   BUN 17 8 - 23 mg/dL   Creatinine, Ser 4.09 0.44 - 1.00 mg/dL   Calcium 8.3 (L) 8.9 - 10.3 mg/dL   Total Protein 6.0 (L) 6.5 - 8.1 g/dL   Albumin 3.3 (L) 3.5 - 5.0 g/dL   AST 18 15 - 41 U/L   ALT 15 0 - 44 U/L   Alkaline Phosphatase 33 (L) 38 - 126 U/L   Total Bilirubin 0.5 0.3 - 1.2 mg/dL   GFR, Estimated >81 >19 mL/min    Comment: (NOTE) Calculated using the CKD-EPI Creatinine Equation (2021)    Anion gap 8 5 - 15    Comment: Performed at [facility] [address], [place], [place] [address]   CBC with Differential     Status: None   Collection Time: 11/03/22 10:15 AM  Result Value Ref Range   WBC 5.0 4.0 - 10.5 K/uL   RBC 3.87 3.87 - 5.11 MIL/uL   Hemoglobin 12.5 12.0 - 15.0 g/dL   HCT 95.6 21.3 - 08.6 %   MCV 95.6 80.0 - 100.0 fL   MCH 32.3 26.0 - 34.0 pg   MCHC 33.8 30.0 - 36.0 g/dL   RDW 57.8 46.9 - 62.9 %   Platelets 249 150 - 400 K/uL   nRBC 0.0 0.0 - 0.2 %   Neutrophils Relative % 67 %   Neutro Abs 3.4 1.7 - 7.7 K/uL   Lymphocytes Relative 22 %   Lymphs Abs 1.1 0.7 - 4.0 K/uL   Monocytes Relative 8 %   Monocytes Absolute 0.4 0.1 - 1.0 K/uL   Eosinophils Relative 2 %   Eosinophils Absolute 0.1 0.0 - 0.5 K/uL   Basophils Relative 1 %   Basophils Absolute 0.0 0.0 - 0.1 K/uL   Immature Granulocytes 0 %   Abs Immature Granulocytes 0.02 0.00 - 0.07 K/uL    Comment: Performed at [facility], [address] [place], [place] [address]  Troponin I (High Sensitivity)     Status: None   Collection Time: 11/03/22 10:15 AM  Result Value Ref Range   Troponin I (High Sensitivity) 4 <18 ng/L    Comment: (NOTE) Elevated high sensitivity troponin I (hsTnI) values and significant  changes across serial measurements may suggest ACS but many other  chronic and acute conditions are known to elevate hsTnI results.  Refer to the "Links" section for chest pain algorithms and additional  guidance. Performed at [facility], [address] [place], [place] [address]   Urinalysis, Routine w reflex microscopic -Urine, Clean Catch     Status: Abnormal   Collection Time: 11/03/22 11:37 AM  Result Value Ref Range   Color, Urine YELLOW YELLOW   APPearance CLEAR CLEAR   Specific Gravity, Urine 1.010 1.005 - 1.030   pH 8.0 5.0 - 8.0   Glucose, UA NEGATIVE NEGATIVE mg/dL   Hgb urine dipstick NEGATIVE NEGATIVE   Bilirubin Urine NEGATIVE NEGATIVE   Ketones, ur NEGATIVE NEGATIVE mg/dL   Protein, ur NEGATIVE NEGATIVE mg/dL   Nitrite NEGATIVE  NEGATIVE   Leukocytes,Ua SMALL (A) NEGATIVE   RBC / HPF 0-5 0 -  5 RBC/hpf   WBC, UA 0-5 0 - 5 WBC/hpf   Bacteria, UA RARE (A) NONE SEEN   Squamous Epithelial / HPF 0-5 0 - 5 /HPF   Mucus PRESENT     Comment: Performed at [facility], [address], [place], [place] [address]  Troponin I (High Sensitivity)     Status: None   Collection Time: 11/03/22 12:20 PM  Result Value Ref Range   Troponin I (High Sensitivity) 3 <18 ng/L    Comment: (NOTE) Elevated high sensitivity troponin I (hsTnI) values and significant  changes across serial measurements may suggest ACS but many other  chronic and acute conditions are known to elevate hsTnI results.  Refer to the "Links" section for chest pain algorithms and additional  guidance. Performed at [facility], [address], [place], [place] [address]   TSH     Status: None   Collection Time: 11/03/22 12:20 PM  Result Value Ref Range   TSH 3.349 0.350 - 4.500 uIU/mL    Comment: Performed by a 3rd Generation assay with a functional sensitivity of <=0.01 uIU/mL. Performed at [facility], [address], [place], [place] [address]   Magnesium     Status: None   Collection Time: 11/03/22 12:20 PM  Result Value Ref Range   Magnesium 2.0 1.7 - 2.4 mg/dL    Comment: Performed at [facility], [address], [place], [place] [address]   CT PELVIS WO CONTRAST  Result Date: 11/03/2022 CLINICAL DATA:  Pain after fall EXAM: CT PELVIS WITHOUT CONTRAST TECHNIQUE: Multidetector CT imaging of the pelvis was performed following the standard protocol without intravenous contrast. RADIATION DOSE REDUCTION: This exam was performed according to the departmental dose-optimization program which includes automated exposure control, adjustment of the mA and/or kV according to patient size and/or use of iterative reconstruction technique. COMPARISON:  None Available. FINDINGS: Urinary  Tract:  Preserved contours of the urinary bladder Bowel: There is moderate stool in the rectosigmoid colon. The visualized small and large bowel in the pelvis is nondilated. There are some sigmoid colon diverticula. Vascular/Lymphatic: Scattered vascular calcifications along the visualized aorta and iliac vessels. No specific abnormal lymph node enlargement identified in the pelvis Reproductive:  Uterus is not seen.  No separate adnexal mass. Other: No free fluid in the pelvis. Small fat containing inguinal hernias Musculoskeletal: Osteopenia. No fracture or dislocation. Slight degenerative changes along the sacroiliac joints. Degenerative changes seen of the lower lumbar spine. Slight joint space loss of the hip joints. Grossly normal course and caliber to the sacrum and coccyx. 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 No clear fracture or dislocation. Electronically Signed   By: Karen Kays M.D.   On: 11/03/2022 11:51   CT Head Wo Contrast  Result Date: 11/03/2022 CLINICAL DATA:  Head trauma, moderate-severe Fall; Neck trauma (Age >= 65y) Fall EXAM: CT HEAD WITHOUT CONTRAST CT CERVICAL SPINE WITHOUT CONTRAST TECHNIQUE: Multidetector CT imaging of the head and cervical spine was performed following the standard protocol without intravenous contrast. Multiplanar CT image reconstructions of the cervical spine were also generated. RADIATION DOSE REDUCTION: This exam was performed according to the departmental dose-optimization program which includes automated exposure control, adjustment of the mA and/or kV according to patient size and/or use of iterative reconstruction technique. COMPARISON:  None Available. FINDINGS: CT HEAD FINDINGS Brain: No evidence of acute infarction, hemorrhage, hydrocephalus,  extra-axial collection or mass lesion/mass effect. Partially empty sella Vascular: No hyperdense vessel or  unexpected calcification. Skull: Normal. Negative for fracture or focal lesion. Sinuses/Orbits: No middle ear or mastoid effusion. Paranasal sinuses clear. Bilateral lens replacement. Orbits are otherwise unremarkable. Other: None. CT CERVICAL SPINE FINDINGS Alignment: Normal. Skull base and vertebrae: No acute fracture. No primary bone lesion or focal pathologic process. Soft tissues and spinal canal: No prevertebral fluid or swelling. No visible canal hematoma. Disc levels:  No evidence of high-grade spinal canal stenosis. Upper chest: Negative. Other: None IMPRESSION: 1. No acute intracranial abnormality. 2. No acute fracture or traumatic malalignment of the cervical spine. Electronically Signed   By: Lorenza Cambridge M.D.   On: 11/03/2022 11:39   CT Cervical Spine Wo Contrast  Result Date: 11/03/2022 CLINICAL DATA:  Head trauma, moderate-severe Fall; Neck trauma (Age >= 65y) Fall EXAM: CT HEAD WITHOUT CONTRAST CT CERVICAL SPINE WITHOUT CONTRAST TECHNIQUE: Multidetector CT imaging of the head and cervical spine was performed following the standard protocol without intravenous contrast. Multiplanar CT image reconstructions of the cervical spine were also generated. RADIATION DOSE REDUCTION: This exam was performed according to the departmental dose-optimization program which includes automated exposure control, adjustment of the mA and/or kV according to patient size and/or use of iterative reconstruction technique. COMPARISON:  None Available. FINDINGS: CT HEAD FINDINGS Brain: No evidence of acute infarction, hemorrhage, hydrocephalus, extra-axial collection or mass lesion/mass effect. Partially empty sella Vascular: No hyperdense vessel or unexpected calcification. Skull: Normal. Negative for fracture or focal lesion. Sinuses/Orbits: No middle ear or mastoid effusion. Paranasal sinuses clear. Bilateral lens replacement. Orbits are otherwise unremarkable. Other: None. CT CERVICAL SPINE FINDINGS Alignment:  Normal. Skull base and vertebrae: No acute fracture. No primary bone lesion or focal pathologic process. Soft tissues and spinal canal: No prevertebral fluid or swelling. No visible canal hematoma. Disc levels:  No evidence of high-grade spinal canal stenosis. Upper chest: Negative. Other: None IMPRESSION: 1. No acute intracranial abnormality. 2. No acute fracture or traumatic malalignment of the cervical spine. Electronically Signed   By: Lorenza Cambridge M.D.   On: 11/03/2022 11:39   DG Chest 2 View  Result Date: 11/03/2022 CLINICAL DATA:  Seen CP.  Fall. EXAM: CHEST - 2 VIEW COMPARISON:  Chest x-ray 05/25/2019 FINDINGS: Hyperinflation. Apical pleural thickening. No consolidation, pneumothorax or effusion. No edema. Overlapping cardiac leads. Calcified aorta. There is dense right upper lung nodule, unchanged from previous and likely calcified granuloma. If there is further concern of the sequela of trauma, a contrast CT could be considered as clinically appropriate for further delineation of injury. IMPRESSION: Hyperinflation.  No acute cardiopulmonary disease. Electronically Signed   By: Karen Kays M.D.   On: 11/03/2022 11:14    Pending Labs Unresulted Labs (From admission, onward)     Start     Ordered   11/04/22 0500  Basic metabolic panel  Tomorrow morning,   R        11/03/22 1252   11/04/22 0500  CBC  Tomorrow morning,   R        11/03/22 1252            Vitals/Pain Today's Vitals   11/03/22 0945 11/03/22 1139 11/03/22 1230 11/03/22 1316  BP: (!) 153/53 (!) 170/67 (!) 149/42   Pulse: (!) 50 65 (!) 57   Resp: 17 16 12    Temp:    97.6 F (36.4 C)  TempSrc:    Oral  SpO2: 99% 95% 96%  Weight:      Height:      PainSc:        Isolation Precautions No active isolations  Medications Medications  pravastatin (PRAVACHOL) tablet 40 mg (has no administration in time range)  donepezil (ARICEPT) tablet 10 mg (has no administration in time range)  timolol (TIMOPTIC) 0.25 %  ophthalmic solution 1 drop (1 drop Both Eyes Given 11/03/22 1335)  multivitamin with minerals tablet 1 tablet (has no administration in time range)  amLODipine (NORVASC) tablet 5 mg (has no administration in time range)  hydrochlorothiazide (HYDRODIURIL) tablet 12.5 mg (12.5 mg Oral Given 11/03/22 1335)  enoxaparin (LOVENOX) injection 40 mg (has no administration in time range)  acetaminophen (TYLENOL) tablet 650 mg (650 mg Oral Given 11/03/22 1338)    Or  acetaminophen (TYLENOL) suppository 650 mg ( Rectal See Alternative 11/03/22 1338)  traZODone (DESYREL) tablet 25 mg (has no administration in time range)  ondansetron (ZOFRAN) tablet 4 mg (has no administration in time range)    Or  ondansetron (ZOFRAN) injection 4 mg (has no administration in time range)  albuterol (PROVENTIL) (2.5 MG/3ML) 0.083% nebulizer solution 2.5 mg (has no administration in time range)  lactated ringers bolus 500 mL (0 mLs Intravenous Stopped 11/03/22 1242)  potassium chloride SA (KLOR-CON M) CR tablet 40 mEq (40 mEq Oral Given 11/03/22 1335)    Mobility walks

## 2022-11-04 ENCOUNTER — Observation Stay (HOSPITAL_BASED_OUTPATIENT_CLINIC_OR_DEPARTMENT_OTHER): Payer: Medicare Other

## 2022-11-04 ENCOUNTER — Other Ambulatory Visit: Payer: Self-pay | Admitting: Student

## 2022-11-04 DIAGNOSIS — R001 Bradycardia, unspecified: Secondary | ICD-10-CM | POA: Insufficient documentation

## 2022-11-04 DIAGNOSIS — R55 Syncope and collapse: Secondary | ICD-10-CM

## 2022-11-04 LAB — BASIC METABOLIC PANEL
Anion gap: 11 (ref 5–15)
BUN: 17 mg/dL (ref 8–23)
CO2: 19 mmol/L — ABNORMAL LOW (ref 22–32)
Calcium: 8.4 mg/dL — ABNORMAL LOW (ref 8.9–10.3)
Chloride: 106 mmol/L (ref 98–111)
Creatinine, Ser: 0.64 mg/dL (ref 0.44–1.00)
GFR, Estimated: >60 mL/min (ref >60)
Glucose, Bld: 93 mg/dL (ref 70–99)
Potassium: 4 mmol/L (ref 3.5–5.1)
Sodium: 136 mmol/L (ref 135–145)

## 2022-11-04 LAB — CBC
HCT: 38.8 % (ref 36.0–46.0)
Hemoglobin: 12.5 g/dL (ref 12.0–15.0)
MCH: 32.7 pg (ref 26.0–34.0)
MCHC: 32.2 g/dL (ref 30.0–36.0)
MCV: 101.6 fL — ABNORMAL HIGH (ref 80.0–100.0)
Platelets: 223 10*3/uL (ref 150–400)
RBC: 3.82 MIL/uL — ABNORMAL LOW (ref 3.87–5.11)
RDW: 11.7 % (ref 11.5–15.5)
WBC: 5.1 10*3/uL (ref 4.0–10.5)
nRBC: 0 % (ref 0.0–0.2)

## 2022-11-04 LAB — ECHOCARDIOGRAM COMPLETE
AR max vel: 1.66 cm2
AV Area VTI: 1.66 cm2
AV Area mean vel: 1.65 cm2
AV Mean grad: 4 mmHg
AV Peak grad: 7.4 mmHg
Ao pk vel: 1.36 m/s
Area-P 1/2: 3.6 cm2
Height: 62 in
S' Lateral: 2.3 cm
Weight: 2240 [oz_av]

## 2022-11-04 NOTE — Evaluation (Signed)
Occupational Therapy Evaluation Patient Details Name: Sandra Wheeler MRN: [id number] DOB: 02-05-1941 Today's Date: 11/04/2022   History of Present Illness 82 y.o. female with medical history significant for hypertension, IBS, mild dementia, GERD who is being admitted to the hospital with syncope.   Clinical Impression   The pt performed all assessed tasks independently, including sit to stand, simulated lower body dressing, and ambulating without an assistive device. She denied experiencing dizziness or lightheadedness during the session. The pt appears to be at her baseline level of functioning for self-care management & she is not presenting with functional deficits that warrant the need for further OT services. OT will sign off and recommend she return home at discharge.        If plan is discharge home, recommend the following: Assist for transportation    Functional Status Assessment  Patient has not had a recent decline in their functional status  Equipment Recommendations  None recommended by OT    Recommendations for Other Services       Precautions / Restrictions Precautions Precautions: Fall Precaution Comments: tripped at beauty shop about 1-2 months ago no injury; syncopal event just prior to admission, no other falls in past 6 months Restrictions Weight Bearing Restrictions: No      Mobility Bed Mobility Overal bed mobility: Independent                  Transfers Overall transfer level: Independent       Balance Overall balance assessment: Independent         ADL either performed or assessed with clinical judgement   ADL Overall ADL's : At baseline;Modified independent;Independent                          Pertinent Vitals/Pain Pain Assessment Pain Assessment: Faces Pain Score: 5  Pain Location: sacrum Pain Descriptors / Indicators: Sore Pain Intervention(s): Limited activity within patient's tolerance, Monitored during session,  Patient requesting pain meds-RN notified, RN gave pain meds during session     Extremity/Trunk Assessment Upper Extremity Assessment Upper Extremity Assessment: Defer to OT evaluation   Lower Extremity Assessment Lower Extremity Assessment: Overall WFL for tasks assessed     Communication Communication Communication: No apparent difficulties   Cognition Arousal: Alert Behavior During Therapy: WFL for tasks assessed/performed Overall Cognitive Status: Within Functional Limits for tasks assessed                     Home Living Family/patient expects to be discharged to:: Private residence Living Arrangements: Spouse/significant other Available Help at Discharge: Family Type of Home: Independent living facility ([facility]) Home Access: Level entry     Home Layout: One level     Bathroom Shower/Tub: Walk-in shower         Home Equipment: Information systems manager - built in          Prior Functioning/Environment Prior Level of Function : Independent/Modified Independent             Mobility Comments: walks without AD ADLs Comments: independent     Co-evaluation PT/OT/SLP Co-Evaluation/Treatment: Yes Reason for Co-Treatment: For patient/therapist safety PT goals addressed during session: Mobility/safety with mobility;Balance OT goals addressed during session: ADL's and self-care      AM-PAC OT "6 Clicks" Daily Activity     Outcome Measure Help from another person eating meals?: None Help from another person taking care of personal grooming?: None Help from another person toileting, which  includes using toliet, bedpan, or urinal?: None Help from another person bathing (including washing, rinsing, drying)?: None Help from another person to put on and taking off regular upper body clothing?: None Help from another person to put on and taking off regular lower body clothing?: None 6 Click Score: 24   End of Session Equipment Utilized During Treatment: Rolling  walker (2 wheels);Gait belt Nurse Communication: Patient requests pain meds  Activity Tolerance: Patient tolerated treatment well Patient left: in chair;with call bell/phone within reach;with family/visitor present  OT Visit Diagnosis: History of falling (Z91.81)                Time: 6213-0865 OT Time Calculation (min): 20 min Charges:  OT General Charges $OT Visit: 1 Visit OT Evaluation $OT Eval Low Complexity: 1 Low    Rosalia Mcavoy L Kataleia Quaranta, OTR/L 11/04/2022, 11:30 AM

## 2022-11-04 NOTE — Hospital Course (Signed)
Sandra Wheeler is a 82 y.o. female with a history of primary hypertension, IBS, mild dementia, GERD.  Patient presented secondary to syncope with associated bradycardia and heart rates down to 40s to 50s.  Cardiology was consulted.  Patient monitored on telemetry.  Home atenolol, Aricept and amlodipine discontinued secondary to bradycardia.  Echocardiogram obtained and was significant for LVEF of 60 to 65% with normal aortic valve.  Cardiology recommendation for outpatient ZIO patch and outpatient follow-up.  Patient to follow-up with primary care physician.

## 2022-11-04 NOTE — Discharge Summary (Signed)
Physician Discharge Summary   Patient: Sandra Wheeler MRN: [id number] DOB: 14-May-1940  Admit date:     11/03/2022  Discharge date: 11/04/22  Discharge Physician: Jacquelin Hawking, MD   PCP: Mahlon Gammon, MD   Recommendations at discharge:  Hospital follow-up with PCP Cardiology recommending Zio patch and outpatient cardiology follow-up Neurology follow-up for management of dementia  Discharge Diagnoses: Principal Problem:   Syncope Active Problems:   CAD in native artery   Essential hypertension   Pure hypercholesterolemia   Dementia (HCC)   Sinus bradycardia  Resolved Problems:   * No resolved hospital problems. *  Hospital Course: Sandra Wheeler is a 82 y.o. female with a history of primary hypertension, IBS, mild dementia, GERD.  Patient presented secondary to syncope with associated bradycardia and heart rates down to 40s to 50s.  Cardiology was consulted.  Patient monitored on telemetry.  Home atenolol, Aricept and amlodipine discontinued secondary to bradycardia.  Echocardiogram obtained and was significant for LVEF of 60 to 65% with normal aortic valve.  Cardiology recommendation for outpatient ZIO patch and outpatient follow-up.  Patient to follow-up with primary care physician.  Assessment and Plan:  Syncope Concern for possible bradycardia event contributing to syncope versus possible orthostasis.  Orthostatic vitals appear to be normal this admission.  Cardiology was consulted.  Amlodipine, Aricept, timolol were discontinued to prevent possible further bradycardia.  Transthoracic echocardiogram obtained and was significant for normal LVEF with normal aortic valve.  Cardiology recommendation for outpatient Zio patch and follow-up.  Sinus bradycardia Possibly medication mediated.  Home amlodipine, Aricept, and timolol were discontinued.  Cardiology recommendation for Zio patch as an outpatient.  Primary hypertension Patient is on valsartan, hydrochlorothiazide,  amlodipine as an outpatient.  Initial concern for possible orthostatic hypotension, which was not present.  Amlodipine discontinued secondary to possible bradycardia effect.  Recommendation to continue valsartan on discharge. Continue hydrochlorothiazide since no orthostatic hypotension.  Dementia Aricept discontinued secondary to bradycardia effect. Follow-up with neurology.  Hyperlipidemia Continue pravastatin.   Consultants: Cardiology Procedures performed: Transthoracic Echocardiogram  Disposition:  Independent living facility Diet recommendation: Cardiac diet   DISCHARGE MEDICATION: Allergies as of 11/04/2022       Reactions   Codeine Other (See Comments)   Knocked her out Other reaction(s): Unknown   Meloxicam Other (See Comments)   BLURRY VISION, DIZZY, CONFUSION  Other reaction(s): Unknown   Sulfamethoxazole-trimethoprim    Other reaction(s): Unknown   Tramadol Other (See Comments)   CONFUSION         Medication List     STOP taking these medications    amLODipine 5 MG tablet Commonly known as: NORVASC   donepezil 10 MG tablet Commonly known as: ARICEPT   hydrochlorothiazide 12.5 MG capsule Commonly known as: MICROZIDE   timolol 0.25 % ophthalmic solution Commonly known as: TIMOPTIC       TAKE these medications    alendronate 70 MG tablet Commonly known as: FOSAMAX Take 1 tablet by mouth every 7 days. Take with a full glass of water on an empty stomach What changed: See the new instructions.   ezetimibe 10 MG tablet Commonly known as: ZETIA Take 1 tablet (10 mg total) by mouth daily.   multivitamin with minerals Tabs tablet Take 1 tablet by mouth daily.   ondansetron 4 MG disintegrating tablet Commonly known as: ZOFRAN-ODT Take 4 mg by mouth every 8 (eight) hours as needed for nausea or vomiting.   pravastatin 40 MG tablet Commonly known as: PRAVACHOL Take 1 tablet (  40 mg total) by mouth every evening.   valsartan 320 MG  tablet Commonly known as: DIOVAN Take 1 tablet (320 mg total) by mouth daily. Please schedule appointment with Dr. Duke Salvia for refills.        Follow-up Information     Mahlon Gammon, MD. Schedule an appointment as soon as possible for a visit in 1 week(s).   Specialty: Internal Medicine Why: For hospital follow-up Contact information: [address] [place] [address] [phone]         Quintella Reichert, MD. Schedule an appointment as soon as possible for a visit in 2 week(s).   Specialty: Cardiology Why: For hospital follow-up Contact information: [address] [place] [address] [phone]                Discharge Exam: BP (!) 144/45 (BP Location: Right Arm)   Pulse 64   Temp 98.3 F (36.8 C) (Oral)   Resp 15   Ht 5\' 2"  (1.575 m)   Wt 63.5 kg   SpO2 97%   BMI 25.61 kg/m   General exam: Appears calm and comfortable Respiratory system: Clear to auscultation. Respiratory effort normal. Cardiovascular system: S1 & S2 heard, RRR. Gastrointestinal system: Abdomen is nondistended, soft and nontender. Normal bowel sounds heard. Central nervous system: Alert.   Condition at discharge: stable  The results of significant diagnostics from this hospitalization (including imaging, microbiology, ancillary and laboratory) are listed below for reference.   Imaging Studies: ECHOCARDIOGRAM COMPLETE  Result Date: 11/04/2022    ECHOCARDIOGRAM REPORT   Patient Name:   Sandra Wheeler Date of Exam: 11/04/2022 Medical Rec #:  [id number]      Height:       62.0 in Accession #:    [id number]     Weight:       140.0 lb Date of Birth:  [birth year]      BSA:          1.643 m Patient Age:    82 years       BP:           154/55 mmHg Patient Gender: F              HR:           49 bpm. Exam Location:  Inpatient Procedure: 2D Echo, Color Doppler and Cardiac Doppler Indications:    Syncope  History:        Patient has no prior history of Echocardiogram  examinations.                 CAD, Signs/Symptoms:Syncope; Risk Factors:Dyslipidemia and                 Hypertension.  Sonographer:    [facility] Referring Phys: [id number] MIR M [facility] IMPRESSIONS  1. Left ventricular ejection fraction, by estimation, is 60 to 65%. The left ventricle has normal function. The left ventricle has no regional wall motion abnormalities. Left ventricular diastolic parameters are indeterminate.  2. Right ventricular systolic function is normal. The right ventricular size is normal.  3. The mitral valve is normal in structure. Trivial mitral valve regurgitation.  4. The aortic valve is normal in structure. Aortic valve regurgitation is not visualized. FINDINGS  Left Ventricle: Left ventricular ejection fraction, by estimation, is 60 to 65%. The left ventricle has normal function. The left ventricle has no regional wall motion abnormalities. The left ventricular internal cavity size was normal in size. There is  no left ventricular hypertrophy. Left ventricular diastolic parameters are indeterminate. Right Ventricle: The right ventricular size is normal. Right vetricular wall thickness was not assessed. Right ventricular systolic function is normal. Left Atrium: Left atrial size was normal in size. Right Atrium: Right atrial size was normal in size. Pericardium: There is no evidence of pericardial effusion. Mitral Valve: The mitral valve is normal in structure. Trivial mitral valve regurgitation. Tricuspid Valve: The tricuspid valve is normal in structure. Tricuspid valve regurgitation is trivial. Aortic Valve: The aortic valve is normal in structure. Aortic valve regurgitation is not visualized. Aortic valve mean gradient measures 4.0 mmHg. Aortic valve peak gradient measures 7.4 mmHg. Aortic valve area, by VTI measures 1.66 cm. Pulmonic Valve: The pulmonic valve was not well visualized. Pulmonic valve regurgitation is not visualized. No evidence of pulmonic stenosis. Aorta: The aortic root is  normal in size and structure. IAS/Shunts: No atrial level shunt detected by color flow Doppler.  LEFT VENTRICLE PLAX 2D LVIDd:         3.10 cm   Diastology LVIDs:         2.30 cm   LV e' medial:    7.51 cm/s LV PW:         1.00 cm   LV E/e' medial:  13.7 LV IVS:        1.10 cm   LV e' lateral:   6.85 cm/s LVOT diam:     1.80 cm   LV E/e' lateral: 15.0 LV SV:         59 LV SV Index:   36 LVOT Area:     2.54 cm  RIGHT VENTRICLE RV Basal diam:  3.00 cm RV Mid diam:    2.00 cm RV S prime:     7.94 cm/s TAPSE (M-mode): 2.0 cm LEFT ATRIUM             Index        RIGHT ATRIUM          Index LA diam:        3.50 cm 2.13 cm/m   RA Area:     8.61 cm LA Vol (A2C):   42.6 ml 25.93 ml/m  RA Volume:   15.70 ml 9.56 ml/m LA Vol (A4C):   29.5 ml 17.96 ml/m LA Biplane Vol: 37.0 ml 22.52 ml/m  AORTIC VALVE AV Area (Vmax):    1.66 cm AV Area (Vmean):   1.65 cm AV Area (VTI):     1.66 cm AV Vmax:           136.00 cm/s AV Vmean:          92.300 cm/s AV VTI:            0.353 m AV Peak Grad:      7.4 mmHg AV Mean Grad:      4.0 mmHg LVOT Vmax:         88.70 cm/s LVOT Vmean:        60.000 cm/s LVOT VTI:          0.230 m LVOT/AV VTI ratio: 0.65  AORTA Ao Root diam: 2.70 cm MITRAL VALVE                TRICUSPID VALVE MV Area (PHT): 3.60 cm     TR Peak grad:   21.5 mmHg MV Decel Time: 211 msec     TR Vmax:        232.00 cm/s MV E velocity: 103.00 cm/s MV A velocity:  85.70 cm/s   SHUNTS MV E/A ratio:  1.20         Systemic VTI:  0.23 m                             Systemic Diam: 1.80 cm Dietrich Pates MD Electronically signed by Dietrich Pates MD Signature Date/Time: 11/04/2022/12:23:35 PM    Final    CT PELVIS WO CONTRAST  Result Date: 11/03/2022 CLINICAL DATA:  Pain after fall EXAM: CT PELVIS WITHOUT CONTRAST TECHNIQUE: Multidetector CT imaging of the pelvis was performed following the standard protocol without intravenous contrast. RADIATION DOSE REDUCTION: This exam was performed according to the departmental dose-optimization  program which includes automated exposure control, adjustment of the mA and/or kV according to patient size and/or use of iterative reconstruction technique. COMPARISON:  None Available. FINDINGS: Urinary Tract:  Preserved contours of the urinary bladder Bowel: There is moderate stool in the rectosigmoid colon. The visualized small and large bowel in the pelvis is nondilated. There are some sigmoid colon diverticula. Vascular/Lymphatic: Scattered vascular calcifications along the visualized aorta and iliac vessels. No specific abnormal lymph node enlargement identified in the pelvis Reproductive:  Uterus is not seen.  No separate adnexal mass. Other: No free fluid in the pelvis. Small fat containing inguinal hernias Musculoskeletal: Osteopenia. No fracture or dislocation. Slight degenerative changes along the sacroiliac joints. Degenerative changes seen of the lower lumbar spine. Slight joint space loss of the hip joints. Grossly normal course and caliber to the sacrum and coccyx. 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 No clear fracture or dislocation. Electronically Signed   By: Karen Kays M.D.   On: 11/03/2022 11:51   CT Head Wo Contrast  Result Date: 11/03/2022 CLINICAL DATA:  Head trauma, moderate-severe Fall; Neck trauma (Age >= 65y) Fall EXAM: CT HEAD WITHOUT CONTRAST CT CERVICAL SPINE WITHOUT CONTRAST TECHNIQUE: Multidetector CT imaging of the head and cervical spine was performed following the standard protocol without intravenous contrast. Multiplanar CT image reconstructions of the cervical spine were also generated. RADIATION DOSE REDUCTION: This exam was performed according to the departmental dose-optimization program which includes automated exposure control, adjustment of the mA and/or kV according to patient size and/or use of iterative reconstruction technique. COMPARISON:  None  Available. FINDINGS: CT HEAD FINDINGS Brain: No evidence of acute infarction, hemorrhage, hydrocephalus, extra-axial collection or mass lesion/mass effect. Partially empty sella Vascular: No hyperdense vessel or unexpected calcification. Skull: Normal. Negative for fracture or focal lesion. Sinuses/Orbits: No middle ear or mastoid effusion. Paranasal sinuses clear. Bilateral lens replacement. Orbits are otherwise unremarkable. Other: None. CT CERVICAL SPINE FINDINGS Alignment: Normal. Skull base and vertebrae: No acute fracture. No primary bone lesion or focal pathologic process. Soft tissues and spinal canal: No prevertebral fluid or swelling. No visible canal hematoma. Disc levels:  No evidence of high-grade spinal canal stenosis. Upper chest: Negative. Other: None IMPRESSION: 1. No acute intracranial abnormality. 2. No acute fracture or traumatic malalignment of the cervical spine. Electronically Signed   By: Lorenza Cambridge M.D.   On: 11/03/2022 11:39   CT Cervical Spine Wo Contrast  Result Date: 11/03/2022 CLINICAL DATA:  Head trauma, moderate-severe Fall; Neck trauma (Age >= 65y) Fall EXAM: CT HEAD WITHOUT CONTRAST CT CERVICAL SPINE WITHOUT CONTRAST TECHNIQUE: Multidetector CT imaging of the head and cervical spine was performed following the standard protocol without intravenous contrast. Multiplanar  CT image reconstructions of the cervical spine were also generated. RADIATION DOSE REDUCTION: This exam was performed according to the departmental dose-optimization program which includes automated exposure control, adjustment of the mA and/or kV according to patient size and/or use of iterative reconstruction technique. COMPARISON:  None Available. FINDINGS: CT HEAD FINDINGS Brain: No evidence of acute infarction, hemorrhage, hydrocephalus, extra-axial collection or mass lesion/mass effect. Partially empty sella Vascular: No hyperdense vessel or unexpected calcification. Skull: Normal. Negative for fracture  or focal lesion. Sinuses/Orbits: No middle ear or mastoid effusion. Paranasal sinuses clear. Bilateral lens replacement. Orbits are otherwise unremarkable. Other: None. CT CERVICAL SPINE FINDINGS Alignment: Normal. Skull base and vertebrae: No acute fracture. No primary bone lesion or focal pathologic process. Soft tissues and spinal canal: No prevertebral fluid or swelling. No visible canal hematoma. Disc levels:  No evidence of high-grade spinal canal stenosis. Upper chest: Negative. Other: None IMPRESSION: 1. No acute intracranial abnormality. 2. No acute fracture or traumatic malalignment of the cervical spine. Electronically Signed   By: Lorenza Cambridge M.D.   On: 11/03/2022 11:39   DG Chest 2 View  Result Date: 11/03/2022 CLINICAL DATA:  Seen CP.  Fall. EXAM: CHEST - 2 VIEW COMPARISON:  Chest x-ray 05/25/2019 FINDINGS: Hyperinflation. Apical pleural thickening. No consolidation, pneumothorax or effusion. No edema. Overlapping cardiac leads. Calcified aorta. There is dense right upper lung nodule, unchanged from previous and likely calcified granuloma. If there is further concern of the sequela of trauma, a contrast CT could be considered as clinically appropriate for further delineation of injury. IMPRESSION: Hyperinflation.  No acute cardiopulmonary disease. Electronically Signed   By: Karen Kays M.D.   On: 11/03/2022 11:14    Microbiology: Results for orders placed or performed in visit on 03/19/19  Novel Coronavirus, NAA ([facility])     Status: None   Collection Time: 03/19/19  9:33 AM   Specimen: Nasopharyngeal(NP) swabs in vial transport medium   NASOPHARYNGE  TESTING  Result Value Ref Range Status   SARS-CoV-2, NAA Not Detected Not Detected Final    Comment: This nucleic acid amplification test was developed and its performance characteristics determined by [employer]. Nucleic acid amplification tests include PCR and TMA. This test has not been FDA cleared or approved. This test  has been authorized by FDA under an Emergency Use Authorization (EUA). This test is only authorized for the duration of time the declaration that circumstances exist justifying the authorization of the emergency use of in vitro diagnostic tests for detection of SARS-CoV-2 virus and/or diagnosis of COVID-19 infection under section 564(b)(1) of the Act, 21 U.S.C. 409WJX-9(J) (1), unless the authorization is terminated or revoked sooner. When diagnostic testing is negative, the possibility of a false negative result should be considered in the context of a patient's recent exposures and the presence of clinical signs and symptoms consistent with COVID-19. An individual without symptoms of COVID-19 and who is not shedding SARS-CoV-2 virus would  expect to have a negative (not detected) result in this assay.     Labs: CBC: Recent Labs  Lab 11/03/22 1015 11/04/22 0447  WBC 5.0 5.1  NEUTROABS 3.4  --   HGB 12.5 12.5  HCT 37.0 38.8  MCV 95.6 101.6*  PLT 249 223   Basic Metabolic Panel: Recent Labs  Lab 11/03/22 1015 11/03/22 1220 11/04/22 0447  NA 141  --  136  K 3.4*  --  4.0  CL 111  --  106  CO2 22  --  19*  GLUCOSE  100*  --  93  BUN 17  --  17  CREATININE 0.68  --  0.64  CALCIUM 8.3*  --  8.4*  MG  --  2.0  --    Liver Function Tests: Recent Labs  Lab 11/03/22 1015  AST 18  ALT 15  ALKPHOS 33*  BILITOT 0.5  PROT 6.0*  ALBUMIN 3.3*    Discharge time spent: 35 minutes.  Signed: Jacquelin Hawking, MD [facility] 11/04/2022

## 2022-11-04 NOTE — Progress Notes (Addendum)
Progress Note  Patient Name: Sandra Wheeler Date of Encounter: 11/04/2022  [facility] Cardiologist: Chilton Si, MD   Patient Profile      Subjective   Denies any dizziness, CP or SOB.   Inpatient Medications    Scheduled Meds:  donepezil  10 mg Oral QHS   enoxaparin (LOVENOX) injection  40 mg Subcutaneous QHS   irbesartan  300 mg Oral Daily   multivitamin with minerals  1 tablet Oral Daily   pravastatin  40 mg Oral QPM   Continuous Infusions:  PRN Meds: acetaminophen **OR** acetaminophen, albuterol, diphenhydrAMINE, ondansetron **OR** ondansetron (ZOFRAN) IV   Vital Signs    Vitals:   11/03/22 1653 11/03/22 1845 11/03/22 2247 11/04/22 0259  BP: (!) 144/49 (!) 133/58 (!) 138/54 (!) 154/55  Pulse: (!) 55 (!) 57 64 68  Resp:  16    Temp:  98 F (36.7 C) 97.9 F (36.6 C) 97.6 F (36.4 C)  TempSrc:  Oral Oral Oral  SpO2: 97% 96% 96% 97%  Weight:      Height:        Intake/Output Summary (Last 24 hours) at 11/04/2022 0825 Last data filed at 11/03/2022 2152 Gross per 24 hour  Intake 330 ml  Output --  Net 330 ml      11/03/2022    9:27 AM 09/20/2022    3:08 PM 09/14/2022    9:06 AM  Last 3 Weights  Weight (lbs) 140 lb 137 lb 8 oz 139 lb 3.2 oz  Weight (kg) 63.504 kg 62.37 kg 63.141 kg      Telemetry    NSR - Personally Reviewed  ECG    No new EKG to review - Personally Reviewed  Physical Exam   GEN: No acute distress.   Neck: No JVD Cardiac: RRR, no murmurs, rubs, or gallops.  Respiratory: Clear to auscultation bilaterally. GI: Soft, nontender, non-distended  MS: No edema; No deformity. Neuro:  Nonfocal  Psych: Normal affect   Labs    High Sensitivity Troponin:   Recent Labs  Lab 11/03/22 1015 11/03/22 1220  TROPONINIHS 4 3      Chemistry Recent Labs  Lab 11/03/22 1015 11/04/22 0447  NA 141 136  K 3.4* 4.0  CL 111 106  CO2 22 19*  GLUCOSE 100* 93  BUN 17 17  CREATININE 0.68 0.64  CALCIUM 8.3* 8.4*  PROT 6.0*   --   ALBUMIN 3.3*  --   AST 18  --   ALT 15  --   ALKPHOS 33*  --   BILITOT 0.5  --   GFRNONAA >60 >60  ANIONGAP 8 11     Hematology Recent Labs  Lab 11/03/22 1015 11/04/22 0447  WBC 5.0 5.1  RBC 3.87 3.82*  HGB 12.5 12.5  HCT 37.0 38.8  MCV 95.6 101.6*  MCH 32.3 32.7  MCHC 33.8 32.2  RDW 11.5 11.7  PLT 249 223    BNPNo results for input(s): "BNP", "PROBNP" in the last 168 hours.   DDimer  Recent Labs  Lab 11/03/22 1528  DDIMER 0.43    Radiology    CT PELVIS WO CONTRAST  Result Date: 11/03/2022 CLINICAL DATA:  Pain after fall EXAM: CT PELVIS WITHOUT CONTRAST TECHNIQUE: Multidetector CT imaging of the pelvis was performed following the standard protocol without intravenous contrast. RADIATION DOSE REDUCTION: This exam was performed according to the departmental dose-optimization program which includes automated exposure control, adjustment of the mA and/or kV according to patient size  and/or use of iterative reconstruction technique. COMPARISON:  None Available. FINDINGS: Urinary Tract:  Preserved contours of the urinary bladder Bowel: There is moderate stool in the rectosigmoid colon. The visualized small and large bowel in the pelvis is nondilated. There are some sigmoid colon diverticula. Vascular/Lymphatic: Scattered vascular calcifications along the visualized aorta and iliac vessels. No specific abnormal lymph node enlargement identified in the pelvis Reproductive:  Uterus is not seen.  No separate adnexal mass. Other: No free fluid in the pelvis. Small fat containing inguinal hernias Musculoskeletal: Osteopenia. No fracture or dislocation. Slight degenerative changes along the sacroiliac joints. Degenerative changes seen of the lower lumbar spine. Slight joint space loss of the hip joints. Grossly normal course and caliber to the sacrum and coccyx. 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 No clear fracture or dislocation. Electronically Signed   By: Karen Kays M.D.   On: 11/03/2022 11:51   CT Head Wo Contrast  Result Date: 11/03/2022 CLINICAL DATA:  Head trauma, moderate-severe Fall; Neck trauma (Age >= 65y) Fall EXAM: CT HEAD WITHOUT CONTRAST CT CERVICAL SPINE WITHOUT CONTRAST TECHNIQUE: Multidetector CT imaging of the head and cervical spine was performed following the standard protocol without intravenous contrast. Multiplanar CT image reconstructions of the cervical spine were also generated. RADIATION DOSE REDUCTION: This exam was performed according to the departmental dose-optimization program which includes automated exposure control, adjustment of the mA and/or kV according to patient size and/or use of iterative reconstruction technique. COMPARISON:  None Available. FINDINGS: CT HEAD FINDINGS Brain: No evidence of acute infarction, hemorrhage, hydrocephalus, extra-axial collection or mass lesion/mass effect. Partially empty sella Vascular: No hyperdense vessel or unexpected calcification. Skull: Normal. Negative for fracture or focal lesion. Sinuses/Orbits: No middle ear or mastoid effusion. Paranasal sinuses clear. Bilateral lens replacement. Orbits are otherwise unremarkable. Other: None. CT CERVICAL SPINE FINDINGS Alignment: Normal. Skull base and vertebrae: No acute fracture. No primary bone lesion or focal pathologic process. Soft tissues and spinal canal: No prevertebral fluid or swelling. No visible canal hematoma. Disc levels:  No evidence of high-grade spinal canal stenosis. Upper chest: Negative. Other: None IMPRESSION: 1. No acute intracranial abnormality. 2. No acute fracture or traumatic malalignment of the cervical spine. Electronically Signed   By: Lorenza Cambridge M.D.   On: 11/03/2022 11:39   CT Cervical Spine Wo Contrast  Result Date: 11/03/2022 CLINICAL DATA:  Head trauma, moderate-severe Fall; Neck trauma (Age >= 65y) Fall EXAM: CT HEAD  WITHOUT CONTRAST CT CERVICAL SPINE WITHOUT CONTRAST TECHNIQUE: Multidetector CT imaging of the head and cervical spine was performed following the standard protocol without intravenous contrast. Multiplanar CT image reconstructions of the cervical spine were also generated. RADIATION DOSE REDUCTION: This exam was performed according to the departmental dose-optimization program which includes automated exposure control, adjustment of the mA and/or kV according to patient size and/or use of iterative reconstruction technique. COMPARISON:  None Available. FINDINGS: CT HEAD FINDINGS Brain: No evidence of acute infarction, hemorrhage, hydrocephalus, extra-axial collection or mass lesion/mass effect. Partially empty sella Vascular: No hyperdense vessel or unexpected calcification. Skull: Normal. Negative for fracture or focal lesion. Sinuses/Orbits: No middle ear or mastoid effusion. Paranasal sinuses clear. Bilateral lens replacement. Orbits are otherwise unremarkable. Other: None. CT CERVICAL SPINE FINDINGS Alignment: Normal. Skull base and vertebrae: No acute fracture. No primary bone lesion or focal pathologic process. Soft tissues and spinal canal: No prevertebral fluid or swelling. No visible canal hematoma.  Disc levels:  No evidence of high-grade spinal canal stenosis. Upper chest: Negative. Other: None IMPRESSION: 1. No acute intracranial abnormality. 2. No acute fracture or traumatic malalignment of the cervical spine. Electronically Signed   By: Lorenza Cambridge M.D.   On: 11/03/2022 11:39   DG Chest 2 View  Result Date: 11/03/2022 CLINICAL DATA:  Seen CP.  Fall. EXAM: CHEST - 2 VIEW COMPARISON:  Chest x-ray 05/25/2019 FINDINGS: Hyperinflation. Apical pleural thickening. No consolidation, pneumothorax or effusion. No edema. Overlapping cardiac leads. Calcified aorta. There is dense right upper lung nodule, unchanged from previous and likely calcified granuloma. If there is further concern of the sequela of  trauma, a contrast CT could be considered as clinically appropriate for further delineation of injury. IMPRESSION: Hyperinflation.  No acute cardiopulmonary disease. Electronically Signed   By: Karen Kays M.D.   On: 11/03/2022 11:14    Patient Profile     82 y.o. female  with a hx of hypertension, mild dementia, GERD, CAD diagnosed in 2001, hyperlipidemia who is being seen today for the evaluation of bradycardia and syncope at the request of Mir Kirby Crigler, MD.   Assessment & Plan    Sinus bradycardia/syncope -She tells me that her heart rates are always in the 60s at home when she measures her blood pressure but her BP has been running higher -Got up morning feeling shaky and diaphoretic and while fixing breakfast all of a sudden became nauseated and had syncope falling to the ground hitting her bottom.  She was confused when she awakened but there was no seizure activity and the event was witnessed by her husband. -Found to be bradycardic in the 40s by EMS and since arrival in the ER heart rates of in the 50s. -Symptoms are concerning for a bradycardic event resulting in syncope or a combination of orthostatic hypotension along with chronotropic incompetence in the setting of bradycardia.  She has been having problems recently with dizziness when she is standing up too long or going from sitting to standing. -She has been on timolol eyedrops which has been stopped -I got a message from her PCP yesterday stating the she was started on Aricept by her Neurologist and he has seen bradycardia from this and told her and her family to stop this medication but they have refused.  Will discuss with [facility] about stopping this -2D echo pending -ddimer normal -orthostatic BPs normal -She is not on any SA nodal or AV nodal blocking agents except the timolol eye gtts which have been stopped and the ? About Aricept possibly causing bradycardia which I will alert [facility] to discuss with pharmacy whether it should be  stopped -HR in the 50-60's.  Continue to monitor on tele -recommend outpt 2 week ziopatch to assess HR response with normal activity at home   2.  Hypertension -BP is mildly elevated on exam this am but normal yesterday -diuretic stopped yesterday due to sx of orthostatic hypotension -also stopped Amlodipine in rare event it was contributing to her low HR -continue Irbesartan 300mg  daily -continue to follow BP      For questions or updates, please contact [facility] Please consult [email or web address] for contact info under        Signed, Armanda Magic, MD  11/04/2022, 8:25 AM

## 2022-11-04 NOTE — Plan of Care (Signed)

## 2022-11-04 NOTE — Progress Notes (Signed)
   11/04/22 1420  TOC Brief Assessment  Insurance and Status Reviewed  Patient has primary care physician Yes  Home environment has been reviewed ILF at [facility]  Prior level of function: independent  Prior/Current Home Services No current home services  Social Determinants of Health Reivew SDOH reviewed no interventions necessary  Readmission risk has been reviewed Yes  Transition of care needs no transition of care needs at this time

## 2022-11-04 NOTE — Discharge Instructions (Signed)
Sandra Wheeler,  You are in the hospital because of a syncopal episode where he passed out.  He also had some slow heart rates which was concerning for a possible reason for you passing out.  Your medications have been adjusted to minimize the chances of further slow heart rates.  Please discuss your medication changes with your primary care physician in addition to your neurologist as a relates to your Aricept for your memory.  The cardiologist will mail you a patch to monitor your heart rates and you will need to follow-up with them outpatient.

## 2022-11-04 NOTE — Progress Notes (Signed)
Ordered 2 week Zio monitor for further evaluation of syncope and bradycardia at the request of Dr. Mayford Knife. Please see Dr. Norris Cross rounding note for additional information.  Corrin Parker, PA-C 11/04/2022 8:27 PM

## 2022-11-04 NOTE — Evaluation (Signed)
Physical Therapy Evaluation Patient Details Name: Sandra Wheeler MRN: [id number] DOB: Aug 12, 1940 Today's Date: 11/04/2022  History of Present Illness  82 y.o. female with medical history significant for hypertension, IBS, mild dementia, GERD who is being admitted to the hospital with syncope.  Clinical Impression  Pt is mobilizing well at an independent level. She ambulated 180' without an assistive device, without loss of balance. Pt denied dizziness/lightheadedness with position changes and with head turns/head tilts. Noted orthostatics were negative yesterday. Pt/spouse stated the dizziness may be related to a medication pt was taking for her eyes which has been discontinued. No further PT indicated, she is ready to DC home from a PT standpoint. PT signing off.          If plan is discharge home, recommend the following:     Can travel by private vehicle        Equipment Recommendations None recommended by PT  Recommendations for Other Services       Functional Status Assessment Patient has not had a recent decline in their functional status     Precautions / Restrictions Precautions Precautions: Fall Precaution Comments: tripped at beauty shop about 1-2 months ago no injury; syncopal event just prior to admission, no other falls in past 6 months Restrictions Weight Bearing Restrictions: No      Mobility  Bed Mobility Overal bed mobility: Independent                  Transfers Overall transfer level: Independent                      Ambulation/Gait Ambulation/Gait assistance: Independent Gait Distance (Feet): 180 Feet Assistive device: None Gait Pattern/deviations: WFL(Within Functional Limits) Gait velocity: WNL     General Gait Details: steady, no loss of balance with head turns nor with looking up/down; pt denied dizziness throughout session  Stairs            Wheelchair Mobility     Tilt Bed    Modified Rankin (Stroke Patients  Only)       Balance Overall balance assessment: Independent                                           Pertinent Vitals/Pain Pain Assessment Pain Assessment: Faces Pain Score: 7  Pain Location: sacrum Pain Descriptors / Indicators: Sore Pain Intervention(s): Limited activity within patient's tolerance, Monitored during session, Patient requesting pain meds-RN notified, RN gave pain meds during session    Home Living Family/patient expects to be discharged to:: Private residence Living Arrangements: Spouse/significant other Available Help at Discharge: Family;Available 24 hours/day Type of Home: Independent living facility Home Access: Level entry       Home Layout: One level Home Equipment: Shower seat - built in      Prior Function Prior Level of Function : Independent/Modified Independent             Mobility Comments: walks without AD ADLs Comments: independent     Extremity/Trunk Assessment   Upper Extremity Assessment Upper Extremity Assessment: Defer to OT evaluation    Lower Extremity Assessment Lower Extremity Assessment: Overall WFL for tasks assessed    Cervical / Trunk Assessment Cervical / Trunk Assessment: Normal  Communication   Communication Communication: No apparent difficulties  Cognition Arousal: Alert Behavior During Therapy: WFL for tasks assessed/performed Overall Cognitive Status: Within  Functional Limits for tasks assessed                                          General Comments      Exercises     Assessment/Plan    PT Assessment Patient does not need any further PT services  PT Problem List         PT Treatment Interventions      PT Goals (Current goals can be found in the Care Plan section)  Acute Rehab PT Goals PT Goal Formulation: All assessment and education complete, DC therapy    Frequency       Co-evaluation PT/OT/SLP Co-Evaluation/Treatment: Yes Reason for  Co-Treatment: For patient/therapist safety PT goals addressed during session: Mobility/safety with mobility;Balance         AM-PAC PT "6 Clicks" Mobility  Outcome Measure Help needed turning from your back to your side while in a flat bed without using bedrails?: None Help needed moving from lying on your back to sitting on the side of a flat bed without using bedrails?: None Help needed moving to and from a bed to a chair (including a wheelchair)?: None Help needed standing up from a chair using your arms (e.g., wheelchair or bedside chair)?: None Help needed to walk in hospital room?: None Help needed climbing 3-5 steps with a railing? : None 6 Click Score: 24    End of Session Equipment Utilized During Treatment: Gait belt Activity Tolerance: Patient tolerated treatment well Patient left: in chair;with call bell/phone within reach;with family/visitor present Nurse Communication: Mobility status      Time: 6578-4696 PT Time Calculation (min) (ACUTE ONLY): 25 min   Charges:   PT Evaluation $PT Eval Low Complexity: 1 Low   PT General Charges $$ ACUTE PT VISIT: 1 Visit         Tamala Ser PT 11/04/2022  Acute Rehabilitation Services  Office [phone]

## 2022-11-07 ENCOUNTER — Ambulatory Visit: Payer: Medicare Other | Attending: Cardiology

## 2022-11-07 DIAGNOSIS — R55 Syncope and collapse: Secondary | ICD-10-CM

## 2022-11-07 DIAGNOSIS — R001 Bradycardia, unspecified: Secondary | ICD-10-CM

## 2022-11-07 NOTE — Progress Notes (Unsigned)
 Enrolled patient for a 14 day Zio XT monitor to be mailed to patients home  [place] to read

## 2022-11-10 DIAGNOSIS — R55 Syncope and collapse: Secondary | ICD-10-CM

## 2022-11-10 DIAGNOSIS — R001 Bradycardia, unspecified: Secondary | ICD-10-CM | POA: Diagnosis not present

## 2022-11-15 ENCOUNTER — Telehealth (HOSPITAL_BASED_OUTPATIENT_CLINIC_OR_DEPARTMENT_OTHER): Payer: Self-pay

## 2022-11-15 NOTE — Telephone Encounter (Signed)
The following are copied from a mychart schedule request :  Patient: My wife continues to have all the symptoms she had before going to the hospital - dizziness, nausea, tiredness. she rests 4-5 hours a day. This morning she felt light headed and thought she might black our again. She is wearing a heart monitor until 11/27/22.   Staff: Hi there,    It looks like she is scheduled for an appointment on 9/13 already. This will hopefully allow [place] to have her heart monitor results.    Do you have any recent blood pressures for her?    Best, Marlene Lard, RN  Patient: 7/27 -152/70 7/20 - 161/55 7/16 - 144/64 7/2 - 132/81 6/26 - 146/66  Staff: Are these taken before or after medications? If after how long after?   Patient: Normally after. Meds taken around 700 am, BP taken around 10:30 am   Will route to provider for review.

## 2022-11-15 NOTE — Telephone Encounter (Signed)
Her ZIO is an XT so low suspicion we will have results by 12/02/22 if she is wearing until 11/27/22.   Do they recall which day she put on the monitor? Looks like it was delivered 11/09/22. If so they would take it off 11/23/22. Ideally would have 14 days of data but if they want to take it off after 10 days, that would be permissible to to have results sooner.   Would expect low BP to contribute to dizziness, not high BP. If they have any heart rate readings from home that would help. Ensure not taking Aricept, Amlodipine, Timolol eye drops as these were discontinued in the hospital.   Alver Sorrow, NP

## 2022-11-16 NOTE — Telephone Encounter (Signed)
Follow up to your message

## 2022-11-17 NOTE — Telephone Encounter (Signed)
Recommend stopping Amlodipine, Aricept as recommended at hospital discharge. Amlodipine (in rare instance) could contribute to low heart rate and Aricept can also cause low heart rate. Remain off Timolol.   Alver Sorrow, NP

## 2022-11-29 ENCOUNTER — Encounter (HOSPITAL_BASED_OUTPATIENT_CLINIC_OR_DEPARTMENT_OTHER): Payer: Self-pay

## 2022-11-30 ENCOUNTER — Non-Acute Institutional Stay: Payer: Medicare Other | Admitting: Internal Medicine

## 2022-11-30 ENCOUNTER — Encounter: Payer: Self-pay | Admitting: Internal Medicine

## 2022-11-30 ENCOUNTER — Encounter: Payer: Medicare Other | Admitting: Internal Medicine

## 2022-11-30 VITALS — BP 130/66 | HR 79 | Temp 97.8°F | Resp 17 | Ht 62.0 in | Wt 134.4 lb

## 2022-11-30 DIAGNOSIS — R42 Dizziness and giddiness: Secondary | ICD-10-CM

## 2022-11-30 DIAGNOSIS — I10 Essential (primary) hypertension: Secondary | ICD-10-CM

## 2022-11-30 DIAGNOSIS — R110 Nausea: Secondary | ICD-10-CM

## 2022-11-30 DIAGNOSIS — K219 Gastro-esophageal reflux disease without esophagitis: Secondary | ICD-10-CM

## 2022-11-30 DIAGNOSIS — F03A0 Unspecified dementia, mild, without behavioral disturbance, psychotic disturbance, mood disturbance, and anxiety: Secondary | ICD-10-CM

## 2022-11-30 DIAGNOSIS — E7800 Pure hypercholesterolemia, unspecified: Secondary | ICD-10-CM

## 2022-11-30 MED ORDER — OMEPRAZOLE 40 MG PO CPDR: 40.0000 mg | DELAYED_RELEASE_CAPSULE | Freq: Every day | ORAL | 3 refills | Status: DC

## 2022-11-30 NOTE — Progress Notes (Addendum)
[facility] Service:  Clinic (12)  Provider:   Code Status:  Goals of Care:     11/30/2022    8:26 AM  Advanced Directives  Does Patient Have a Medical Advance Directive? Yes  Type of Estate agent of [place];Living will;Out of facility DNR (pink MOST or yellow form)  Does patient want to make changes to medical advance directive? No - Patient declined  Copy of Healthcare Power of Attorney in Chart? No - copy requested     Chief Complaint  Patient presents with   Acute Visit    Patient has had a fall, feels tired , dizzy , fatigue ad blacked out the other day    Immunizations    Discuss the need for covid and flu vaccine    Quality Metric Gaps    AWV needed     HPI: Patient is a 82 y.o. female seen today for an acute visit for Dizziness and Nausea  Patient was admitted in the hospital from 08/15-08/16 after episode of Syncope  Patient  has a history of hypertension, CAD, mild cognitive impairment, HLD, osteoporosis and Dizziness  Patient got up in the morning of 08/15 felt nauseated and shaky and then passed out Her HR was in 40-50 in ED She was taken off Norvasc, atenolol Aricept and Timolol Eye drops Also had Zio patch placed results are pending  She continues to feel dizzy since her discharge She feels dizzy especially when she gets up in the morning It does get better as day gets by  She also feels her memory has worsen since she has been off Aricept  Feels depressed and tired Sleeps all the time No Fever or any other signs of infection     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/25/2019   Cataract    unsure of which eye   Colon polyps    Environmental allergies    Essential hypertension 06/25/2019   Genetic testing 04/28/2017   Multi-Cancer panel (83 genes) @ [facility] - No pathogenic mutations  detected   GERD (gastroesophageal reflux disease)    Glaucoma    High cholesterol    Hypertension    IBS (irritable bowel syndrome)    Memory change    Osteoporosis 06/2016   T score -2.8   PONV (postoperative nausea and vomiting)    Pure hypercholesterolemia 06/25/2019   Seizures (HCC)    childhood, 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    Past Surgical History:  Procedure Laterality Date   ABDOMINAL HYSTERECTOMY     BLADDER SUSPENSION N/A 04/08/2016   Procedure: TRANSVAGINAL TAPE (TVT) PROCEDURE;  Surgeon: Genia Del, MD;  Location: [facility];  Service: Gynecology;  Laterality: N/A;   BREAST SURGERY     cyst removal   CARDIAC CATHETERIZATION     CATARACT EXTRACTION, BILATERAL     COLONOSCOPY     CYSTOSCOPY N/A 04/08/2016   Procedure: CYSTOSCOPY;  Surgeon: Genia Del, MD;  Location: [facility];  Service: Gynecology;  Laterality: N/A;   DENTAL SURGERY  03/31/2016   ELBOW FRACTURE SURGERY     EYE SURGERY Left    blood clot on retina   ROBOTIC ASSISTED TOTAL HYSTERECTOMY WITH BILATERAL SALPINGO OOPHERECTOMY Bilateral 04/08/2016   Procedure: ROBOTIC ASSISTED TOTAL HYSTERECTOMY WITH BILATERAL SALPINGO OOPHORECTOMY/Uterosacral Ligament Suspension;  Surgeon: Genia Del, MD;  Location: [facility];  Service: Gynecology;  Laterality: Bilateral;  Request 4 hrs.   TUBAL LIGATION     WRIST ARTHROSCOPY      Allergies  Allergen Reactions   Codeine Other (See Comments)    Knocked her out Other reaction(s): Unknown   Meloxicam Other (See Comments)    BLURRY VISION, DIZZY, CONFUSION  Other reaction(s): Unknown   Sulfamethoxazole-Trimethoprim     Other reaction(s): Unknown   Tramadol Other (See Comments)    CONFUSION     Outpatient Encounter Medications as of 11/30/2022  Medication Sig   alendronate (FOSAMAX) 70 MG tablet Take 1 tablet by mouth every 7 days. Take with a full glass of water on an empty stomach (Patient taking differently: Take 70 mg by mouth once a week.)    ezetimibe (ZETIA) 10 MG tablet Take 1 tablet (10 mg total) by mouth daily.   hydrochlorothiazide (MICROZIDE) 12.5 MG capsule Take 1 capsule (12.5 mg total) by mouth daily.   Latanoprost PF 0.005 % SOLN    Multiple Vitamin (MULTIVITAMIN WITH MINERALS) TABS tablet Take 1 tablet by mouth daily.   pravastatin (PRAVACHOL) 40 MG tablet Take 1 tablet (40 mg total) by mouth every evening.   valsartan (DIOVAN) 320 MG tablet Take 1 tablet (320 mg total) by mouth daily. Please schedule appointment with Dr. Duke Salvia for refills.   ondansetron (ZOFRAN-ODT) 4 MG disintegrating tablet Take 4 mg by mouth every 8 (eight) hours as needed for nausea or vomiting. (Patient not taking: Reported on 11/30/2022)   No facility-administered encounter medications on file as of 11/30/2022.    Review of Systems:  Review of Systems  Constitutional:  Negative for activity change and appetite change.  HENT: Negative.    Respiratory:  Negative for cough and shortness of breath.   Cardiovascular:  Negative for leg swelling.  Gastrointestinal:  Positive for nausea. Negative for constipation.  Genitourinary: Negative.   Musculoskeletal:  Positive for gait problem. Negative for arthralgias and myalgias.  Skin: Negative.   Neurological:  Positive for dizziness. Negative for weakness.  Psychiatric/Behavioral:  Positive for confusion and dysphoric mood. Negative for sleep disturbance.     Health Maintenance  Topic Date Due   Medicare Annual Wellness (AWV)  Never done   INFLUENZA VACCINE  10/20/2022   COVID-19 Vaccine (5 - 2023-24 season) 11/20/2022   DTaP/Tdap/Td (3 - Td or Tdap) 07/05/2029   Pneumonia Vaccine 61+ Years old  Completed   DEXA SCAN  Completed   Zoster Vaccines- Shingrix  Completed   HPV VACCINES  Aged Out    Physical Exam: Vitals:   11/30/22 0823  BP: 134/68  Pulse: 79  Resp: 17  Temp: 97.8 F (36.6 C)  TempSrc: Temporal  SpO2: 97%  Weight: 134 lb 6.4 oz (61 kg)  Height: 5\' 2"  (1.575 m)   Body  mass index is 24.58 kg/m. Physical Exam Vitals reviewed.  Constitutional:      Appearance: Normal appearance.  HENT:     Head: Normocephalic.     Nose: Nose normal.     Mouth/Throat:     Mouth: Mucous membranes are moist.     Pharynx: Oropharynx is clear.  Eyes:     Pupils: Pupils are equal, round, and reactive to light.  Cardiovascular:     Rate and Rhythm: Normal rate and regular rhythm.     Pulses: Normal pulses.     Heart sounds: Normal heart sounds. No murmur heard. Pulmonary:     Effort: Pulmonary effort is normal.  Breath sounds: Normal breath sounds.  Abdominal:     General: Abdomen is flat. Bowel sounds are normal.     Palpations: Abdomen is soft.  Musculoskeletal:        General: No swelling.     Cervical back: Neck supple.  Skin:    General: Skin is warm.  Neurological:     General: No focal deficit present.     Mental Status: She is alert and oriented to person, place, and time.     Comments: Has some issues with Word Findings  Psychiatric:        Mood and Affect: Mood normal.        Thought Content: Thought content normal.     Labs reviewed: Basic Metabolic Panel: Recent Labs    04/12/22 1043 06/16/22 0800 11/03/22 1015 11/03/22 1220 11/04/22 0447  NA 141 141 141  --  136  K 4.6 3.9 3.4*  --  4.0  CL 102 106 111  --  106  CO2 27 25 22   --  19*  GLUCOSE 93 102* 100*  --  93  BUN 16 20 17   --  17  CREATININE 0.86 0.70 0.68  --  0.64  CALCIUM 10.2 8.8 8.3*  --  8.4*  MG 2.3  --   --  2.0  --   TSH  --  5.42*  --  3.349  --    Liver Function Tests: Recent Labs    04/12/22 1043 06/16/22 0800 11/03/22 1015  AST 21 20 18   ALT 18 20 15   ALKPHOS 58  --  33*  BILITOT 0.3 0.4 0.5  PROT 6.9 6.5 6.0*  ALBUMIN 4.5  --  3.3*   No results for input(s): "LIPASE", "AMYLASE" in the last 8760 hours. No results for input(s): "AMMONIA" in the last 8760 hours. CBC: Recent Labs    06/16/22 0800 11/03/22 1015 11/04/22 0447  WBC 4.5 5.0 5.1   NEUTROABS 2,916 3.4  --   HGB 12.9 12.5 12.5  HCT 37.9 37.0 38.8  MCV 95.0 95.6 101.6*  PLT 277 249 223   Lipid Panel: Recent Labs    04/12/22 1043 06/16/22 0800  CHOL 218* 150  HDL 57 52  LDLCALC 131* 75  TRIG 170* 157*  CHOLHDL 3.8 2.9  LDLDIRECT 145*  --    No results found for: "HGBA1C"  Procedures since last visit: ECHOCARDIOGRAM COMPLETE  Result Date: 11/04/2022    ECHOCARDIOGRAM REPORT   Patient Name:   AMERICUS HERSON Date of Exam: 11/04/2022 Medical Rec #:  [id number]      Height:       62.0 in Accession #:    [id number]     Weight:       140.0 lb Date of Birth:  1941-03-17      BSA:          1.643 m Patient Age:    82 years       BP:           154/55 mmHg Patient Gender: F              HR:           49 bpm. Exam Location:  Inpatient Procedure: 2D Echo, Color Doppler and Cardiac Doppler Indications:    Syncope  History:        Patient has no prior history of Echocardiogram examinations.  CAD, Signs/Symptoms:Syncope; Risk Factors:Dyslipidemia and                 Hypertension.  Sonographer:    [facility] Referring Phys: [id number] MIR M [facility] IMPRESSIONS  1. Left ventricular ejection fraction, by estimation, is 60 to 65%. The left ventricle has normal function. The left ventricle has no regional wall motion abnormalities. Left ventricular diastolic parameters are indeterminate.  2. Right ventricular systolic function is normal. The right ventricular size is normal.  3. The mitral valve is normal in structure. Trivial mitral valve regurgitation.  4. The aortic valve is normal in structure. Aortic valve regurgitation is not visualized. FINDINGS  Left Ventricle: Left ventricular ejection fraction, by estimation, is 60 to 65%. The left ventricle has normal function. The left ventricle has no regional wall motion abnormalities. The left ventricular internal cavity size was normal in size. There is  no left ventricular hypertrophy. Left ventricular diastolic parameters are  indeterminate. Right Ventricle: The right ventricular size is normal. Right vetricular wall thickness was not assessed. Right ventricular systolic function is normal. Left Atrium: Left atrial size was normal in size. Right Atrium: Right atrial size was normal in size. Pericardium: There is no evidence of pericardial effusion. Mitral Valve: The mitral valve is normal in structure. Trivial mitral valve regurgitation. Tricuspid Valve: The tricuspid valve is normal in structure. Tricuspid valve regurgitation is trivial. Aortic Valve: The aortic valve is normal in structure. Aortic valve regurgitation is not visualized. Aortic valve mean gradient measures 4.0 mmHg. Aortic valve peak gradient measures 7.4 mmHg. Aortic valve area, by VTI measures 1.66 cm. Pulmonic Valve: The pulmonic valve was not well visualized. Pulmonic valve regurgitation is not visualized. No evidence of pulmonic stenosis. Aorta: The aortic root is normal in size and structure. IAS/Shunts: No atrial level shunt detected by color flow Doppler.  LEFT VENTRICLE PLAX 2D LVIDd:         3.10 cm   Diastology LVIDs:         2.30 cm   LV e' medial:    7.51 cm/s LV PW:         1.00 cm   LV E/e' medial:  13.7 LV IVS:        1.10 cm   LV e' lateral:   6.85 cm/s LVOT diam:     1.80 cm   LV E/e' lateral: 15.0 LV SV:         59 LV SV Index:   36 LVOT Area:     2.54 cm  RIGHT VENTRICLE RV Basal diam:  3.00 cm RV Mid diam:    2.00 cm RV S prime:     7.94 cm/s TAPSE (M-mode): 2.0 cm LEFT ATRIUM             Index        RIGHT ATRIUM          Index LA diam:        3.50 cm 2.13 cm/m   RA Area:     8.61 cm LA Vol (A2C):   42.6 ml 25.93 ml/m  RA Volume:   15.70 ml 9.56 ml/m LA Vol (A4C):   29.5 ml 17.96 ml/m LA Biplane Vol: 37.0 ml 22.52 ml/m  AORTIC VALVE AV Area (Vmax):    1.66 cm AV Area (Vmean):   1.65 cm AV Area (VTI):     1.66 cm AV Vmax:           136.00 cm/s AV Vmean:  92.300 cm/s AV VTI:            0.353 m AV Peak Grad:      7.4 mmHg AV Mean  Grad:      4.0 mmHg LVOT Vmax:         88.70 cm/s LVOT Vmean:        60.000 cm/s LVOT VTI:          0.230 m LVOT/AV VTI ratio: 0.65  AORTA Ao Root diam: 2.70 cm MITRAL VALVE                TRICUSPID VALVE MV Area (PHT): 3.60 cm     TR Peak grad:   21.5 mmHg MV Decel Time: 211 msec     TR Vmax:        232.00 cm/s MV E velocity: 103.00 cm/s MV A velocity: 85.70 cm/s   SHUNTS MV E/A ratio:  1.20         Systemic VTI:  0.23 m                             Systemic Diam: 1.80 cm Dietrich Pates MD Electronically signed by Dietrich Pates MD Signature Date/Time: 11/04/2022/12:23:35 PM    Final    CT PELVIS WO CONTRAST  Result Date: 11/03/2022 CLINICAL DATA:  Pain after fall EXAM: CT PELVIS WITHOUT CONTRAST TECHNIQUE: Multidetector CT imaging of the pelvis was performed following the standard protocol without intravenous contrast. RADIATION DOSE REDUCTION: This exam was performed according to the departmental dose-optimization program which includes automated exposure control, adjustment of the mA and/or kV according to patient size and/or use of iterative reconstruction technique. COMPARISON:  None Available. FINDINGS: Urinary Tract:  Preserved contours of the urinary bladder Bowel: There is moderate stool in the rectosigmoid colon. The visualized small and large bowel in the pelvis is nondilated. There are some sigmoid colon diverticula. Vascular/Lymphatic: Scattered vascular calcifications along the visualized aorta and iliac vessels. No specific abnormal lymph node enlargement identified in the pelvis Reproductive:  Uterus is not seen.  No separate adnexal mass. Other: No free fluid in the pelvis. Small fat containing inguinal hernias Musculoskeletal: Osteopenia. No fracture or dislocation. Slight degenerative changes along the sacroiliac joints. Degenerative changes seen of the lower lumbar spine. Slight joint space loss of the hip joints. Grossly normal course and caliber to the sacrum and coccyx. 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 No clear fracture or dislocation. Electronically Signed   By: Karen Kays M.D.   On: 11/03/2022 11:51   CT Head Wo Contrast  Result Date: 11/03/2022 CLINICAL DATA:  Head trauma, moderate-severe Fall; Neck trauma (Age >= 65y) Fall EXAM: CT HEAD WITHOUT CONTRAST CT CERVICAL SPINE WITHOUT CONTRAST TECHNIQUE: Multidetector CT imaging of the head and cervical spine was performed following the standard protocol without intravenous contrast. Multiplanar CT image reconstructions of the cervical spine were also generated. RADIATION DOSE REDUCTION: This exam was performed according to the departmental dose-optimization program which includes automated exposure control, adjustment of the mA and/or kV according to patient size and/or use of iterative reconstruction technique. COMPARISON:  None Available. FINDINGS: CT HEAD FINDINGS Brain: No evidence of acute infarction, hemorrhage, hydrocephalus, extra-axial collection or mass lesion/mass effect. Partially empty sella Vascular: No hyperdense vessel or unexpected calcification. Skull: Normal. Negative for fracture or focal lesion. Sinuses/Orbits: No middle ear or mastoid effusion.  Paranasal sinuses clear. Bilateral lens replacement. Orbits are otherwise unremarkable. Other: None. CT CERVICAL SPINE FINDINGS Alignment: Normal. Skull base and vertebrae: No acute fracture. No primary bone lesion or focal pathologic process. Soft tissues and spinal canal: No prevertebral fluid or swelling. No visible canal hematoma. Disc levels:  No evidence of high-grade spinal canal stenosis. Upper chest: Negative. Other: None IMPRESSION: 1. No acute intracranial abnormality. 2. No acute fracture or traumatic malalignment of the cervical spine. Electronically Signed   By: Lorenza Cambridge M.D.   On: 11/03/2022 11:39   CT Cervical Spine Wo Contrast  Result Date:  11/03/2022 CLINICAL DATA:  Head trauma, moderate-severe Fall; Neck trauma (Age >= 65y) Fall EXAM: CT HEAD WITHOUT CONTRAST CT CERVICAL SPINE WITHOUT CONTRAST TECHNIQUE: Multidetector CT imaging of the head and cervical spine was performed following the standard protocol without intravenous contrast. Multiplanar CT image reconstructions of the cervical spine were also generated. RADIATION DOSE REDUCTION: This exam was performed according to the departmental dose-optimization program which includes automated exposure control, adjustment of the mA and/or kV according to patient size and/or use of iterative reconstruction technique. COMPARISON:  None Available. FINDINGS: CT HEAD FINDINGS Brain: No evidence of acute infarction, hemorrhage, hydrocephalus, extra-axial collection or mass lesion/mass effect. Partially empty sella Vascular: No hyperdense vessel or unexpected calcification. Skull: Normal. Negative for fracture or focal lesion. Sinuses/Orbits: No middle ear or mastoid effusion. Paranasal sinuses clear. Bilateral lens replacement. Orbits are otherwise unremarkable. Other: None. CT CERVICAL SPINE FINDINGS Alignment: Normal. Skull base and vertebrae: No acute fracture. No primary bone lesion or focal pathologic process. Soft tissues and spinal canal: No prevertebral fluid or swelling. No visible canal hematoma. Disc levels:  No evidence of high-grade spinal canal stenosis. Upper chest: Negative. Other: None IMPRESSION: 1. No acute intracranial abnormality. 2. No acute fracture or traumatic malalignment of the cervical spine. Electronically Signed   By: Lorenza Cambridge M.D.   On: 11/03/2022 11:39   DG Chest 2 View  Result Date: 11/03/2022 CLINICAL DATA:  Seen CP.  Fall. EXAM: CHEST - 2 VIEW COMPARISON:  Chest x-ray 05/25/2019 FINDINGS: Hyperinflation. Apical pleural thickening. No consolidation, pneumothorax or effusion. No edema. Overlapping cardiac leads. Calcified aorta. There is dense right upper lung nodule,  unchanged from previous and likely calcified granuloma. If there is further concern of the sequela of trauma, a contrast CT could be considered as clinically appropriate for further delineation of injury. IMPRESSION: Hyperinflation.  No acute cardiopulmonary disease. Electronically Signed   By: Karen Kays M.D.   On: 11/03/2022 11:14    Assessment/Plan 1. Dizziness Continues to be her issue She c/o feeling unsteady in the morning when she gets up Was not orthostatics here MRI negative in 09/23 Repeat CT in the ED was negative Waiting for Heart monitor results  2. Essential hypertension Now only on valsartan and hydrochlorothiazide BP checked by Facility Nurse have been in range from 150-134 Systolic  3. Nausea Will try Prilosec to see if it helps  She has seen Dr Loreta Ave before will refer her back if nausea doe snot improve She has lost some weight recently also  4. Mild dementia,  Per patient her memory is getting worse off Aricept We discussed how we cannot try another med till she her Heart monitor results come back 5. Pure hypercholesterolemia On Statin and Zetia  6. Gastroesophageal reflux disease without esophagitis Will Try Prilosec QD 7 Osteoporosis without current pathological fracture, unspecified osteoporosis type Just started back on Fosamax Managed with GYN  We discussed how Fosamax can also cause nausea but will not do anything yet   Labs/tests ordered:  * No order type specified * Next appt:  12/21/2022

## 2022-12-02 ENCOUNTER — Encounter: Payer: Self-pay | Admitting: Neurology

## 2022-12-02 ENCOUNTER — Ambulatory Visit (HOSPITAL_BASED_OUTPATIENT_CLINIC_OR_DEPARTMENT_OTHER): Payer: Medicare Other | Admitting: Family

## 2022-12-02 ENCOUNTER — Encounter (HOSPITAL_BASED_OUTPATIENT_CLINIC_OR_DEPARTMENT_OTHER): Payer: Self-pay

## 2022-12-05 ENCOUNTER — Encounter: Payer: Self-pay | Admitting: Internal Medicine

## 2022-12-06 LAB — HM MAMMOGRAPHY

## 2022-12-07 ENCOUNTER — Encounter (HOSPITAL_COMMUNITY): Payer: Self-pay

## 2022-12-07 ENCOUNTER — Emergency Department (HOSPITAL_COMMUNITY): Payer: Medicare Other

## 2022-12-07 ENCOUNTER — Inpatient Hospital Stay (HOSPITAL_COMMUNITY)
Admission: EM | Admit: 2022-12-07 | Discharge: 2022-12-09 | DRG: 322 | Disposition: A | Discharge status: Home / Self Care | Payer: Medicare Other | Attending: Cardiology | Admitting: Cardiology

## 2022-12-07 ENCOUNTER — Other Ambulatory Visit: Payer: Self-pay

## 2022-12-07 DIAGNOSIS — H409 Unspecified glaucoma: Secondary | ICD-10-CM | POA: Diagnosis present

## 2022-12-07 DIAGNOSIS — Z833 Family history of diabetes mellitus: Secondary | ICD-10-CM

## 2022-12-07 DIAGNOSIS — K219 Gastro-esophageal reflux disease without esophagitis: Secondary | ICD-10-CM | POA: Diagnosis present

## 2022-12-07 DIAGNOSIS — Z79899 Other long term (current) drug therapy: Secondary | ICD-10-CM

## 2022-12-07 DIAGNOSIS — Z955 Presence of coronary angioplasty implant and graft: Secondary | ICD-10-CM

## 2022-12-07 DIAGNOSIS — Z882 Allergy status to sulfonamides status: Secondary | ICD-10-CM | POA: Diagnosis not present

## 2022-12-07 DIAGNOSIS — Z7983 Long term (current) use of bisphosphonates: Secondary | ICD-10-CM

## 2022-12-07 DIAGNOSIS — Z8249 Family history of ischemic heart disease and other diseases of the circulatory system: Secondary | ICD-10-CM | POA: Diagnosis not present

## 2022-12-07 DIAGNOSIS — I10 Essential (primary) hypertension: Secondary | ICD-10-CM | POA: Diagnosis present

## 2022-12-07 DIAGNOSIS — I214 Non-ST elevation (NSTEMI) myocardial infarction: Secondary | ICD-10-CM | POA: Diagnosis not present

## 2022-12-07 DIAGNOSIS — I2510 Atherosclerotic heart disease of native coronary artery without angina pectoris: Secondary | ICD-10-CM | POA: Diagnosis present

## 2022-12-07 DIAGNOSIS — M810 Age-related osteoporosis without current pathological fracture: Secondary | ICD-10-CM | POA: Diagnosis present

## 2022-12-07 DIAGNOSIS — F0390 Unspecified dementia without behavioral disturbance: Secondary | ICD-10-CM | POA: Diagnosis present

## 2022-12-07 DIAGNOSIS — Z888 Allergy status to other drugs, medicaments and biological substances status: Secondary | ICD-10-CM | POA: Diagnosis not present

## 2022-12-07 DIAGNOSIS — E7800 Pure hypercholesterolemia, unspecified: Secondary | ICD-10-CM | POA: Diagnosis present

## 2022-12-07 DIAGNOSIS — Z822 Family history of deafness and hearing loss: Secondary | ICD-10-CM

## 2022-12-07 DIAGNOSIS — Z87891 Personal history of nicotine dependence: Secondary | ICD-10-CM

## 2022-12-07 DIAGNOSIS — E785 Hyperlipidemia, unspecified: Secondary | ICD-10-CM

## 2022-12-07 DIAGNOSIS — I25118 Atherosclerotic heart disease of native coronary artery with other forms of angina pectoris: Secondary | ICD-10-CM

## 2022-12-07 DIAGNOSIS — R252 Cramp and spasm: Secondary | ICD-10-CM

## 2022-12-07 DIAGNOSIS — Z885 Allergy status to narcotic agent status: Secondary | ICD-10-CM

## 2022-12-07 LAB — CBC WITH DIFFERENTIAL/PLATELET
Abs Immature Granulocytes: 0.03 10*3/uL (ref 0.00–0.07)
Basophils Absolute: 0 10*3/uL (ref 0.0–0.1)
Basophils Relative: 0 %
Eosinophils Absolute: 0 10*3/uL (ref 0.0–0.5)
Eosinophils Relative: 1 %
HCT: 37.7 % (ref 36.0–46.0)
Hemoglobin: 12.5 g/dL (ref 12.0–15.0)
Immature Granulocytes: 0 %
Lymphocytes Relative: 17 %
Lymphs Abs: 1.5 10*3/uL (ref 0.7–4.0)
MCH: 32.2 pg (ref 26.0–34.0)
MCHC: 33.2 g/dL (ref 30.0–36.0)
MCV: 97.2 fL (ref 80.0–100.0)
Monocytes Absolute: 0.5 10*3/uL (ref 0.1–1.0)
Monocytes Relative: 6 %
Neutro Abs: 6.4 10*3/uL (ref 1.7–7.7)
Neutrophils Relative %: 76 %
Platelets: 288 10*3/uL (ref 150–400)
RBC: 3.88 MIL/uL (ref 3.87–5.11)
RDW: 11.6 % (ref 11.5–15.5)
WBC: 8.5 10*3/uL (ref 4.0–10.5)
nRBC: 0 % (ref 0.0–0.2)

## 2022-12-07 LAB — COMPREHENSIVE METABOLIC PANEL WITH GFR
ALT: 16 U/L (ref 0–44)
AST: 27 U/L (ref 15–41)
Albumin: 3.2 g/dL — ABNORMAL LOW (ref 3.5–5.0)
Alkaline Phosphatase: 41 U/L (ref 38–126)
Anion gap: 9 (ref 5–15)
BUN: 20 mg/dL (ref 8–23)
CO2: 20 mmol/L — ABNORMAL LOW (ref 22–32)
Calcium: 8.3 mg/dL — ABNORMAL LOW (ref 8.9–10.3)
Chloride: 108 mmol/L (ref 98–111)
Creatinine, Ser: 0.75 mg/dL (ref 0.44–1.00)
GFR, Estimated: >60 mL/min (ref >60)
Glucose, Bld: 112 mg/dL — ABNORMAL HIGH (ref 70–99)
Potassium: 3.8 mmol/L (ref 3.5–5.1)
Sodium: 137 mmol/L (ref 135–145)
Total Bilirubin: 0.5 mg/dL (ref 0.3–1.2)
Total Protein: 6.1 g/dL — ABNORMAL LOW (ref 6.5–8.1)

## 2022-12-07 LAB — LIPASE, BLOOD: Lipase: 47 U/L (ref 11–51)

## 2022-12-07 LAB — TROPONIN I (HIGH SENSITIVITY): Troponin I (High Sensitivity): 2475 ng/L (ref <18)

## 2022-12-07 MED ORDER — HEPARIN (PORCINE) 25000 UT/250ML-% IV SOLN: 12.0000 [IU]/kg/h | INTRAVENOUS | Status: DC

## 2022-12-07 MED ORDER — HEPARIN BOLUS VIA INFUSION
3600.0000 [IU] | Freq: Once | INTRAVENOUS | Status: AC
  Administered 2022-12-08: 3600 [IU] via INTRAVENOUS
  Filled 2022-12-07: qty 3600

## 2022-12-07 MED ORDER — ASPIRIN 81 MG PO CHEW: 324.0000 mg | CHEWABLE_TABLET | Freq: Once | ORAL | Status: DC

## 2022-12-07 MED ORDER — HEPARIN (PORCINE) 25000 UT/250ML-% IV SOLN
700.0000 [IU]/h | INTRAVENOUS | Status: DC
  Administered 2022-12-08: 700 [IU]/h via INTRAVENOUS
  Filled 2022-12-07: qty 250

## 2022-12-07 MED ORDER — ASPIRIN 81 MG PO CHEW
324.0000 mg | CHEWABLE_TABLET | Freq: Once | ORAL | Status: AC
  Administered 2022-12-07: 324 mg via ORAL
  Filled 2022-12-07: qty 4

## 2022-12-07 NOTE — Progress Notes (Signed)
ANTICOAGULATION CONSULT NOTE - Initial Consult  Pharmacy Consult for heparin gtt Indication: chest pain/ACS  Allergies  Allergen Reactions   Codeine Other (See Comments)    Knocked her out Other reaction(s): Unknown   Meloxicam Other (See Comments)    BLURRY VISION, DIZZY, CONFUSION  Other reaction(s): Unknown   Sulfamethoxazole-Trimethoprim     Unknown reaction   Tramadol Other (See Comments)    CONFUSION     Patient Measurements: Height: 5\' 2"  (157.5 cm) Weight: 61 kg (134 lb 7.7 oz) IBW/kg (Calculated) : 50.1 Heparin Dosing Weight: 61 kg  Vital Signs: Temp: 98.3 F (36.8 C) (09/18 2306) Temp Source: Oral (09/18 2306) BP: 178/56 (09/18 2306) Pulse Rate: 60 (09/18 2306)  Labs: Recent Labs    12/07/22 2152  HGB 12.5  HCT 37.7  PLT 288  CREATININE 0.75  TROPONINIHS 2,475*    Estimated Creatinine Clearance: 46.6 mL/min (by C-G formula based on SCr of 0.75 mg/dL).   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/25/2019   Cataract    unsure of which eye   Colon polyps    Environmental allergies    Essential hypertension 06/25/2019   Genetic testing 04/28/2017   Multi-Cancer panel (83 genes) @ [facility] - No pathogenic mutations detected   GERD (gastroesophageal reflux disease)    Glaucoma    High cholesterol    Hypertension    IBS (irritable bowel syndrome)    Memory change    Osteoporosis 06/2016   T score -2.8   PONV (postoperative nausea and vomiting)    Pure hypercholesterolemia 06/25/2019   Seizures (HCC)    childhood, 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 Found to have elevated trop up to 2475. Pharmacy consulted for heparin drip in setting of NSTEMI. No anticoag prior to admission. CBC WNL    Goal of Therapy:  Heparin level 0.3-0.7 units/ml Monitor platelets by anticoagulation protocol:  Yes   Plan:  Heparin bolus 3600 units IV x1 followed by  heparin infusion at 700 units/hr 8hr HL at 0800  Daily HL, CBC thereafter F/u cardiology plans  Calton Dach, PharmD, BCCCP Clinical Pharmacist 12/07/2022 11:53 PM

## 2022-12-07 NOTE — ED Triage Notes (Signed)
Pt to the Ed from [facility] with a CC of chest pain x 1 days. Pt was recently Dced from hospital 2 weeks ago and husband relays pt become  A&Ox 1 with some aphasia after the DC. Pt has a history of dementia. Pt relays some dizziness. Denis sob, loc at this time.

## 2022-12-07 NOTE — ED Provider Notes (Signed)
EMERGENCY DEPARTMENT AT [facility] Provider Note   CSN: [id number] Arrival date & time: 12/07/22  1810     History  Chief Complaint  Patient presents with   Chest Pain    TERRY LITTREL is a 82 y.o. female.  Pt is an 82y/o female with hx of hypertension, CAD, mild cognitive impairment, HLD, osteoporosis and Dizziness who was hospitalized about 1 months ago after a syncopal event who is presenting today with c/o of dizziness.  Husband reports they d/c several meds while hospitalized but over the last 1 month pt has been doing worse.  She feels dizzy anytime she tries to get up so she is laying down most of the time.  Family states since stopping the aricept she has been becoming a lot more confused.  She hasn't had vomiting but every morning when she wakes up she goes to breakfast after finishing breakfast feels dizzy and has to go back and lay down.  She lays down sometimes 5 hours a day.  Today she c/o of chest pain like a pinch in the left side of her chest but denies sob and states the pain is gone now.  She does not feel dizzy with lying down.  She denies vision changes or unilateral leg pain or swelling.  They are still waiting on zio patch results.  No falls since d/c.  Family denies ataxia.  She has started to get occassional headaches but denies neck pain.  The history is provided by the patient, the spouse and medical records.  Chest Pain      Home Medications Prior to Admission medications   Medication Sig Start Date End Date Taking? Authorizing Provider  alendronate (FOSAMAX) 70 MG tablet Take 1 tablet by mouth every 7 days. Take with a full glass of water on an empty stomach Patient taking differently: Take 70 mg by mouth once a week. Sunday 06/20/22  Yes Genia Del, MD  calcium carbonate (CALCIUM 600) 600 MG TABS tablet Take 600 mg by mouth daily with breakfast.   Yes [provider]  ezetimibe (ZETIA) 10 MG tablet Take 1 tablet (10 mg  total) by mouth daily. 04/14/22  Yes Alver Sorrow, NP  hydrochlorothiazide (MICROZIDE) 12.5 MG capsule Take 1 capsule (12.5 mg total) by mouth daily. 04/12/22  Yes Alver Sorrow, NP  Latanoprost PF 0.005 % SOLN Place 1 drop into both eyes at bedtime. 11/25/22  Yes [provider]  Multiple Vitamin (MULTIVITAMIN WITH MINERALS) TABS tablet Take 1 tablet by mouth daily.   Yes [provider]  pravastatin (PRAVACHOL) 40 MG tablet Take 1 tablet (40 mg total) by mouth every evening. 04/14/22  Yes Alver Sorrow, NP  valsartan (DIOVAN) 320 MG tablet Take 1 tablet (320 mg total) by mouth daily. Please schedule appointment with Dr. Duke Salvia for refills. 04/12/22  Yes Alver Sorrow, NP      Allergies    Codeine, Meloxicam, Sulfamethoxazole-trimethoprim, and Tramadol    Review of Systems   Review of Systems  Cardiovascular:  Positive for chest pain.    Physical Exam Updated Vital Signs BP (!) 178/56 (BP Location: Right Arm)   Pulse 60   Temp 98.3 F (36.8 C) (Oral)   Resp 13   Ht 5\' 2"  (1.575 m)   Wt 61 kg   SpO2 99%   BMI 24.60 kg/m  Physical Exam Vitals and nursing note reviewed.  Constitutional:      General: She is not in  acute distress.    Appearance: She is well-developed.  HENT:     Head: Normocephalic and atraumatic.  Eyes:     General: No visual field deficit.    Pupils: Pupils are equal, round, and reactive to light.  Cardiovascular:     Rate and Rhythm: Normal rate and regular rhythm.     Heart sounds: Normal heart sounds. No murmur heard.    No friction rub.  Pulmonary:     Effort: Pulmonary effort is normal.     Breath sounds: Normal breath sounds. No wheezing or rales.  Abdominal:     General: Bowel sounds are normal. There is no distension.     Palpations: Abdomen is soft.     Tenderness: There is no abdominal tenderness. There is no guarding or rebound.  Musculoskeletal:        General: No tenderness. Normal range of motion.      Comments: No edema  Skin:    General: Skin is warm and dry.     Findings: No rash.  Neurological:     Mental Status: She is alert and oriented to person, place, and time.     Cranial Nerves: No cranial nerve deficit.     Sensory: Sensation is intact. No sensory deficit.     Motor: No weakness or abnormal muscle tone.     Coordination: Finger-Nose-Finger Test and Heel to [place] Test normal.     Comments: Some memory issues and word finding but can get it with time.  No notable nystagmus  Psychiatric:        Behavior: Behavior normal.     ED Results / Procedures / Treatments   Labs (all labs ordered are listed, but only abnormal results are displayed) Labs Reviewed  COMPREHENSIVE METABOLIC PANEL - Abnormal; Notable for the following components:      Result Value   CO2 20 (*)    Glucose, Bld 112 (*)    Calcium 8.3 (*)    Total Protein 6.1 (*)    Albumin 3.2 (*)    All other components within normal limits  TROPONIN I (HIGH SENSITIVITY) - Abnormal; Notable for the following components:   Troponin I (High Sensitivity) 2,475 (*)    All other components within normal limits  CBC WITH DIFFERENTIAL/PLATELET  LIPASE, BLOOD  TROPONIN I (HIGH SENSITIVITY)    EKG EKG Interpretation Date/Time:  Wednesday December 07 2022 18:18:49 EDT Ventricular Rate:  52 PR Interval:  147 QRS Duration:  87 QT Interval:  456 QTC Calculation: 425 R Axis:   57  Text Interpretation: Sinus rhythm No significant change since last tracing Confirmed by Gwyneth Sprout ([id number]) on 12/07/2022 8:01:06 PM  Radiology DG Chest Port 1 View  Result Date: 12/07/2022 CLINICAL DATA:  Chest pain EXAM: PORTABLE CHEST 1 VIEW COMPARISON:  11/03/2022 FINDINGS: Single frontal view of the chest demonstrates an unremarkable cardiac silhouette. Background parenchymal lung scarring without airspace disease, effusion, or pneumothorax. No acute bony abnormalities. IMPRESSION: 1. No acute intrathoracic process. Electronically  Signed   By: Sharlet Salina M.D.   On: 12/07/2022 22:08   MR BRAIN WO CONTRAST  Result Date: 12/07/2022 CLINICAL DATA:  Stroke suspected, persistent dizziness EXAM: MRI HEAD WITHOUT CONTRAST TECHNIQUE: Multiplanar, multiecho pulse sequences of the brain and surrounding structures were obtained without intravenous contrast. COMPARISON:  12/07/2021 MRI head, correlation is also made with 11/03/2022 CT head FINDINGS: Brain: No restricted diffusion to suggest acute or subacute infarct. No acute hemorrhage, mass, mass effect, or midline shift.  No hydrocephalus or extra-axial collection. Partial empty sella. Normal craniocervical junction. No hemosiderin deposition to suggest remote hemorrhage. Age related cerebral atrophy, with ex vacuo dilatation of the ventricles. Atrophy is slightly more advanced in the posterior left frontal lobe. Scattered T2 hyperintense signal in the periventricular white matter, likely the sequela of mild chronic small vessel ischemic disease. Vascular: Normal arterial flow voids. Skull and upper cervical spine: Normal marrow signal. Sinuses/Orbits: Clear paranasal sinuses. No acute finding in the orbits. Other: The mastoid air cells are well aerated. IMPRESSION: No acute intracranial process. No evidence of acute or subacute infarct. Electronically Signed   By: Wiliam Ke M.D.   On: 12/07/2022 21:36    Procedures Procedures    Medications Ordered in ED Medications  aspirin chewable tablet 324 mg (324 mg Oral Given 12/07/22 2342)    ED Course/ Medical Decision Making/ A&P                                 Medical Decision Making Amount and/or Complexity of Data Reviewed Independent Historian: spouse External Data Reviewed: notes. Labs: ordered. Decision-making details documented in ED Course. Radiology: ordered and independent interpretation performed. Decision-making details documented in ED Course. ECG/medicine tests: ordered and independent interpretation performed.  Decision-making details documented in ED Course.  Risk OTC drugs. Decision regarding hospitalization.   Pt with multiple medical problems and comorbidities and presenting today with a complaint that caries a high risk for morbidity and mortality. Presenting with persistent dizziness and today with chest pain.  Dizziness etiology is unclear.  She has had zio patch but waitting for results and neg head/c-spine CT.  Pt has no focal findings on exam but she feels fine while she is lying down.  Blood pressure is elevated here but remainder of vital signs are reassuring.  Patient is still reporting she has a pinching like pain in her chest.  Patient's symptoms are not classic for PE as she is not tachycardic does not take any beta-blockers, recently was admitted and had a normal TSH and echo.  Some of patient's memory issues may be related to discontinuing the Aricept but they do not want her to continue this until more information is given. 11:51 PM I have independently visualized and interpreted pt's images today.  Chest x-ray today is negative without acute findings, radiology reports that there is no acute intracranial process.  I independently interpreted patient's labs and EKG and EKG today is normal without acute findings.  CBC within normal limits, CMP, lipase normal but trop today is >2,000.  Pt currently pain free and given ASA 324mg .  Will discuss case with cardiology.  Concern for NSTEMI.  Findings discussed with the patient and her family.  Repeat EKG is unchanged from prior and shows no evidence of ST elevation.  Spoke with cardiology and at this time he wants to do a repeat troponin to ensure that this is accurate as all of her other testing is normal.  11:51 PM Spoke again with cardiology and at this time we will load patient with heparin and admit to cardiology service.  Second troponin is pending.  CRITICAL CARE Performed by: Primrose Oler Total critical care time: 30  minutes Critical care time was exclusive of separately billable procedures and treating other patients. Critical care was necessary to treat or prevent imminent or life-threatening deterioration. Critical care was time spent personally by me on the following activities: development of treatment  plan with patient and/or surrogate as well as nursing, discussions with consultants, evaluation of patient's response to treatment, examination of patient, obtaining history from patient or surrogate, ordering and performing treatments and interventions, ordering and review of laboratory studies, ordering and review of radiographic studies, pulse oximetry and re-evaluation of patient's condition.        Final Clinical Impression(s) / ED Diagnoses Final diagnoses:  NSTEMI (non-ST elevated myocardial infarction) [facility])    Rx / DC Orders ED Discharge Orders     None         Gwyneth Sprout, MD 12/07/22 2351

## 2022-12-07 NOTE — H&P (Signed)
Cardiology Admission History and Physical   Patient ID: RUBLE CHMURA MRN: [id number]; DOB: March 03, 1941   Admission date: 12/07/2022  PCP:  Mahlon Gammon, MD   [facility] Providers Cardiologist:  Chilton Si, MD        Chief Complaint:  chest pain  Patient Profile:   Sandra Wheeler is a 82 y.o. female with HTN, HL, mild cognitive impairment who is being seen 12/07/2022 for the evaluation of chest pain.  History of Present Illness:   Recently hospitalized 11/04/22 for syncope. Cardiology was consulted. Patient was noted to have bradycardia with heart rates in sinus down to 40-50s. Home atenolol, aricept, and amlodipine discontinued. TTE showed LVEF of 60-65% with no valvular disease. Discharged with recommendation for outpatient ZIO patch and follow up. Zio patch results show mainly normal sinus rhythm with minimal ectopic burden.   Today, patient presents after having "pinching in my left chest". She has not had this sensation before. Family also says she looked uncomfortable. This sensation did not radiate, self resolved by the time she arrived to ED. Denies associated diaphoresis, shortness of  breath, presyncope, palpitations, nausea/vomiting.  On arrival to ED, vitals were stable with mild sinus bradycardia to 50s, permissive hypertension given recent syncope admission. Labs most notable for troponin 2475 from baseline normal on all prior admissions. Labs otherwise unremarkable. EKG with sinus bradycardia no ischemic changes. 324 ASA and heparin gtt ordered.   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/25/2019   Cataract    unsure of which eye   Colon polyps    Environmental allergies    Essential hypertension 06/25/2019   Genetic testing 04/28/2017   Multi-Cancer panel (83 genes) @ [facility] - No pathogenic mutations detected   GERD (gastroesophageal reflux  disease)    Glaucoma    High cholesterol    Hypertension    IBS (irritable bowel syndrome)    Memory change    Osteoporosis 06/2016   T score -2.8   PONV (postoperative nausea and vomiting)    Pure hypercholesterolemia 06/25/2019   Seizures (HCC)    childhood, 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    Past Surgical History:  Procedure Laterality Date   ABDOMINAL HYSTERECTOMY     BLADDER SUSPENSION N/A 04/08/2016   Procedure: TRANSVAGINAL TAPE (TVT) PROCEDURE;  Surgeon: Genia Del, MD;  Location: [facility];  Service: Gynecology;  Laterality: N/A;   BREAST SURGERY     cyst removal   CARDIAC CATHETERIZATION     CATARACT EXTRACTION, BILATERAL     COLONOSCOPY     CYSTOSCOPY N/A 04/08/2016   Procedure: CYSTOSCOPY;  Surgeon: Genia Del, MD;  Location: [facility];  Service: Gynecology;  Laterality: N/A;   DENTAL SURGERY  03/31/2016   ELBOW FRACTURE SURGERY     EYE SURGERY Left    blood clot on retina   ROBOTIC ASSISTED TOTAL HYSTERECTOMY WITH BILATERAL SALPINGO OOPHERECTOMY Bilateral 04/08/2016   Procedure: ROBOTIC ASSISTED TOTAL HYSTERECTOMY WITH BILATERAL SALPINGO OOPHORECTOMY/Uterosacral Ligament Suspension;  Surgeon: Genia Del, MD;  Location: [facility];  Service: Gynecology;  Laterality: Bilateral;  Request 4 hrs.   TUBAL LIGATION     WRIST ARTHROSCOPY       Medications Prior to Admission: Prior to Admission medications   Medication Sig Start Date End Date Taking? Authorizing Provider  alendronate (FOSAMAX) 70 MG tablet Take 1 tablet by  mouth every 7 days. Take with a full glass of water on an empty stomach Patient taking differently: Take 70 mg by mouth once a week. Sunday 06/20/22  Yes Genia Del, MD  calcium carbonate (CALCIUM 600) 600 MG TABS tablet Take 600 mg by mouth daily with breakfast.   Yes [provider]  ezetimibe (ZETIA) 10 MG tablet Take 1 tablet (10 mg total) by mouth daily. 04/14/22  Yes Alver Sorrow, NP  hydrochlorothiazide (MICROZIDE)  12.5 MG capsule Take 1 capsule (12.5 mg total) by mouth daily. 04/12/22  Yes Alver Sorrow, NP  Latanoprost PF 0.005 % SOLN Place 1 drop into both eyes at bedtime. 11/25/22  Yes [provider]  Multiple Vitamin (MULTIVITAMIN WITH MINERALS) TABS tablet Take 1 tablet by mouth daily.   Yes [provider]  pravastatin (PRAVACHOL) 40 MG tablet Take 1 tablet (40 mg total) by mouth every evening. 04/14/22  Yes Alver Sorrow, NP  valsartan (DIOVAN) 320 MG tablet Take 1 tablet (320 mg total) by mouth daily. Please schedule appointment with Dr. Duke Salvia for refills. 04/12/22  Yes Alver Sorrow, NP     Allergies:    Allergies  Allergen Reactions   Codeine Other (See Comments)    Knocked her out Other reaction(s): Unknown   Meloxicam Other (See Comments)    BLURRY VISION, DIZZY, CONFUSION  Other reaction(s): Unknown   Sulfamethoxazole-Trimethoprim     Unknown reaction   Tramadol Other (See Comments)    CONFUSION     Social History:   Social History   Socioeconomic History   Marital status: Married    Spouse name: richard   Number of children: 1   Years of education: Not on file   Highest education level: Not on file  Occupational History   Not on file  Tobacco Use   Smoking status: Former    Current packs/day: 0.00    Types: Cigarettes    Quit date: 03/21/1969    Years since quitting: 53.7   Smokeless tobacco: Never  Vaping Use   Vaping status: Never Used  Substance and Sexual Activity   Alcohol use: Yes    Alcohol/week: 1.0 standard drink of alcohol    Types: 1 Glasses of wine per week    Comment: 7 a week   Drug use: No   Sexual activity: Not Currently    Birth control/protection: Surgical    Comment: First intercouse at age 3. Only one partner.  Other Topics Concern   Not on file  Social History Narrative   11/17/21 lives with husband   Coffee 1-2 daily   Social Determinants of Health   Financial Resource Strain: Not on file  Food  Insecurity: No Food Insecurity (11/03/2022)   Hunger Vital Sign    Worried About Running Out of Food in the Last Year: Never true    Ran Out of Food in the Last Year: Never true  Transportation Needs: No Transportation Needs (11/03/2022)   PRAPARE - Administrator, Civil Service (Medical): No    Lack of Transportation (Non-Medical): No  Physical Activity: Not on file  Stress: Not on file  Social Connections: Not on file  Intimate Partner Violence: Not At Risk (11/03/2022)   Humiliation, Afraid, Rape, and Kick questionnaire    Fear of Current or Ex-Partner: No    Emotionally Abused: No    Physically Abused: No    Sexually Abused: No    Family History:   The patient's family  history includes Alcohol abuse in her maternal uncle; CAD in her mother; Cancer in her maternal grandmother and another family member; Congestive Heart Failure in her paternal grandfather and paternal uncle; Dementia in her paternal aunt; Diabetes in her father; Hearing loss in her mother; Heart disease in her father and maternal grandfather; Leukemia in her cousin; Macular degeneration in her mother; Other in her brother; Pancreatic cancer (age of onset: 15) in her brother; Pancreatic cancer (age of onset: 8) in her maternal uncle; Rheumatic fever in her father.    ROS:  Please see the history of present illness.  All other ROS reviewed and negative.     Physical Exam/Data:   Vitals:   12/07/22 1930 12/07/22 1945 12/07/22 2306 12/07/22 2306  BP: (!) 177/61 (!) 177/55  (!) 178/56  Pulse: (!) 56 (!) 55  60  Resp: 20 13  13   Temp:   98.3 F (36.8 C)   TempSrc:   Oral   SpO2: 100% 100%  99%  Weight:      Height:       No intake or output data in the 24 hours ending 12/07/22 2344    12/07/2022    6:20 PM 11/30/2022    8:23 AM 11/03/2022    9:27 AM  Last 3 Weights  Weight (lbs) 134 lb 7.7 oz 134 lb 6.4 oz 140 lb  Weight (kg) 61 kg 60.963 kg 63.504 kg     Body mass index is 24.6 kg/m.  General:   Well nourished, well developed, in no acute distress HEENT: normal Neck: no JVD Vascular: No carotid bruits; Distal pulses 2+ bilaterally   Cardiac:  normal S1, S2; RRR; no murmur  Lungs:  clear to auscultation bilaterally, no wheezing, rhonchi or rales  Abd: soft, nontender, no hepatomegaly  Ext: no edema Musculoskeletal:  No deformities, BUE and BLE strength normal and equal Skin: warm and dry  Neuro:  CNs 2-12 intact, no focal abnormalities noted Psych:  Normal affect    EKG:  The ECG that was done was personally reviewed and demonstrates sinus bradycardia.  Relevant CV Studies:  Holter 11/2022: Patient had a min HR of 45 bpm, max HR of 179 bpm, and avg HR of 70 bpm. Predominant underlying rhythm was Sinus Rhythm. Intermittent Bundle Branch Block was present. 2 Ventricular Tachycardia runs occurred, the run with the fastest interval lasting 8  beats with a max rate of 176 bpm (avg 164 bpm); the run with the fastest interval was also the longest. 17 Supraventricular Tachycardia runs occurred, the run with the fastest interval lasting 9 beats with a max rate of 179 bpm, the longest lasting 12  beats with an avg rate of 109 bpm. Isolated SVEs were rare (<1.0%), SVE Couplets were rare (<1.0%), and SVE Triplets were rare (<1.0%). Isolated VEs were rare (<1.0%), and no VE Couplets or VE Triplets were present.  TTE 11/04/22:  1. Left ventricular ejection fraction, by estimation, is 60 to 65%. The  left ventricle has normal function. The left ventricle has no regional  wall motion abnormalities. Left ventricular diastolic parameters are  indeterminate.   2. Right ventricular systolic function is normal. The right ventricular  size is normal.   3. The mitral valve is normal in structure. Trivial mitral valve  regurgitation.   4. The aortic valve is normal in structure. Aortic valve regurgitation is  not visualized.   Laboratory Data:  High Sensitivity Troponin:   Recent Labs  Lab  12/07/22 2152  TROPONINIHS 2,475*      Chemistry Recent Labs  Lab 12/07/22 2152  NA 137  K 3.8  CL 108  CO2 20*  GLUCOSE 112*  BUN 20  CREATININE 0.75  CALCIUM 8.3*  GFRNONAA >60  ANIONGAP 9    Recent Labs  Lab 12/07/22 2152  PROT 6.1*  ALBUMIN 3.2*  AST 27  ALT 16  ALKPHOS 41  BILITOT 0.5   Lipids No results for input(s): "CHOL", "TRIG", "HDL", "LABVLDL", "LDLCALC", "CHOLHDL" in the last 168 hours. Hematology Recent Labs  Lab 12/07/22 2152  WBC 8.5  RBC 3.88  HGB 12.5  HCT 37.7  MCV 97.2  MCH 32.2  MCHC 33.2  RDW 11.6  PLT 288   Thyroid No results for input(s): "TSH", "FREET4" in the last 168 hours. BNPNo results for input(s): "BNP", "PROBNP" in the last 168 hours.  DDimer No results for input(s): "DDIMER" in the last 168 hours.   Radiology/Studies:  DG Chest Port 1 View  Result Date: 12/07/2022 CLINICAL DATA:  Chest pain EXAM: PORTABLE CHEST 1 VIEW COMPARISON:  11/03/2022 FINDINGS: Single frontal view of the chest demonstrates an unremarkable cardiac silhouette. Background parenchymal lung scarring without airspace disease, effusion, or pneumothorax. No acute bony abnormalities. IMPRESSION: 1. No acute intrathoracic process. Electronically Signed   By: Sharlet Salina M.D.   On: 12/07/2022 22:08   MR BRAIN WO CONTRAST  Result Date: 12/07/2022 CLINICAL DATA:  Stroke suspected, persistent dizziness EXAM: MRI HEAD WITHOUT CONTRAST TECHNIQUE: Multiplanar, multiecho pulse sequences of the brain and surrounding structures were obtained without intravenous contrast. COMPARISON:  12/07/2021 MRI head, correlation is also made with 11/03/2022 CT head FINDINGS: Brain: No restricted diffusion to suggest acute or subacute infarct. No acute hemorrhage, mass, mass effect, or midline shift. No hydrocephalus or extra-axial collection. Partial empty sella. Normal craniocervical junction. No hemosiderin deposition to suggest remote hemorrhage. Age related cerebral atrophy, with  ex vacuo dilatation of the ventricles. Atrophy is slightly more advanced in the posterior left frontal lobe. Scattered T2 hyperintense signal in the periventricular white matter, likely the sequela of mild chronic small vessel ischemic disease. Vascular: Normal arterial flow voids. Skull and upper cervical spine: Normal marrow signal. Sinuses/Orbits: Clear paranasal sinuses. No acute finding in the orbits. Other: The mastoid air cells are well aerated. IMPRESSION: No acute intracranial process. No evidence of acute or subacute infarct. Electronically Signed   By: Wiliam Ke M.D.   On: 12/07/2022 21:36     Assessment and Plan:   NSTEMI HTN HL Concerning for ACS given atypical chest pain, risk factors (HTN, HL), and significantly elevated initial troponin. Reassuringly, she is now chest pain free, hemodynamically stable, mentally intact. Will continue to trend biomarker and monitor for symptom recurrence.  - ASA load - heparin bolus and gtt - repeat TTE in AM - NPO for LHC - hold off on anti hypertensives given symptom free and recent syncope admission - continue cholesterol medications  Risk Assessment/Risk Scores:    TIMI Risk Score for Unstable Angina or Non-ST Elevation MI:   The patient's TIMI risk score is 2, which indicates a 8% risk of all cause mortality, new or recurrent myocardial infarction or need for urgent revascularization in the next 14 days.      Code Status: Full Code  Severity of Illness: The appropriate patient status for this patient is INPATIENT. Inpatient status is judged to be reasonable and necessary in order to provide the required intensity of service to ensure the patient's safety. The  patient's presenting symptoms, physical exam findings, and initial radiographic and laboratory data in the context of their chronic comorbidities is felt to place them at high risk for further clinical deterioration. Furthermore, it is not anticipated that the patient will be  medically stable for discharge from the hospital within 2 midnights of admission.   * I certify that at the point of admission it is my clinical judgment that the patient will require inpatient hospital care spanning beyond 2 midnights from the point of admission due to high intensity of service, high risk for further deterioration and high frequency of surveillance required.*   For questions or updates, please contact [facility] Please consult [email or web address] for contact info under     Signed, Lynwood Dawley, MD  12/07/2022 11:44 PM

## 2022-12-08 ENCOUNTER — Encounter (HOSPITAL_COMMUNITY): Payer: Self-pay | Admitting: Interventional Cardiology

## 2022-12-08 ENCOUNTER — Encounter (HOSPITAL_COMMUNITY)
Admission: EM | Disposition: A | Discharge status: Home / Self Care | Payer: Self-pay | Source: Home / Self Care | Attending: Cardiology

## 2022-12-08 DIAGNOSIS — I25110 Atherosclerotic heart disease of native coronary artery with unstable angina pectoris: Secondary | ICD-10-CM

## 2022-12-08 DIAGNOSIS — I2510 Atherosclerotic heart disease of native coronary artery without angina pectoris: Secondary | ICD-10-CM

## 2022-12-08 DIAGNOSIS — I214 Non-ST elevation (NSTEMI) myocardial infarction: Secondary | ICD-10-CM

## 2022-12-08 HISTORY — PX: CORONARY STENT INTERVENTION: CATH118234

## 2022-12-08 HISTORY — PX: LEFT HEART CATH AND CORONARY ANGIOGRAPHY: CATH118249

## 2022-12-08 LAB — CBC
HCT: 35.8 % — ABNORMAL LOW (ref 36.0–46.0)
Hemoglobin: 11.5 g/dL — ABNORMAL LOW (ref 12.0–15.0)
MCH: 31.7 pg (ref 26.0–34.0)
MCHC: 32.1 g/dL (ref 30.0–36.0)
MCV: 98.6 fL (ref 80.0–100.0)
Platelets: 233 10*3/uL (ref 150–400)
RBC: 3.63 MIL/uL — ABNORMAL LOW (ref 3.87–5.11)
RDW: 11.7 % (ref 11.5–15.5)
WBC: 5.6 10*3/uL (ref 4.0–10.5)
nRBC: 0 % (ref 0.0–0.2)

## 2022-12-08 LAB — TROPONIN I (HIGH SENSITIVITY)
Troponin I (High Sensitivity): 2510 ng/L (ref <18)
Troponin I (High Sensitivity): 1393 ng/L (ref <18)
Troponin I (High Sensitivity): 999 ng/L (ref <18)

## 2022-12-08 LAB — BASIC METABOLIC PANEL
Anion gap: 13 (ref 5–15)
BUN: 15 mg/dL (ref 8–23)
CO2: 20 mmol/L — ABNORMAL LOW (ref 22–32)
Calcium: 8 mg/dL — ABNORMAL LOW (ref 8.9–10.3)
Chloride: 108 mmol/L (ref 98–111)
Creatinine, Ser: 0.72 mg/dL (ref 0.44–1.00)
GFR, Estimated: >60 mL/min (ref >60)
Glucose, Bld: 93 mg/dL (ref 70–99)
Potassium: 3.3 mmol/L — ABNORMAL LOW (ref 3.5–5.1)
Sodium: 141 mmol/L (ref 135–145)

## 2022-12-08 LAB — HEPARIN LEVEL (UNFRACTIONATED)
Heparin Unfractionated: 0.45 IU/mL (ref 0.30–0.70)
Heparin Unfractionated: 0.28 IU/mL — ABNORMAL LOW (ref 0.30–0.70)

## 2022-12-08 LAB — POCT ACTIVATED CLOTTING TIME
Activated Clotting Time: 281 seconds
Activated Clotting Time: 360 seconds

## 2022-12-08 SURGERY — LEFT HEART CATH AND CORONARY ANGIOGRAPHY
Anesthesia: LOCAL

## 2022-12-08 MED ORDER — VERAPAMIL HCL 2.5 MG/ML IV SOLN
INTRAVENOUS | Status: AC
  Filled 2022-12-08: qty 2

## 2022-12-08 MED ORDER — SODIUM CHLORIDE 0.9% FLUSH: 3.0000 mL | INTRAVENOUS | Status: DC | PRN

## 2022-12-08 MED ORDER — TICAGRELOR 90 MG PO TABS
ORAL_TABLET | ORAL | Status: DC | PRN
  Administered 2022-12-08: 180 mg via ORAL

## 2022-12-08 MED ORDER — NITROGLYCERIN 1 MG/10 ML FOR IR/CATH LAB
INTRA_ARTERIAL | Status: DC | PRN
  Administered 2022-12-08: 200 ug via INTRA_ARTERIAL
  Administered 2022-12-08: 400 ug via INTRA_ARTERIAL

## 2022-12-08 MED ORDER — HEPARIN SODIUM (PORCINE) 1000 UNIT/ML IJ SOLN
INTRAMUSCULAR | Status: AC
  Filled 2022-12-08: qty 10

## 2022-12-08 MED ORDER — FENTANYL CITRATE (PF) 100 MCG/2ML IJ SOLN
INTRAMUSCULAR | Status: DC | PRN
  Administered 2022-12-08 (×2): 25 ug via INTRAVENOUS

## 2022-12-08 MED ORDER — HEPARIN SODIUM (PORCINE) 1000 UNIT/ML IJ SOLN
INTRAMUSCULAR | Status: DC | PRN
  Administered 2022-12-08: 3000 [IU] via INTRAVENOUS
  Administered 2022-12-08: 2000 [IU] via INTRAVENOUS
  Administered 2022-12-08: 3000 [IU] via INTRAVENOUS

## 2022-12-08 MED ORDER — ACETAMINOPHEN 325 MG PO TABS
650.0000 mg | ORAL_TABLET | ORAL | Status: DC | PRN
  Administered 2022-12-08: 650 mg via ORAL

## 2022-12-08 MED ORDER — ASPIRIN 81 MG PO CHEW
81.0000 mg | CHEWABLE_TABLET | Freq: Every day | ORAL | Status: DC
  Administered 2022-12-08: 81 mg via ORAL
  Filled 2022-12-08 (×2): qty 1

## 2022-12-08 MED ORDER — FENTANYL CITRATE (PF) 100 MCG/2ML IJ SOLN
INTRAMUSCULAR | Status: AC
  Filled 2022-12-08: qty 2

## 2022-12-08 MED ORDER — POTASSIUM CHLORIDE CRYS ER 20 MEQ PO TBCR
40.0000 meq | EXTENDED_RELEASE_TABLET | Freq: Once | ORAL | Status: AC
  Administered 2022-12-08: 40 meq via ORAL
  Filled 2022-12-08: qty 2

## 2022-12-08 MED ORDER — SODIUM CHLORIDE 0.9 % IV SOLN: INTRAVENOUS | Status: AC

## 2022-12-08 MED ORDER — SODIUM CHLORIDE 0.9% FLUSH
3.0000 mL | Freq: Two times a day (BID) | INTRAVENOUS | Status: DC
  Administered 2022-12-08 – 2022-12-09 (×2): 3 mL via INTRAVENOUS

## 2022-12-08 MED ORDER — TICAGRELOR 90 MG PO TABS
90.0000 mg | ORAL_TABLET | Freq: Two times a day (BID) | ORAL | Status: DC
  Administered 2022-12-08 – 2022-12-09 (×2): 90 mg via ORAL
  Filled 2022-12-08 (×2): qty 1

## 2022-12-08 MED ORDER — ACETAMINOPHEN 325 MG PO TABS
ORAL_TABLET | ORAL | Status: AC
  Filled 2022-12-08: qty 2

## 2022-12-08 MED ORDER — HEPARIN (PORCINE) IN NACL 1000-0.9 UT/500ML-% IV SOLN
INTRAVENOUS | Status: DC | PRN
  Administered 2022-12-08 (×2): 500 mL

## 2022-12-08 MED ORDER — LIDOCAINE HCL (PF) 1 % IJ SOLN
INTRAMUSCULAR | Status: DC | PRN
  Administered 2022-12-08: 2 mL via INTRADERMAL

## 2022-12-08 MED ORDER — LIDOCAINE HCL (PF) 1 % IJ SOLN
INTRAMUSCULAR | Status: AC
  Filled 2022-12-08: qty 30

## 2022-12-08 MED ORDER — TICAGRELOR 90 MG PO TABS
ORAL_TABLET | ORAL | Status: AC
  Filled 2022-12-08: qty 2

## 2022-12-08 MED ORDER — EZETIMIBE 10 MG PO TABS
10.0000 mg | ORAL_TABLET | Freq: Every day | ORAL | Status: DC
  Administered 2022-12-08 – 2022-12-09 (×2): 10 mg via ORAL
  Filled 2022-12-08 (×2): qty 1

## 2022-12-08 MED ORDER — HYDRALAZINE HCL 20 MG/ML IJ SOLN: 10.0000 mg | INTRAMUSCULAR | Status: AC | PRN

## 2022-12-08 MED ORDER — NITROGLYCERIN 1 MG/10 ML FOR IR/CATH LAB
INTRA_ARTERIAL | Status: AC
  Filled 2022-12-08: qty 10

## 2022-12-08 MED ORDER — SODIUM CHLORIDE 0.9 % IV SOLN: 250.0000 mL | INTRAVENOUS | Status: DC | PRN

## 2022-12-08 MED ORDER — LABETALOL HCL 5 MG/ML IV SOLN: 10.0000 mg | INTRAVENOUS | Status: AC | PRN

## 2022-12-08 MED ORDER — MIDAZOLAM HCL 2 MG/2ML IJ SOLN
INTRAMUSCULAR | Status: AC
  Filled 2022-12-08: qty 2

## 2022-12-08 MED ORDER — ASPIRIN 81 MG PO CHEW
81.0000 mg | CHEWABLE_TABLET | Freq: Every day | ORAL | Status: DC
  Administered 2022-12-09: 81 mg via ORAL
  Filled 2022-12-08: qty 1

## 2022-12-08 MED ORDER — MIDAZOLAM HCL 2 MG/2ML IJ SOLN
INTRAMUSCULAR | Status: DC | PRN
  Administered 2022-12-08 (×2): 1 mg via INTRAVENOUS

## 2022-12-08 MED ORDER — PRAVASTATIN SODIUM 40 MG PO TABS: 40.0000 mg | ORAL_TABLET | Freq: Every evening | ORAL | Status: DC

## 2022-12-08 MED ORDER — ONDANSETRON HCL 4 MG/2ML IJ SOLN
4.0000 mg | Freq: Four times a day (QID) | INTRAMUSCULAR | Status: DC | PRN
  Administered 2022-12-08: 4 mg via INTRAVENOUS
  Filled 2022-12-08: qty 2

## 2022-12-08 MED ORDER — VERAPAMIL HCL 2.5 MG/ML IV SOLN
INTRAVENOUS | Status: DC | PRN
  Administered 2022-12-08 (×2): 10 mL via INTRA_ARTERIAL

## 2022-12-08 SURGICAL SUPPLY — 19 items
BALLN EMERGE MR 2.0X12 (BALLOONS) ×1 IMPLANT
BALLN ~~LOC~~ EMERGE MR 2.5X8 (BALLOONS) ×1 IMPLANT
BALLOON TAKERU 1.5X12 (BALLOONS) ×1
CATH 5FR JL3.5 JR4 ANG PIG MP (CATHETERS) ×1
CATH LAUNCHER 5F EBU3.5 (CATHETERS) ×1
DEVICE RAD TR BAND REGULAR (VASCULAR PRODUCTS) ×1
GLIDESHEATH SLEND SS 6F .021 (SHEATH) ×1
INQWIRE 1.5J .035X260CM (WIRE) ×1 IMPLANT
KIT ENCORE 26 ADVANTAGE (KITS) ×1
KIT HEMO VALVE WATCHDOG (MISCELLANEOUS) ×1
KIT SINGLE USE MANIFOLD (KITS) ×1
KIT SYRINGE INJ CVI SPIKEX1 (MISCELLANEOUS) ×1
PACK CARDIAC CATHETERIZATION (CUSTOM PROCEDURE TRAY) ×1
PROTECTION STATION PRESSURIZED (MISCELLANEOUS) ×1 IMPLANT
SET ATX-X65L (MISCELLANEOUS) ×1
SHEATH PROBE COVER 6X72 (BAG) ×1
SYNERGY XD 2.25X16 (Permanent Stent) ×1 IMPLANT
TUBING CIL FLEX 10 FLL-RA (TUBING) ×1
WIRE RUNTHROUGH .014X180CM (WIRE) ×1

## 2022-12-08 NOTE — ED Notes (Signed)
Patient transported to cath lab at this time.

## 2022-12-08 NOTE — H&P (View-Only) (Signed)
Rounding Note    Patient Name: Sandra Wheeler Date of Encounter: 12/08/2022  [facility] Cardiologist: Chilton Si, MD   Subjective   Spent extensive time at the bedside today, discussed findings thus far, options for management including cath. Many questions. All answered to the best of my ability.  Patient is unable to give much specific history regarding her symptoms.  Husband is at bedside and notes that they have been dealing with progressive memory issues over the last several months.  She has seen neurology in the past but not recently.  His main concern is actually daily lightheadedness that is worse in the morning and improves throughout the day, and especially worse after eating.  They have had 1 episode of syncope in August, for which they presented to the emergency room.  No further syncope.  She has difficulty describing her chest discomfort, but husband notes that yesterday was the first episode that she has had of this.  They have many questions about whether fixing any potential lesions in the coronary circulation might improve her memory.  We discussed this at length as well.  He also reports that she had a heart cath possibly in 2007, which he did not believe showed any significant disease.  She has had a CAT scan in the past that noted calcified plaque in the RCA and distal left main.  I was able to find a cath report from Dr. Gery Pray from 01/01/2008.  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  The circumflex had a 50% proximal stenosis.  The ramus was large and widely patent.  The right coronary artery was dominant and had minor irregularities.  Inpatient Medications    Scheduled Meds:  aspirin  81 mg Oral Daily   ezetimibe  10 mg Oral Daily   pravastatin  40 mg Oral QPM   Continuous Infusions:  heparin 700 Units/hr (12/08/22 0128)   PRN Meds:     Vital Signs    Vitals:   12/08/22 0620 12/08/22 0900 12/08/22 0924 12/08/22 1023  BP: (!) 133/58  (!) 141/42   Pulse: (!) 58 (!) 52 61   Resp: 12 14 12    Temp: 97.9 F (36.6 C)   98.3 F (36.8 C)  TempSrc: Oral   Oral  SpO2: 98% 98% 100%   Weight:      Height:       No intake or output data in the 24 hours ending 12/08/22 1044    12/07/2022    6:20 PM 11/30/2022    8:23 AM 11/03/2022    9:27 AM  Last 3 Weights  Weight (lbs) 134 lb 7.7 oz 134 lb 6.4 oz 140 lb  Weight (kg) 61 kg 60.963 kg 63.504 kg      Telemetry    SR - Personally Reviewed  Physical Exam   GEN: No acute distress.   Neck: No JVD Cardiac: RRR, no murmurs, rubs, or gallops.  Respiratory: Clear to auscultation bilaterally. GI: Soft, nontender, non-distended  MS: No edema; No deformity. Neuro:  Nonfocal  Psych: Normal affect   New pertinent results (labs, ECG, imaging, cardiac studies)     Patient Profile     82 y.o. female with PMH nonobstructive CAD by cath in 2009, hypertension, hyperlipidemia presenting with chest pain. Enzyme elevation suggests NSTEMI.  Assessment & Plan    NSTEMI Prior nonobstructive CAD  Hyperlipidemia Hypertension -I spent extensive time at bedside today discussing the findings of elevated troponins and options for management.  We discussed heart cath at length, including possible outcomes not limited to medical management, PCI, or recommendation for CABG.  I do have concerns that if she has multivessel disease, given her concerning memory changes, that going on pump for CABG may not be ideal, but this is an issue that can be addressed if multivessel disease is found. -Continue aspirin, heparin and statin. -After extensive discussion, she is amenable for coronary angiography today.  Informed Consent   Shared Decision Making/Informed Consent The risks [stroke, death  kidney failure [usually temporary] (1 in 500), bleeding (1 in 200), allergic reaction [possibly serious] (1  in 200)], benefits (diagnostic support and management of coronary artery disease) and alternatives of a cardiac catheterization were discussed in detail with [name] and she is willing to proceed.      Will make further recommendations based on results of cath and echo later today.    Signed, Jodelle Red, MD  12/08/2022, 10:44 AM

## 2022-12-08 NOTE — Progress Notes (Signed)
TR BAND REMOVAL  LOCATION:    Right radial  DEFLATED PER PROTOCOL:    Yes.    TIME BAND OFF / DRESSING APPLIED:    1810 pm A clean dry dressing applied, secured with coban   SITE UPON ARRIVAL:    Level 1- bruise and tender to touch  SITE AFTER BAND REMOVAL:    Level 1 bruise and tender to touch  CIRCULATION SENSATION AND MOVEMENT:    Within Normal Limits   Yes.    COMMENTS:   Care instructions given to husband and patient.

## 2022-12-08 NOTE — Progress Notes (Signed)
Rounding Note    Patient Name: Sandra Wheeler Date of Encounter: 12/08/2022  [facility] Cardiologist: Chilton Si, MD   Subjective   Spent extensive time at the bedside today, discussed findings thus far, options for management including cath. Many questions. All answered to the best of my ability.  Patient is unable to give much specific history regarding her symptoms.  Husband is at bedside and notes that they have been dealing with progressive memory issues over the last several months.  She has seen neurology in the past but not recently.  His main concern is actually daily lightheadedness that is worse in the morning and improves throughout the day, and especially worse after eating.  They have had 1 episode of syncope in August, for which they presented to the emergency room.  No further syncope.  She has difficulty describing her chest discomfort, but husband notes that yesterday was the first episode that she has had of this.  They have many questions about whether fixing any potential lesions in the coronary circulation might improve her memory.  We discussed this at length as well.  He also reports that she had a heart cath possibly in 2007, which he did not believe showed any significant disease.  She has had a CAT scan in the past that noted calcified plaque in the RCA and distal left main.  I was able to find a cath report from Dr. Gery Pray from 01/01/2008.  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  The circumflex had a 50% proximal stenosis.  The ramus was large and widely patent.  The right coronary artery was dominant and had minor irregularities.  Inpatient Medications    Scheduled Meds:  aspirin  81 mg Oral Daily   ezetimibe  10 mg Oral Daily   pravastatin  40 mg Oral QPM   Continuous Infusions:  heparin 700 Units/hr (12/08/22 0128)   PRN Meds:     Vital Signs    Vitals:   12/08/22 0620 12/08/22 0900 12/08/22 0924 12/08/22 1023  BP: (!) 133/58  (!) 141/42   Pulse: (!) 58 (!) 52 61   Resp: 12 14 12    Temp: 97.9 F (36.6 C)   98.3 F (36.8 C)  TempSrc: Oral   Oral  SpO2: 98% 98% 100%   Weight:      Height:       No intake or output data in the 24 hours ending 12/08/22 1044    12/07/2022    6:20 PM 11/30/2022    8:23 AM 11/03/2022    9:27 AM  Last 3 Weights  Weight (lbs) 134 lb 7.7 oz 134 lb 6.4 oz 140 lb  Weight (kg) 61 kg 60.963 kg 63.504 kg      Telemetry    SR - Personally Reviewed  Physical Exam   GEN: No acute distress.   Neck: No JVD Cardiac: RRR, no murmurs, rubs, or gallops.  Respiratory: Clear to auscultation bilaterally. GI: Soft, nontender, non-distended  MS: No edema; No deformity. Neuro:  Nonfocal  Psych: Normal affect   New pertinent results (labs, ECG, imaging, cardiac studies)     Patient Profile     82 y.o. female with PMH nonobstructive CAD by cath in 2009, hypertension, hyperlipidemia presenting with chest pain. Enzyme elevation suggests NSTEMI.  Assessment & Plan    NSTEMI Prior nonobstructive CAD  Hyperlipidemia Hypertension -I spent extensive time at bedside today discussing the findings of elevated troponins and options for management.  We discussed heart cath at length, including possible outcomes not limited to medical management, PCI, or recommendation for CABG.  I do have concerns that if she has multivessel disease, given her concerning memory changes, that going on pump for CABG may not be ideal, but this is an issue that can be addressed if multivessel disease is found. -Continue aspirin, heparin and statin. -After extensive discussion, she is amenable for coronary angiography today.  Informed Consent   Shared Decision Making/Informed Consent The risks [stroke, death  kidney failure [usually temporary] (1 in 500), bleeding (1 in 200), allergic reaction [possibly serious] (1  in 200)], benefits (diagnostic support and management of coronary artery disease) and alternatives of a cardiac catheterization were discussed in detail with Sandra Wheeler and she is willing to proceed.      Will make further recommendations based on results of cath and echo later today.    Signed, Jodelle Red, MD  12/08/2022, 10:44 AM

## 2022-12-08 NOTE — Telephone Encounter (Signed)
Message routed to PCP Gupta, Anjali L, MD  

## 2022-12-08 NOTE — Progress Notes (Signed)
ANTICOAGULATION CONSULT NOTE  Pharmacy Consult for heparin gtt Indication: chest pain/ACS  Allergies  Allergen Reactions   Codeine Other (See Comments)    Knocked her out Other reaction(s): Unknown   Meloxicam Other (See Comments)    BLURRY VISION, DIZZY, CONFUSION  Other reaction(s): Unknown   Sulfamethoxazole-Trimethoprim     Unknown reaction   Tramadol Other (See Comments)    CONFUSION     Patient Measurements: Height: 5\' 2"  (157.5 cm) Weight: 61 kg (134 lb 7.7 oz) IBW/kg (Calculated) : 50.1 Heparin Dosing Weight: 61 kg  Vital Signs: Temp: 98.3 F (36.8 C) (09/19 1023) Temp Source: Oral (09/19 1023) BP: 141/42 (09/19 0924) Pulse Rate: 61 (09/19 0924)  Labs: Recent Labs    12/07/22 2152 12/07/22 2343 12/08/22 0620 12/08/22 0953  HGB 12.5  --  11.5*  --   HCT 37.7  --  35.8*  --   PLT 288  --  233  --   HEPARINUNFRC  --   --   --  0.45  CREATININE 0.75  --  0.72  --   TROPONINIHS 2,475* 2,510* 1,393*  --     Estimated Creatinine Clearance: 46.6 mL/min (by C-G formula based on SCr of 0.72 mg/dL).   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/25/2019   Cataract    unsure of which eye   Colon polyps    Environmental allergies    Essential hypertension 06/25/2019   Genetic testing 04/28/2017   Multi-Cancer panel (83 genes) @ [facility] - No pathogenic mutations detected   GERD (gastroesophageal reflux disease)    Glaucoma    High cholesterol    Hypertension    IBS (irritable bowel syndrome)    Memory change    Osteoporosis 06/2016   T score -2.8   PONV (postoperative nausea and vomiting)    Pure hypercholesterolemia 06/25/2019   Seizures (HCC)    childhood, 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 Found to have elevated trop up to 2475. Pharmacy consulted for heparin drip in setting of NSTEMI. No  anticoag prior to admission. CBC WNL   Initial heparin level 0.45, no issues with infusion or overt s/sx of bleeding reported by RN.  Goal of Therapy:  Heparin level 0.3-0.7 units/ml Monitor platelets by anticoagulation protocol: Yes   Plan:  Continue heparin infusion at 700 units/hr 8hr HL  Daily HL, CBC thereafter F/u cardiology plans  Ruben Im, PharmD Clinical Pharmacist 12/08/2022 11:10 AM Please check AMION for all [facility] Pharmacy numbers

## 2022-12-08 NOTE — Interval H&P Note (Signed)
Cath Lab Visit (complete for each Cath Lab visit)  Clinical Evaluation Leading to the Procedure:   ACS: Yes.    Non-ACS:    Anginal Classification: CCS IV  Anti-ischemic medical therapy: Minimal Therapy (1 class of medications)  Non-Invasive Test Results: No non-invasive testing performed  Prior CABG: No previous CABG      History and Physical Interval Note:  12/08/2022 12:58 PM  Sandra Wheeler  has presented today for surgery, with the diagnosis of NSTEMI.  The various methods of treatment have been discussed with the patient and family. After consideration of risks, benefits and other options for treatment, the patient has consented to  Procedure(s): LEFT HEART CATH AND CORONARY ANGIOGRAPHY (N/A) as a surgical intervention.  The patient's history has been reviewed, patient examined, no change in status, stable for surgery.  I have reviewed the patient's chart and labs.  Questions were answered to the patient's satisfaction.     Lance Muss

## 2022-12-09 ENCOUNTER — Other Ambulatory Visit (HOSPITAL_COMMUNITY): Payer: Self-pay

## 2022-12-09 ENCOUNTER — Inpatient Hospital Stay (HOSPITAL_COMMUNITY): Payer: Medicare Other

## 2022-12-09 DIAGNOSIS — Z955 Presence of coronary angioplasty implant and graft: Secondary | ICD-10-CM

## 2022-12-09 DIAGNOSIS — I214 Non-ST elevation (NSTEMI) myocardial infarction: Secondary | ICD-10-CM

## 2022-12-09 LAB — BASIC METABOLIC PANEL
Anion gap: 10 (ref 5–15)
BUN: 12 mg/dL (ref 8–23)
CO2: 19 mmol/L — ABNORMAL LOW (ref 22–32)
Calcium: 8.3 mg/dL — ABNORMAL LOW (ref 8.9–10.3)
Chloride: 109 mmol/L (ref 98–111)
Creatinine, Ser: 0.78 mg/dL (ref 0.44–1.00)
GFR, Estimated: >60 mL/min (ref >60)
Glucose, Bld: 99 mg/dL (ref 70–99)
Potassium: 3.6 mmol/L (ref 3.5–5.1)
Sodium: 138 mmol/L (ref 135–145)

## 2022-12-09 LAB — ECHOCARDIOGRAM COMPLETE
Area-P 1/2: 3.21 cm2
Height: 62 in
S' Lateral: 2.1 cm
Weight: 2151.69 oz

## 2022-12-09 LAB — CBC
HCT: 33.6 % — ABNORMAL LOW (ref 36.0–46.0)
Hemoglobin: 11.3 g/dL — ABNORMAL LOW (ref 12.0–15.0)
MCH: 32.2 pg (ref 26.0–34.0)
MCHC: 33.6 g/dL (ref 30.0–36.0)
MCV: 95.7 fL (ref 80.0–100.0)
Platelets: 246 10*3/uL (ref 150–400)
RBC: 3.51 MIL/uL — ABNORMAL LOW (ref 3.87–5.11)
RDW: 11.8 % (ref 11.5–15.5)
WBC: 5.7 10*3/uL (ref 4.0–10.5)
nRBC: 0 % (ref 0.0–0.2)

## 2022-12-09 MED ORDER — VALSARTAN 80 MG PO TABS
80.0000 mg | ORAL_TABLET | Freq: Every day | ORAL | 3 refills | Status: DC
Start: 2022-12-09 — End: 2023-02-27
  Filled 2022-12-09: qty 90, 90d supply, fill #0

## 2022-12-09 MED ORDER — TICAGRELOR 90 MG PO TABS
90.0000 mg | ORAL_TABLET | Freq: Two times a day (BID) | ORAL | 11 refills | Status: AC
Start: 2022-12-09 — End: ?
  Filled 2022-12-09: qty 60, 30d supply, fill #0

## 2022-12-09 MED ORDER — ASPIRIN 81 MG PO CHEW
81.0000 mg | CHEWABLE_TABLET | Freq: Every day | ORAL | 3 refills | Status: DC
  Filled 2022-12-09: qty 90, 90d supply, fill #0

## 2022-12-09 NOTE — TOC Benefit Eligibility Note (Signed)
Patient Product/process development scientist completed.    The patient is insured through [facility] . Patient has Medicare and is not eligible for a copay card, but may be able to apply for patient assistance, if available.    Ran test claim for Brilinta 90 mg and the current 30 day co-pay is $175.43 due to a deductible.   This test claim was processed through [facility]- copay amounts may vary at other pharmacies due to pharmacy/plan contracts, or as the patient moves through the different stages of their insurance plan.     Roland Earl, CPHT Pharmacy Technician III Certified Patient Advocate [facility] Patient Advocate Team Direct Number: [phone]  Fax: [phone]

## 2022-12-09 NOTE — Progress Notes (Signed)
  Echocardiogram 2D Echocardiogram has been performed.  Delcie Roch 12/09/2022, 3:44 PM

## 2022-12-09 NOTE — Plan of Care (Signed)

## 2022-12-09 NOTE — Discharge Summary (Signed)
Discharge Summary    Patient ID: Sandra Wheeler MRN: [id number]; DOB: 11-28-1940  Admit date: 12/07/2022 Discharge date: 12/09/2022  PCP:  Mahlon Gammon, MD   [facility] Providers Cardiologist:  Chilton Si, MD     Discharge Diagnoses    Principal Problem:   NSTEMI (non-ST elevated myocardial infarction) [facility]) Active Problems:   Essential hypertension   Pure hypercholesterolemia   Dementia [facility])    Diagnostic Studies/Procedures    Left Heart Catheterization 12/08/22   Ramus-2 lesion is 95% stenosed.  A drug-eluting stent was successfully placed using a SYNERGY XD 2.25X16.   Post intervention, there is a 0% residual stenosis.   Prox Cx lesion is 25% stenosed.   Ost LAD to Prox LAD lesion is 40% stenosed.   Ramus-1 lesion is 25% stenosed.   Mid LM to Dist LM lesion is 25% stenosed.   The left ventricular systolic function is normal.   LV end diastolic pressure is mildly elevated.   The left ventricular ejection fraction is 55-65% by visual estimate.   There is no aortic valve stenosis.   In the absence of any other complications or medical issues, we expect the patient to be ready for discharge from an interventional cardiology perspective on 12/09/2022.   Recommend uninterrupted dual antiplatelet therapy with Aspirin 81mg  daily and Ticagrelor 90mg  twice daily for a minimum of 12 months (ACS-Class I recommendation).   Vasospasm noted in teh right radial.  5 Fr guide catheter used. Severe coronary vasospasm noted as well.  IC NTG given.   Successful PCI of the proximal ramus vessel with drug-eluting stent as noted above.  Due to tortuosity and moderate, diffuse disease of the distal left main and ostial LAD, delivering balloons and stents was difficult.  5 [place] guide catheter had to be used due to significant vasospasm and discomfort in the right wrist.  Continue dual antiplatelet therapy for at least a year along with aggressive secondary prevention.  If she  has any bleeding issues, could stop aspirin and use Brilinta monotherapy or clopidogrel monotherapy. Diagnostic Dominance: Right  Intervention    Echocardiogram 12/09/22  1. Left ventricular ejection fraction, by estimation, is 60 to 65%. The  left ventricle has normal function. The left ventricle has no regional  wall motion abnormalities. Left ventricular diastolic function could not  be evaluated.   2. Right ventricular systolic function is normal. The right ventricular  size is normal. There is normal pulmonary artery systolic pressure.   3. The mitral valve is normal in structure. No evidence of mitral valve  regurgitation. No evidence of mitral stenosis.   4. The aortic valve is normal in structure. Aortic valve regurgitation is  trivial. No aortic stenosis is present.   5. The inferior vena cava is normal in size with greater than 50%  respiratory variability, suggesting right atrial pressure of 3 mmHg.  _____________   History of Present Illness     Sandra Wheeler is a 82 y.o. female with a past medical history of hypertension, hyperlipidemia, mild cognitive impairment.  Per chart review, patient was recently hospitalized 11/04/2022 for syncope.  Cardiology was consulted.  Patient was noted to have bradycardia with heart rates in sinus down to the 40s-50s.  Home atenolol, Aricept, and amlodipine were discontinued.  Underwent echocardiogram on 11/04/2022 that showed EF 60-65%, no regional wall motion abnormalities, normal RV function.  After discharge, patient wore a cardiac monitor that showed predominantly normal sinus rhythm with minimal ectopic burden.  Patient presented to the ED on 9/18 after having "pinching in my left chest".  Patient denied ever having this sensation before.  Family also reported that patient looked uncomfortable.  Pinching feeling in the chest did not radiate and had resolved by the time she arrived in the ED.  Denied associated diaphoresis, shortness of  breath, presyncope, palpitations, nausea, vomiting.  In the ED, vital signs were stable with mild sinus bradycardia to the 50s, permissive hypertension given recent syncope admission.  Initial high-sensitivity troponin 2475.  EKG with sinus bradycardia and no ischemic changes.  She was given 324 of aspirin and was started on heparin GTT.  She was admitted to the cardiology service for treatment of NSTEMI.  Hospital Course     Consultants: None   After patient was admitted to the cardiology service, troponins continue to trend upward (2475, 2510, 1393, 999.) after a long discussion with patient and family, it was decided that patient was willing to undergo cardiac catheterization with possible PCI.  She underwent cardiac catheterization on 12/08/2022 that showed 95% stenosis in ramus 2 that was treated with drug-eluting stent.  Otherwise, there was 25% stenosis in proximal circumflex, 40% stenosis in ostial-proximal LAD, 25% stenosis in ramus 1, 25% stenosis mid-distal LM.  EF was 55-65% by visual estimate.  Patient was started on aspirin and Brilinta with recommendations to continue for minimum of 12 months. Echocardiogram on 12/09/22 showed EF 60-65%, no regional wall motion abnormalities, normal RV systolic function, normal RV size, normal pulmonary artery systolic pressure.   NSTEMI Coronary artery disease -Patient arrived to the ED on 9/18 complaining of an episode of pinching left chest pain.  High-sensitivity troponin 2475, 2510, 1393, 999.  EKG with sinus bradycardia, no ischemic changes. -She was taken to the Cath Lab on 9/19.  Found to have 95% stenosis in ramus 2 that was treated with drug-eluting stent.  Otherwise, there was nonobstructive disease that will be managed medically - Echocardiogram this admission with EF 60-65%, no regional wall motion abnormalities, normal RV function  -Continue DAPT with aspirin, Brilinta for at least 12 months - Continue pravastatin, zetia - LDL was 75 in  05/2022 - Not on BB due to bradycardia  - Patient has a follow up appointment with general cardiology on 10/11 - Radial cath site stable prior to DC. Discussed radial site care and provided written instructions on AVS   HTN  - BP has been elevated this admission- of note, she was admitted in 10/2022 for syncope  - With patient's advanced age, recent syncopal event, developing memory issues, prefer to allow BP to be high rather than risk it dropping too low  - Reduced home dose of valsartan from 320 mg to 80 mg. Stop hydrochlorothiazide  - Asked husband to keep a BP log and bring to follow up appointment   HLD  - Lipid panel in 05/2022 showed LDL 75, HDL 52, triglycerides 157, total cholesterol 150  - Continue pravastatin 40 mg daily, zetia 10 mg daily   Dementia  - Husband reports that patient has had progressive memory issues over the last several months   Did the patient have an acute coronary syndrome (MI, NSTEMI, STEMI, etc) this admission?:  Yes                               AHA/ACC Clinical Performance & Quality Measures: Aspirin prescribed? - Yes ADP Receptor Inhibitor (Plavix/Clopidogrel, Brilinta/Ticagrelor or Effient/Prasugrel) prescribed (  includes medically managed patients)? - Yes Beta Blocker prescribed? - No. The patient has an EF >/= 50%. Based upon the [facility] Study pub in Apr 2024, there is no benefit in patients with preserved EF post MI. High Intensity Statin (Lipitor 40-80mg  or Crestor 20-40mg ) prescribed? - No - Patient has been on pravastatin, zetia. Tolerating these medications well  EF assessed during THIS hospitalization? - Yes For EF <40%, was ACEI/ARB prescribed? - Not Applicable (EF >/= 40%) For EF <40%, Aldosterone Antagonist (Spironolactone or Eplerenone) prescribed? - Not Applicable (EF >/= 40%) Cardiac Rehab Phase II ordered (including medically managed patients)? - Yes    _____________  Discharge Vitals Blood pressure (!) 138/52, pulse (!) 56,  temperature 97.6 F (36.4 C), temperature source Oral, resp. rate 12, height 5\' 2"  (1.575 m), weight 61 kg, SpO2 98%.  Filed Weights   12/07/22 1820  Weight: 61 kg    Labs & Radiologic Studies    CBC Recent Labs    12/07/22 2152 12/08/22 0620 12/09/22 0830  WBC 8.5 5.6 5.7  NEUTROABS 6.4  --   --   HGB 12.5 11.5* 11.3*  HCT 37.7 35.8* 33.6*  MCV 97.2 98.6 95.7  PLT 288 233 246   Basic Metabolic Panel Recent Labs    40/98/11 0620 12/09/22 0830  NA 141 138  K 3.3* 3.6  CL 108 109  CO2 20* 19*  GLUCOSE 93 99  BUN 15 12  CREATININE 0.72 0.78  CALCIUM 8.0* 8.3*   Liver Function Tests Recent Labs    12/07/22 2152  AST 27  ALT 16  ALKPHOS 41  BILITOT 0.5  PROT 6.1*  ALBUMIN 3.2*   Recent Labs    12/07/22 2152  LIPASE 47   High Sensitivity Troponin:   Recent Labs  Lab 12/07/22 2152 12/07/22 2343 12/08/22 0620 12/08/22 1911  TROPONINIHS 2,475* 2,510* 1,393* 999*    BNP Invalid input(s): "POCBNP" D-Dimer No results for input(s): "DDIMER" in the last 72 hours. Hemoglobin A1C No results for input(s): "HGBA1C" in the last 72 hours. Fasting Lipid Panel No results for input(s): "CHOL", "HDL", "LDLCALC", "TRIG", "CHOLHDL", "LDLDIRECT" in the last 72 hours. Thyroid Function Tests No results for input(s): "TSH", "T4TOTAL", "T3FREE", "THYROIDAB" in the last 72 hours.  Invalid input(s): "FREET3" _____________  ECHOCARDIOGRAM COMPLETE  Result Date: 12/09/2022    ECHOCARDIOGRAM REPORT   Patient Name:   Sandra Wheeler Date of Exam: 12/09/2022 Medical Rec #:  [id number]      Height:       62.0 in Accession #:    [id number]     Weight:       134.5 lb Date of Birth:  1941/03/06      BSA:          1.615 m Patient Age:    82 years       BP:           138/52 mmHg Patient Gender: F              HR:           56 bpm. Exam Location:  Inpatient Procedure: 2D Echo, Cardiac Doppler and Color Doppler Indications:    NSTEMI  History:        Patient has prior history of  Echocardiogram examinations, most                 recent 11/04/2022. CAD; Risk Factors:Hypertension and  Dyslipidemia.  Sonographer:    Delcie Roch RDCS Referring Phys: [id number] Jonita Albee IMPRESSIONS  1. Left ventricular ejection fraction, by estimation, is 60 to 65%. The left ventricle has normal function. The left ventricle has no regional wall motion abnormalities. Left ventricular diastolic function could not be evaluated.  2. Right ventricular systolic function is normal. The right ventricular size is normal. There is normal pulmonary artery systolic pressure.  3. The mitral valve is normal in structure. No evidence of mitral valve regurgitation. No evidence of mitral stenosis.  4. The aortic valve is normal in structure. Aortic valve regurgitation is trivial. No aortic stenosis is present.  5. The inferior vena cava is normal in size with greater than 50% respiratory variability, suggesting right atrial pressure of 3 mmHg. FINDINGS  Left Ventricle: Left ventricular ejection fraction, by estimation, is 60 to 65%. The left ventricle has normal function. The left ventricle has no regional wall motion abnormalities. The left ventricular internal cavity size was normal in size. There is  no left ventricular hypertrophy. Left ventricular diastolic function could not be evaluated due to indeterminate diastolic function. Left ventricular diastolic function could not be evaluated. Right Ventricle: The right ventricular size is normal. No increase in right ventricular wall thickness. Right ventricular systolic function is normal. There is normal pulmonary artery systolic pressure. The tricuspid regurgitant velocity is 2.62 m/s, and  with an assumed right atrial pressure of 3 mmHg, the estimated right ventricular systolic pressure is 30.5 mmHg. Left Atrium: Left atrial size was normal in size. Right Atrium: Right atrial size was normal in size. Pericardium: There is no evidence of pericardial  effusion. Mitral Valve: The mitral valve is normal in structure. No evidence of mitral valve regurgitation. No evidence of mitral valve stenosis. Tricuspid Valve: The tricuspid valve is normal in structure. Tricuspid valve regurgitation is trivial. No evidence of tricuspid stenosis. Aortic Valve: The aortic valve is normal in structure. Aortic valve regurgitation is trivial. No aortic stenosis is present. Pulmonic Valve: The pulmonic valve was normal in structure. Pulmonic valve regurgitation is not visualized. No evidence of pulmonic stenosis. Aorta: The aortic root is normal in size and structure. Venous: The inferior vena cava is normal in size with greater than 50% respiratory variability, suggesting right atrial pressure of 3 mmHg. IAS/Shunts: No atrial level shunt detected by color flow Doppler.  LEFT VENTRICLE PLAX 2D LVIDd:         3.90 cm   Diastology LVIDs:         2.10 cm   LV e' medial:    7.29 cm/s LV PW:         0.90 cm   LV E/e' medial:  13.9 LV IVS:        0.90 cm   LV e' lateral:   7.07 cm/s LVOT diam:     1.80 cm   LV E/e' lateral: 14.3 LV SV:         72 LV SV Index:   45 LVOT Area:     2.54 cm  RIGHT VENTRICLE             IVC RV Basal diam:  2.40 cm     IVC diam: 1.80 cm RV S prime:     11.40 cm/s TAPSE (M-mode): 2.2 cm LEFT ATRIUM             Index        RIGHT ATRIUM          Index LA  diam:        3.70 cm 2.29 cm/m   RA Area:     8.82 cm LA Vol (A2C):   43.2 ml 26.75 ml/m  RA Volume:   17.10 ml 10.59 ml/m LA Vol (A4C):   31.6 ml 19.57 ml/m LA Biplane Vol: 37.9 ml 23.47 ml/m  AORTIC VALVE LVOT Vmax:   107.00 cm/s LVOT Vmean:  74.400 cm/s LVOT VTI:    0.284 m  AORTA Ao Root diam: 2.70 cm Ao Asc diam:  2.80 cm MITRAL VALVE                TRICUSPID VALVE MV Area (PHT): 3.21 cm     TR Peak grad:   27.5 mmHg MV Decel Time: 236 msec     TR Vmax:        262.00 cm/s MV E velocity: 101.00 cm/s MV A velocity: 74.40 cm/s   SHUNTS MV E/A ratio:  1.36         Systemic VTI:  0.28 m                              Systemic Diam: 1.80 cm Aditya Sabharwal Electronically signed by Dorthula Nettles Signature Date/Time: 12/09/2022/3:48:58 PM    Final    CARDIAC CATHETERIZATION  Result Date: 12/08/2022   Ramus-2 lesion is 95% stenosed.  A drug-eluting stent was successfully placed using a SYNERGY XD 2.25X16.   Post intervention, there is a 0% residual stenosis.   Prox Cx lesion is 25% stenosed.   Ost LAD to Prox LAD lesion is 40% stenosed.   Ramus-1 lesion is 25% stenosed.   Mid LM to Dist LM lesion is 25% stenosed.   The left ventricular systolic function is normal.   LV end diastolic pressure is mildly elevated.   The left ventricular ejection fraction is 55-65% by visual estimate.   There is no aortic valve stenosis.   In the absence of any other complications or medical issues, we expect the patient to be ready for discharge from an interventional cardiology perspective on 12/09/2022.   Recommend uninterrupted dual antiplatelet therapy with Aspirin 81mg  daily and Ticagrelor 90mg  twice daily for a minimum of 12 months (ACS-Class I recommendation).   Vasospasm noted in teh right radial.  5 Fr guide catheter used. Severe coronary vasospasm noted as well.  IC NTG given. Successful PCI of the proximal ramus vessel with drug-eluting stent as noted above.  Due to tortuosity and moderate, diffuse disease of the distal left main and ostial LAD, delivering balloons and stents was difficult.  5 [place] guide catheter had to be used due to significant vasospasm and discomfort in the right wrist.  Continue dual antiplatelet therapy for at least a year along with aggressive secondary prevention.  If she has any bleeding issues, could stop aspirin and use Brilinta monotherapy or clopidogrel monotherapy.   DG Chest Port 1 View  Result Date: 12/07/2022 CLINICAL DATA:  Chest pain EXAM: PORTABLE CHEST 1 VIEW COMPARISON:  11/03/2022 FINDINGS: Single frontal view of the chest demonstrates an unremarkable cardiac silhouette. Background  parenchymal lung scarring without airspace disease, effusion, or pneumothorax. No acute bony abnormalities. IMPRESSION: 1. No acute intrathoracic process. Electronically Signed   By: Sharlet Salina M.D.   On: 12/07/2022 22:08   MR BRAIN WO CONTRAST  Result Date: 12/07/2022 CLINICAL DATA:  Stroke suspected, persistent dizziness EXAM: MRI HEAD WITHOUT CONTRAST TECHNIQUE: Multiplanar, multiecho pulse sequences of the  brain and surrounding structures were obtained without intravenous contrast. COMPARISON:  12/07/2021 MRI head, correlation is also made with 11/03/2022 CT head FINDINGS: Brain: No restricted diffusion to suggest acute or subacute infarct. No acute hemorrhage, mass, mass effect, or midline shift. No hydrocephalus or extra-axial collection. Partial empty sella. Normal craniocervical junction. No hemosiderin deposition to suggest remote hemorrhage. Age related cerebral atrophy, with ex vacuo dilatation of the ventricles. Atrophy is slightly more advanced in the posterior left frontal lobe. Scattered T2 hyperintense signal in the periventricular white matter, likely the sequela of mild chronic small vessel ischemic disease. Vascular: Normal arterial flow voids. Skull and upper cervical spine: Normal marrow signal. Sinuses/Orbits: Clear paranasal sinuses. No acute finding in the orbits. Other: The mastoid air cells are well aerated. IMPRESSION: No acute intracranial process. No evidence of acute or subacute infarct. Electronically Signed   By: Wiliam Ke M.D.   On: 12/07/2022 21:36   Disposition   Pt is being discharged home today in good condition.  Follow-up Plans & Appointments     Discharge Instructions     Amb Referral to Cardiac Rehabilitation   Complete by: As directed    Diagnosis:  Coronary Stents NSTEMI PTCA     After initial evaluation and assessments completed: Virtual Based Care may be provided alone or in conjunction with Phase 2 Cardiac Rehab based on patient barriers.:  Yes   Intensive Cardiac Rehabilitation (ICR) [facility] location only OR Traditional Cardiac Rehabilitation (TCR) *If criteria for ICR are not met will enroll in TCR [facility] only): Yes   Diet - low sodium heart healthy   Complete by: As directed    Discharge instructions   Complete by: As directed    Radial Site Care Refer to this sheet in the next few weeks. These instructions provide you with information on caring for yourself after your procedure. Your caregiver may also give you more specific instructions. Your treatment has been planned according to current medical practices, but problems sometimes occur. Call your caregiver if you have any problems or questions after your procedure. HOME CARE INSTRUCTIONS You may shower the day after the procedure. Remove the bandage (dressing) and gently wash the site with plain soap and water. Gently pat the site dry.  Do not apply powder or lotion to the site.  Do not submerge the affected site in water for 3 to 5 days.  Inspect the site at least twice daily.  Do not flex or bend the affected arm for 24 hours.  No lifting over 5 pounds (2.3 kg) for 5 days after your procedure.  Do not drive home if you are discharged the same day of the procedure. Have someone else drive you.  You may drive 24 hours after the procedure unless otherwise instructed by your caregiver.  What to expect: Any bruising will usually fade within 1 to 2 weeks.  Blood that collects in the tissue (hematoma) may be painful to the touch. It should usually decrease in size and tenderness within 1 to 2 weeks.  SEEK IMMEDIATE MEDICAL CARE IF: You have unusual pain at the radial site.  You have redness, warmth, swelling, or pain at the radial site.  You have drainage (other than a small amount of blood on the dressing).  You have chills.  You have a fever or persistent symptoms for more than 72 hours.  You have a fever and your symptoms suddenly get worse.  Your arm becomes pale, cool,  tingly, or numb.  You have  heavy bleeding from the site. Hold pressure on the site.   PLEASE DO NOT MISS ANY DOSES OF YOUR BRILINTA!!!!! Also keep a log of you blood pressures and bring back to your follow up appt. Please call the office with any questions.   Patients taking blood thinners should generally stay away from medicines like ibuprofen, Advil, Motrin, naproxen, and Aleve due to risk of stomach bleeding. You may take Tylenol as directed or talk to your primary doctor about alternatives.  PLEASE ENSURE THAT YOU DO NOT RUN OUT OF YOUR BRILINTA. This medication is very important to remain on for at least one year. IF you have issues obtaining this medication due to cost please CALL the office 3-5 business days prior to running out in order to prevent missing doses of this medication.   Increase activity slowly   Complete by: As directed         Discharge Medications   Allergies as of 12/09/2022       Reactions   Codeine Other (See Comments)   Knocked her out Other reaction(s): Unknown   Meloxicam Other (See Comments)   BLURRY VISION, DIZZY, CONFUSION  Other reaction(s): Unknown   Sulfamethoxazole-trimethoprim    Unknown reaction   Tramadol Other (See Comments)   CONFUSION         Medication List     STOP taking these medications    hydrochlorothiazide 12.5 MG capsule Commonly known as: MICROZIDE       TAKE these medications    alendronate 70 MG tablet Commonly known as: FOSAMAX Take 1 tablet by mouth every 7 days. Take with a full glass of water on an empty stomach What changed: See the new instructions.   aspirin 81 MG chewable tablet Chew 1 tablet (81 mg total) by mouth daily. Start taking on: December 10, 2022   Calcium 600 600 MG Tabs tablet Generic drug: calcium carbonate Take 600 mg by mouth daily with breakfast.   ezetimibe 10 MG tablet Commonly known as: ZETIA Take 1 tablet (10 mg total) by mouth daily.   Latanoprost PF 0.005 %  Soln Place 1 drop into both eyes at bedtime.   multivitamin with minerals Tabs tablet Take 1 tablet by mouth daily.   pravastatin 40 MG tablet Commonly known as: PRAVACHOL Take 1 tablet (40 mg total) by mouth every evening.   ticagrelor 90 MG Tabs tablet Commonly known as: BRILINTA Take 1 tablet (90 mg total) by mouth 2 (two) times daily.   valsartan 80 MG tablet Commonly known as: DIOVAN Take 1 tablet (80 mg total) by mouth daily. Please schedule appointment with Dr. Duke Salvia for refills. What changed:  medication strength how much to take           Outstanding Labs/Studies    Duration of Discharge Encounter   Greater than 30 minutes including physician time.  Signed, Jonita Albee, PA-C 12/09/2022, 4:29 PM

## 2022-12-09 NOTE — Progress Notes (Signed)
CARDIAC REHAB PHASE I   PRE:  Rate/Rhythm: 51 SB in bed    BP: sitting 148/44    SpO2: 100 RA  MODE:  Ambulation: 370 ft   POST:  Rate/Rhythm: 95 SR    BP: sitting 154/46     SpO2: 100 RA  Pt initially reluctant to walk but willing with encouragement. She is reliant on her husband due to dementia and poor vision. Slow to process transitions. To BR then ambulated hall with HHA, contact guard with gait belt and husband on her right side. Slow pace but no c/o CP or dizziness (which apparently has been an ongoing recent problem). HR low in bed but increased appropriately with activity.   Discussed with pt and husband MI, stent, Brilinta, walking for exercise, NTG and CRPII. Pt and husband receptive. Will refer to [facility] CRPII.  [phone]  Ethelda Chick BS, ACSM-CEP 12/09/2022 10:00 AM

## 2022-12-13 LAB — LIPOPROTEIN A (LPA): Lipoprotein (a): 39.2 nmol/L — ABNORMAL HIGH (ref <75.0)

## 2022-12-14 ENCOUNTER — Encounter: Payer: Self-pay | Admitting: Internal Medicine

## 2022-12-14 ENCOUNTER — Non-Acute Institutional Stay: Payer: Medicare Other | Admitting: Internal Medicine

## 2022-12-14 ENCOUNTER — Encounter: Payer: Medicare Other | Admitting: Internal Medicine

## 2022-12-14 VITALS — BP 130/68 | HR 67 | Temp 97.8°F | Resp 17 | Ht 62.0 in | Wt 129.0 lb

## 2022-12-14 DIAGNOSIS — R42 Dizziness and giddiness: Secondary | ICD-10-CM | POA: Diagnosis not present

## 2022-12-14 DIAGNOSIS — K219 Gastro-esophageal reflux disease without esophagitis: Secondary | ICD-10-CM

## 2022-12-14 DIAGNOSIS — E7800 Pure hypercholesterolemia, unspecified: Secondary | ICD-10-CM

## 2022-12-14 DIAGNOSIS — I214 Non-ST elevation (NSTEMI) myocardial infarction: Secondary | ICD-10-CM | POA: Diagnosis not present

## 2022-12-14 DIAGNOSIS — M810 Age-related osteoporosis without current pathological fracture: Secondary | ICD-10-CM

## 2022-12-14 DIAGNOSIS — I10 Essential (primary) hypertension: Secondary | ICD-10-CM

## 2022-12-14 DIAGNOSIS — R21 Rash and other nonspecific skin eruption: Secondary | ICD-10-CM

## 2022-12-14 DIAGNOSIS — F03A0 Unspecified dementia, mild, without behavioral disturbance, psychotic disturbance, mood disturbance, and anxiety: Secondary | ICD-10-CM

## 2022-12-15 ENCOUNTER — Telehealth (HOSPITAL_COMMUNITY): Payer: Self-pay

## 2022-12-15 NOTE — Telephone Encounter (Signed)
Attempted to call patient in regards to Cardiac Rehab - LM on VM 

## 2022-12-19 NOTE — Telephone Encounter (Signed)
Looks like she was recently hospitalized with an NSTEMI. Between that and the other episode of bradycardia I think it's best to wait until her Cardiology appointment on 10/11. If they are okay with her restarting Aricept then we can restart at 5 mg.

## 2022-12-21 ENCOUNTER — Encounter: Payer: Medicare Other | Admitting: Internal Medicine

## 2022-12-22 ENCOUNTER — Encounter (HOSPITAL_BASED_OUTPATIENT_CLINIC_OR_DEPARTMENT_OTHER): Payer: Self-pay | Admitting: Cardiovascular Disease

## 2022-12-22 MED ORDER — TICAGRELOR 90 MG PO TABS: 90.0000 mg | ORAL_TABLET | Freq: Two times a day (BID) | ORAL | 11 refills | Status: DC

## 2022-12-29 ENCOUNTER — Telehealth (HOSPITAL_BASED_OUTPATIENT_CLINIC_OR_DEPARTMENT_OTHER): Payer: Self-pay

## 2022-12-29 NOTE — Telephone Encounter (Addendum)
Left message for patient to call back   ----- Message from [facility] sent at 12/27/2022  3:54 PM EDT ----- Monitor showed some fast heart rates from the top and bottom chambers of the heart.  Recommend adding metoprolol succinate 25 mg daily.

## 2022-12-30 ENCOUNTER — Encounter (HOSPITAL_BASED_OUTPATIENT_CLINIC_OR_DEPARTMENT_OTHER): Payer: Self-pay | Admitting: Family

## 2022-12-30 ENCOUNTER — Ambulatory Visit (HOSPITAL_BASED_OUTPATIENT_CLINIC_OR_DEPARTMENT_OTHER): Payer: Medicare Other | Admitting: Family

## 2022-12-30 VITALS — BP 122/80 | HR 66 | Ht 62.0 in | Wt 133.0 lb

## 2022-12-30 DIAGNOSIS — I25118 Atherosclerotic heart disease of native coronary artery with other forms of angina pectoris: Secondary | ICD-10-CM | POA: Diagnosis not present

## 2022-12-30 DIAGNOSIS — I472 Ventricular tachycardia, unspecified: Secondary | ICD-10-CM

## 2022-12-30 DIAGNOSIS — E785 Hyperlipidemia, unspecified: Secondary | ICD-10-CM | POA: Diagnosis not present

## 2022-12-30 DIAGNOSIS — R001 Bradycardia, unspecified: Secondary | ICD-10-CM | POA: Diagnosis not present

## 2022-12-30 DIAGNOSIS — I471 Supraventricular tachycardia, unspecified: Secondary | ICD-10-CM

## 2022-12-30 DIAGNOSIS — I10 Essential (primary) hypertension: Secondary | ICD-10-CM

## 2022-12-30 MED ORDER — METOPROLOL SUCCINATE ER 25 MG PO TB24: 12.5000 mg | ORAL_TABLET | Freq: Every day | ORAL | 11 refills | Status: DC

## 2022-12-30 NOTE — Progress Notes (Signed)
Cardiology Office Note:  .   Date:  12/30/2022  ID:  Sandra Wheeler, DOB [birth year], MRN [id number] PCP: Mahlon Gammon, MD  [facility] Providers Cardiologist:  Chilton Si, MD    History of Present Illness: .   Sandra Wheeler is a 82 y.o. female  with a hx of hypertension, hyperlipidemia, bradycardia, syncope, CAD s/p NSTEMI 12/07/22 with DES-proximal ramus.   Prior coronary CTA calcium score 78 in 2009. Calcified plaque in RCA and distal LM. No obstructive disease.    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 She was started on Donepezil which improved word finding. At follow up 02/23/22 Namenda added and referred for formal neuropsychological testing.   She presents today for follow-up with her husband. Resides at [facility] and enjoys participating in activities there. Notes her blood pressure at home is routinely in the 130s.  She notes an occasional headache and wonders if that is related to her medications. Reports no shortness of breath nor dyspnea on exertion. Reports no chest pain, pressure, or tightness. No edema, orthopnea, PND. Reports no palpitations.    Admitted 8/15-8/16/24 with a syncope with associated bradycardia heart rate down to the 40s-50s.  Home atenolol, Aricept, amlodipine were discontinued.  Echo with LVEF 60 to 65%, normal aortic valve.  Outpatient Zio patch recommended revealed average heart rate 70 bpm and 2 episodes of NSVT up to 8 beats and 17 episodes of SVT up to 12 beats.  Metoprolol succinate 25 mg daily was recommended but she has not yet been reached with these recommendations.    Admitted 9/18 - 12/09/2022 with NSTEMI with LHC requiring PCI of proximal ramus vessel with DES. Echo LVEF 60-65%, no RWMA, no significant valvular abnormalities.   Presents today for follow up with her husband.  Excited for upcoming trip to [place].  She is feeling very fatigued after minimal activity.  Hopeful this will improve with  participation in cardiac rehab. No chest pain, pressure, tightness. Reports occasional lightheadedness which is improved from previous. Little bit of a headache the past few days which is resolved with 1 tablet of Tylenol.  She does dehydrated and getting predominantly water and decaf coffee with an occasional soda.  We reviewed her monitor report and indication for low-dose metoprolol.   ROS: Please see the history of present illness.    All other systems reviewed and are negative.   Studies Reviewed: .        Cardiac Studies & Procedures   CARDIAC CATHETERIZATION  CARDIAC CATHETERIZATION 12/08/2022  Narrative   Ramus-2 lesion is 95% stenosed.  A drug-eluting stent was successfully placed using a SYNERGY XD 2.25X16.   Post intervention, there is a 0% residual stenosis.   Prox Cx lesion is 25% stenosed.   Ost LAD to Prox LAD lesion is 40% stenosed.   Ramus-1 lesion is 25% stenosed.   Mid LM to Dist LM lesion is 25% stenosed.   The left ventricular systolic function is normal.   LV end diastolic pressure is mildly elevated.   The left ventricular ejection fraction is 55-65% by visual estimate.   There is no aortic valve stenosis.   In the absence of any other complications or medical issues, we expect the patient to be ready for discharge from an interventional cardiology perspective on 12/09/2022.   Recommend uninterrupted dual antiplatelet therapy with Aspirin 81mg  daily and Ticagrelor 90mg  twice daily for a minimum of 12 months (ACS-Class I recommendation).  Vasospasm noted in teh right radial.  5 Fr guide catheter used. Severe coronary vasospasm noted as well.  IC NTG given.  Successful PCI of the proximal ramus vessel with drug-eluting stent as noted above.  Due to tortuosity and moderate, diffuse disease of the distal left main and ostial LAD, delivering balloons and stents was difficult.  5 [place] guide catheter had to be used due to significant vasospasm and discomfort in the right  wrist.  Continue dual antiplatelet therapy for at least a year along with aggressive secondary prevention.  If she has any bleeding issues, could stop aspirin and use Brilinta monotherapy or clopidogrel monotherapy.  Findings Coronary Findings Diagnostic  Dominance: Right  Left Main Mid LM to Dist LM lesion is 25% stenosed.  Left Anterior Descending Ost LAD to Prox LAD lesion is 40% stenosed.  Ramus Intermedius Ramus-1 lesion is 25% stenosed. Ramus-2 lesion is 95% stenosed. Vessel is the culprit lesion. The lesion is eccentric.  Left Circumflex Prox Cx lesion is 25% stenosed.  Right Coronary Artery The vessel exhibits minimal luminal irregularities.  Intervention  Ramus-2 lesion Stent CATH LAUNCHER 70F EBU3.5 guide catheter was inserted. Lesion crossed with guidewire using a WIRE RUNTHROUGH .[id number]. Pre-stent angioplasty was performed using a BALLOON TAKERU 1.5X12. 2.0 balloon would not cross so Takeru was used.  A drug-eluting stent was successfully placed using a SYNERGY XD 2.25X16. Stent strut is well apposed. Post-stent angioplasty was performed using a BALLN [place] EMERGE MR 2.5X8. Post-Intervention Lesion Assessment The intervention was successful. Pre-interventional TIMI flow is 3. Post-intervention TIMI flow is 3. No complications occurred at this lesion. There is a 0% residual stenosis post intervention.     ECHOCARDIOGRAM  ECHOCARDIOGRAM COMPLETE 12/09/2022  Narrative ECHOCARDIOGRAM REPORT    Patient Name:   Sandra Wheeler Date of Exam: 12/09/2022 Medical Rec #:  [id number]      Height:       62.0 in Accession #:    [id number]     Weight:       134.5 lb Date of Birth:  [birth year]      BSA:          1.615 m Patient Age:    82 years       BP:           138/52 mmHg Patient Gender: F              HR:           56 bpm. Exam Location:  Inpatient  Procedure: 2D Echo, Cardiac Doppler and Color Doppler  Indications:    NSTEMI  History:        Patient has prior history  of Echocardiogram examinations, most recent 11/04/2022. CAD; Risk Factors:Hypertension and Dyslipidemia.  Sonographer:    Delcie Roch RDCS Referring Phys: [id number] Jonita Albee  IMPRESSIONS   1. Left ventricular ejection fraction, by estimation, is 60 to 65%. The left ventricle has normal function. The left ventricle has no regional wall motion abnormalities. Left ventricular diastolic function could not be evaluated. 2. Right ventricular systolic function is normal. The right ventricular size is normal. There is normal pulmonary artery systolic pressure. 3. The mitral valve is normal in structure. No evidence of mitral valve regurgitation. No evidence of mitral stenosis. 4. The aortic valve is normal in structure. Aortic valve regurgitation is trivial. No aortic stenosis is present. 5. The inferior vena cava is normal in size with greater than 50% respiratory variability, suggesting right atrial pressure of 3  mmHg.  FINDINGS Left Ventricle: Left ventricular ejection fraction, by estimation, is 60 to 65%. The left ventricle has normal function. The left ventricle has no regional wall motion abnormalities. The left ventricular internal cavity size was normal in size. There is no left ventricular hypertrophy. Left ventricular diastolic function could not be evaluated due to indeterminate diastolic function. Left ventricular diastolic function could not be evaluated.  Right Ventricle: The right ventricular size is normal. No increase in right ventricular wall thickness. Right ventricular systolic function is normal. There is normal pulmonary artery systolic pressure. The tricuspid regurgitant velocity is 2.62 m/s, and with an assumed right atrial pressure of 3 mmHg, the estimated right ventricular systolic pressure is 30.5 mmHg.  Left Atrium: Left atrial size was normal in size.  Right Atrium: Right atrial size was normal in size.  Pericardium: There is no evidence of pericardial  effusion.  Mitral Valve: The mitral valve is normal in structure. No evidence of mitral valve regurgitation. No evidence of mitral valve stenosis.  Tricuspid Valve: The tricuspid valve is normal in structure. Tricuspid valve regurgitation is trivial. No evidence of tricuspid stenosis.  Aortic Valve: The aortic valve is normal in structure. Aortic valve regurgitation is trivial. No aortic stenosis is present.  Pulmonic Valve: The pulmonic valve was normal in structure. Pulmonic valve regurgitation is not visualized. No evidence of pulmonic stenosis.  Aorta: The aortic root is normal in size and structure.  Venous: The inferior vena cava is normal in size with greater than 50% respiratory variability, suggesting right atrial pressure of 3 mmHg.  IAS/Shunts: No atrial level shunt detected by color flow Doppler.   LEFT VENTRICLE PLAX 2D LVIDd:         3.90 cm   Diastology LVIDs:         2.10 cm   LV e' medial:    7.29 cm/s LV PW:         0.90 cm   LV E/e' medial:  13.9 LV IVS:        0.90 cm   LV e' lateral:   7.07 cm/s LVOT diam:     1.80 cm   LV E/e' lateral: 14.3 LV SV:         72 LV SV Index:   45 LVOT Area:     2.54 cm   RIGHT VENTRICLE             IVC RV Basal diam:  2.40 cm     IVC diam: 1.80 cm RV S prime:     11.40 cm/s TAPSE (M-mode): 2.2 cm  LEFT ATRIUM             Index        RIGHT ATRIUM          Index LA diam:        3.70 cm 2.29 cm/m   RA Area:     8.82 cm LA Vol (A2C):   43.2 ml 26.75 ml/m  RA Volume:   17.10 ml 10.59 ml/m LA Vol (A4C):   31.6 ml 19.57 ml/m LA Biplane Vol: 37.9 ml 23.47 ml/m AORTIC VALVE LVOT Vmax:   107.00 cm/s LVOT Vmean:  74.400 cm/s LVOT VTI:    0.284 m  AORTA Ao Root diam: 2.70 cm Ao Asc diam:  2.80 cm  MITRAL VALVE                TRICUSPID VALVE MV Area (PHT): 3.21 cm     TR Peak grad:  27.5 mmHg MV Decel Time: 236 msec     TR Vmax:        262.00 cm/s MV E velocity: 101.00 cm/s MV A velocity: 74.40 cm/s   SHUNTS MV E/A  ratio:  1.36         Systemic VTI:  0.28 m Systemic Diam: 1.80 cm  Aditya Sabharwal Electronically signed by Dorthula Nettles Signature Date/Time: 12/09/2022/3:48:58 PM    Final    MONITORS  LONG TERM MONITOR (3-14 DAYS) 11/30/2022  Narrative 14 Day Zio Monitor  Quality: Fair.  Baseline artifact. Predominant rhythm: sinus rhythm Average heart rate: 70 bpm Max heart rate: 144 bpm Min heart rate: 45 bpm Pauses >2.5 seconds:none  Rare PACs and PVCs (<1%) 2 episodes of NSVT lasting up to 8 beats 17 episodes of SVT lasting up to 12 beats   Tiffany C. Duke Salvia, MD, [facility] 12/11/2022 9:46 AM           Risk Assessment/Calculations:             Physical Exam:   VS:  BP 122/80 (BP Location: Left Arm, Patient Position: Sitting, Cuff Size: Normal)   Pulse 66   Ht 5\' 2"  (1.575 m)   Wt 133 lb (60.3 kg)   SpO2 99%   BMI 24.33 kg/m    Wt Readings from Last 3 Encounters:  12/30/22 133 lb (60.3 kg)  12/14/22 129 lb (58.5 kg)  12/07/22 134 lb 7.7 oz (61 kg)    GEN: Well nourished, well developed in no acute distress NECK: No JVD; No carotid bruits CARDIAC: RRR, no murmurs, rubs, gallops RESPIRATORY:  Clear to auscultation without rales, wheezing or rhonchi  ABDOMEN: Soft, non-tender, non-distended EXTREMITIES:  No edema; No deformity   ASSESSMENT AND PLAN: .   HTN - BP well controlled. Continue current antihypertensive regimen.     HLD -12/09/2022 LP(a) 39.2.  05/2022 LDL 75.  LDL goal <70. Continue Pravastatin 40mg  QD, Zetia 10mg  QD. Consider repeat lipid panel at next follow up to reassess LDL, if not at goal could transition to Rosuvastatin.   Bradycardia / VT / SVT - Amlodipine, Aricept, Atenolol discontinued during 08/2022 hypotension. However subsequent monitor with brief episodes of VT and SVT. Dr. Duke Salvia has recommended resumption of Metoprolol. Will start with low dose Toprol 12.5mg  daily.   CAD s/p NSTEMI 12/07/22 with DES-proximal ramus - Recommended for DAPT x 12  months (unless bleeding issues in which case brilinta or plavix monotherapy). Stable with no anginal symptoms. No indication for ischemic evaluation.  Anticipate deconditioning is etiology of activity intolerance. Should improve with participation in cardiac rehab. GDMT pravastatin, metoprolol, zetia, aspirin, brilinta. If Brilinta becomes cost prohibitive, plan to transition to Plavix.  Heart healthy diet and regular cardiovascular exercise encouraged.           Cardiac Rehabilitation Eligibility Assessment  The patient is ready to start cardiac rehabilitation from a cardiac standpoint.       Dispo: follow up in 3 months  Signed, Alver Sorrow, NP

## 2022-12-30 NOTE — Patient Instructions (Addendum)
Medication Instructions:  Your physician has recommended you make the following change in your medication:   Start: Metoprolol Succinate 12.5mg  daily   If you want to switch from Brilinta to Plavix please let us know!   *If you need a refill on your cardiac medications before your next appointment, please call your pharmacy*  Follow-Up: At [facility], you and your health needs are our priority.  As part of our continuing mission to provide you with exceptional heart care, we have created designated Provider Care Teams.  These Care Teams include your primary Cardiologist (physician) and Advanced Practice Providers (APPs -  Physician Assistants and Nurse Practitioners) who all work together to provide you with the care you need, when you need it.  We recommend signing up for the patient portal called "MyChart".  Sign up information is provided on this After Visit Summary.  MyChart is used to connect with patients for Virtual Visits (Telemedicine).  Patients are able to view lab/test results, encounter notes, upcoming appointments, etc.  Non-urgent messages can be sent to your provider as well.   To learn more about what you can do with MyChart, go to [email or web address].    Your next appointment:   3 months with Dr. Duke Salvia or Gillian Shields, NP

## 2023-01-01 ENCOUNTER — Other Ambulatory Visit: Payer: Self-pay | Admitting: Obstetrics & Gynecology

## 2023-01-02 NOTE — Telephone Encounter (Signed)
Med refill request:Fosamax 70mg  Last AEX: 04/14/21 Next AEX: not scheduled, msg sent to schedule breast/pelvic exam in January 2025 Last MMG (if hormonal med) 12/06/22 Refill authorized: Please approve or deny as appropriate.

## 2023-01-05 ENCOUNTER — Telehealth (HOSPITAL_COMMUNITY): Payer: Self-pay

## 2023-01-05 ENCOUNTER — Encounter (HOSPITAL_COMMUNITY): Payer: Self-pay

## 2023-01-05 NOTE — Telephone Encounter (Signed)
Attempted to call patient in regards to Cardiac Rehab - LM on VM Mailed letter

## 2023-01-10 ENCOUNTER — Telehealth (HOSPITAL_COMMUNITY): Payer: Self-pay

## 2023-01-10 NOTE — Telephone Encounter (Signed)
Attempted to call pt in regards to Cardiac rehab. LM on VM

## 2023-01-10 NOTE — Telephone Encounter (Signed)
Pt insurance is active and benefits verified through [facility] Co-pay $0, DED $800/$800 met, out of pocket $1700/$1700 met, co-insurance 0%. no pre-authorization required. Passport, 01/10/2023@1 :57, REF# [id number]   How many CR sessions are covered? (36 visits for TCR, 72 visits for ICR)72 Is this a lifetime maximum or an annual maximum? lifetime Has the member used any of these services to date? no Is there a time limit (weeks/months) on start of program and/or program completion? no

## 2023-01-13 ENCOUNTER — Encounter (HOSPITAL_COMMUNITY): Payer: Self-pay

## 2023-01-18 ENCOUNTER — Telehealth (HOSPITAL_COMMUNITY): Payer: Self-pay

## 2023-01-18 NOTE — Telephone Encounter (Signed)
Called pt to confirm appt for 10/31, no answer LM on VM.   Jonna Coup, MS, ACSM-CEP 01/18/2023 1:24 PM

## 2023-01-19 ENCOUNTER — Encounter (HOSPITAL_COMMUNITY): Payer: Self-pay

## 2023-01-19 ENCOUNTER — Encounter (HOSPITAL_COMMUNITY)
Admission: RE | Admit: 2023-01-19 | Discharge: 2023-01-19 | Disposition: A | Discharge status: Home / Self Care | Payer: Medicare Other | Source: Ambulatory Visit | Attending: Cardiovascular Disease | Admitting: Cardiovascular Disease

## 2023-01-19 VITALS — BP 118/68 | Ht 62.75 in | Wt 137.3 lb

## 2023-01-19 DIAGNOSIS — Z955 Presence of coronary angioplasty implant and graft: Secondary | ICD-10-CM | POA: Insufficient documentation

## 2023-01-19 DIAGNOSIS — I214 Non-ST elevation (NSTEMI) myocardial infarction: Secondary | ICD-10-CM | POA: Insufficient documentation

## 2023-01-19 NOTE — Progress Notes (Signed)
Cardiac Individual Treatment Plan  Patient Details  Name: Sandra Wheeler MRN: [id number] Date of Birth: [birth year] Referring Provider:   Flowsheet Row INTENSIVE CARDIAC REHAB ORIENT from 01/19/2023 in [facility]  Referring Provider Chilton Si, MD       Initial Encounter Date:  Flowsheet Row INTENSIVE CARDIAC REHAB ORIENT from 01/19/2023 in [facility]  Date 01/19/23       Visit Diagnosis: 12/07/22 NSTEMI (non-ST elevated myocardial infarction) (HCC)  12/08/22 Status post coronary artery stent placement  Patient's Home Medications on Admission:  Current Outpatient Medications:    alendronate (FOSAMAX) 70 MG tablet, TAKE 1 TABLET BY MOUTH EVERY 7 DAYS WITH A FULL GLASS OF WATER ON AN EMPTY STOMACH, Disp: 12 tablet, Rfl: 0   aspirin 81 MG chewable tablet, Chew 1 tablet (81 mg total) by mouth daily., Disp: 90 tablet, Rfl: 3   calcium carbonate (CALCIUM 600) 600 MG TABS tablet, Take 600 mg by mouth daily with breakfast., Disp: , Rfl:    ezetimibe (ZETIA) 10 MG tablet, Take 1 tablet (10 mg total) by mouth daily., Disp: 90 tablet, Rfl: 3   Latanoprost PF 0.005 % SOLN, Place 1 drop into both eyes at bedtime., Disp: , Rfl:    metoprolol succinate (TOPROL-XL) 25 MG 24 hr tablet, Take 0.5 tablets (12.5 mg total) by mouth daily. Take with or immediately following a meal., Disp: 30 tablet, Rfl: 11   Multiple Vitamin (MULTIVITAMIN WITH MINERALS) TABS tablet, Take 1 tablet by mouth daily., Disp: , Rfl:    pravastatin (PRAVACHOL) 40 MG tablet, Take 1 tablet (40 mg total) by mouth every evening., Disp: 90 tablet, Rfl: 3   ticagrelor (BRILINTA) 90 MG TABS tablet, Take 1 tablet (90 mg total) by mouth 2 (two) times daily., Disp: 60 tablet, Rfl: 11   valsartan (DIOVAN) 80 MG tablet, Take 1 tablet (80 mg total) by mouth daily. Please schedule appointment with Dr. 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/25/2019   Cataract    unsure of which eye   Colon polyps    Environmental allergies    Essential hypertension 06/25/2019   Genetic testing 04/28/2017   Multi-Cancer panel (83 genes) @ [facility] - No pathogenic mutations detected   GERD (gastroesophageal reflux disease)    Glaucoma    High cholesterol    Hypertension    IBS (irritable bowel syndrome)    Memory change    Osteoporosis 06/2016   T score -2.8   PONV (postoperative nausea and vomiting)    Pure hypercholesterolemia 06/25/2019   Seizures (HCC)    childhood, 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    Tobacco Use: Social History   Tobacco Use  Smoking Status Former   Current packs/day: 0.00   Types: Cigarettes   Quit date: 03/21/1969   Years since quitting: 53.8  Smokeless Tobacco Never    Labs: Review Flowsheet       Latest Ref Rng & Units 08/08/2019 10/01/2019 04/12/2022 06/16/2022  Labs for ITP Cardiac and Pulmonary Rehab  Cholestrol <200 mg/dL 578  469  629  528   LDL (calc) mg/dL (calc) 85  95  413  75   Direct LDL 0 - 99 mg/dL - -  145  -  HDL-C > OR = 50 mg/dL 54  51  57  52   Trlycerides <150 mg/dL 102  725  366  440     Details            Capillary Blood Glucose: No results found for: "GLUCAP"   Exercise Target Goals: Exercise Program Goal: Individual exercise prescription set using results from initial 6 min walk test and THRR while considering  patient's activity barriers and safety.   Exercise Prescription Goal: Initial exercise prescription builds to 30-45 minutes a day of aerobic activity, 2-3 days per week.  Home exercise guidelines will be given to patient during program as part of exercise prescription that the participant will acknowledge.  Activity Barriers & Risk Stratification:  Activity  Barriers & Cardiac Risk Stratification - 01/19/23 1258       Activity Barriers & Cardiac Risk Stratification   Activity Barriers Balance Concerns;History of Falls;Deconditioning;Assistive Device    Cardiac Risk Stratification High   <5 METs on            6 Minute Walk:  6 Minute Walk     Row Name 01/19/23 1258         6 Minute Walk   Phase Initial     Distance 1130 feet     Walk Time 6 minutes     # of Rest Breaks 0     MPH 2.14     METS 2.08     RPE 11     Perceived Dyspnea  0     VO2 Peak 7.3     Symptoms No     Resting HR 50 bpm     Resting BP 118/68     Resting Oxygen Saturation  97 %     Exercise Oxygen Saturation  during 6 min walk 98 %     Max Ex. HR 92 bpm     Max Ex. BP 152/70     2 Minute Post BP 122/68              Oxygen Initial Assessment:   Oxygen Re-Evaluation:   Oxygen Discharge (Final Oxygen Re-Evaluation):   Initial Exercise Prescription:  Initial Exercise Prescription - 01/19/23 1200       Date of Initial Exercise RX and Referring Provider   Date 01/19/23    Referring Provider Chilton Si, MD    Expected Discharge Date 04/11/22      NuStep   Level 1    SPM 50    Minutes 30    METs 1.8      Prescription Details   Frequency (times per week) 3    Duration Progress to 30 minutes of continuous aerobic without signs/symptoms of physical distress      Intensity   THRR 40-80% of Max Heartrate 55-110    Ratings of Perceived Exertion 11-13    Perceived Dyspnea 0-4      Progression   Progression Continue progressive overload as per policy without signs/symptoms or physical distress.      Resistance Training   Training Prescription Yes    Weight 2    Reps 10-15             Perform Capillary Blood Glucose checks as needed.  Exercise Prescription Changes:   Exercise Comments:   Exercise Goals and Review:   Exercise Goals     Row Name 01/19/23 1010             Exercise  Goals   Increase  Physical Activity Yes       Intervention Provide advice, education, support and counseling about physical activity/exercise needs.;Develop an individualized exercise prescription for aerobic and resistive training based on initial evaluation findings, risk stratification, comorbidities and participant's personal goals.       Expected Outcomes Short Term: Attend rehab on a regular basis to increase amount of physical activity.;Long Term: Exercising regularly at least 3-5 days a week.;Long Term: Add in home exercise to make exercise part of routine and to increase amount of physical activity.       Increase Strength and Stamina Yes       Intervention Provide advice, education, support and counseling about physical activity/exercise needs.;Develop an individualized exercise prescription for aerobic and resistive training based on initial evaluation findings, risk stratification, comorbidities and participant's personal goals.       Expected Outcomes Short Term: Increase workloads from initial exercise prescription for resistance, speed, and METs.;Short Term: Perform resistance training exercises routinely during rehab and add in resistance training at home;Long Term: Improve cardiorespiratory fitness, muscular endurance and strength as measured by increased METs and functional capacity ( )       Able to understand and use rate of perceived exertion (RPE) scale Yes       Intervention Provide education and explanation on how to use RPE scale       Expected Outcomes Short Term: Able to use RPE daily in rehab to express subjective intensity level;Long Term:  Able to use RPE to guide intensity level when exercising independently       Knowledge and understanding of Target Heart Rate Range (THRR) Yes       Intervention Provide education and explanation of THRR including how the numbers were predicted and where they are located for reference       Expected Outcomes Short Term: Able to state/look up THRR;Short  Term: Able to use daily as guideline for intensity in rehab;Long Term: Able to use THRR to govern intensity when exercising independently       Understanding of Exercise Prescription Yes       Intervention Provide education, explanation, and written materials on patient's individual exercise prescription       Expected Outcomes Short Term: Able to explain program exercise prescription;Long Term: Able to explain home exercise prescription to exercise independently                Exercise Goals Re-Evaluation :   Discharge Exercise Prescription (Final Exercise Prescription Changes):   Nutrition:  Target Goals: Understanding of nutrition guidelines, daily intake of sodium 1500mg , cholesterol 200mg , calories 30% from fat and 7% or less from saturated fats, daily to have 5 or more servings of fruits and vegetables.  Biometrics:  Pre Biometrics - 01/19/23 1045       Pre Biometrics   Waist Circumference 38 inches    Hip Circumference 39 inches    Waist to Hip Ratio 0.97 %    Triceps Skinfold 27 mm    % Body Fat 39.5 %    Grip Strength 12 kg    Flexibility 0 in   not done   Single Leg Stand 0 seconds   not done- pt dizzy             Nutrition Therapy Plan and Nutrition Goals:   Nutrition Assessments:  MEDIFICTS Score Key: >=70 Need to make dietary changes  40-70 Heart Healthy Diet <= 40 Therapeutic Level Cholesterol Diet    Picture Your  Plate Scores: <16 Unhealthy dietary pattern with much room for improvement. 41-50 Dietary pattern unlikely to meet recommendations for good health and room for improvement. 51-60 More healthful dietary pattern, with some room for improvement.  >60 Healthy dietary pattern, although there may be some specific behaviors that could be improved.    Nutrition Goals Re-Evaluation:   Nutrition Goals Re-Evaluation:   Nutrition Goals Discharge (Final Nutrition Goals Re-Evaluation):   Psychosocial: Target Goals: Acknowledge presence  or absence of significant depression and/or stress, maximize coping skills, provide positive support system. Participant is able to verbalize types and ability to use techniques and skills needed for reducing stress and depression.  Initial Review & Psychosocial Screening:  Initial Psych Review & Screening - 01/19/23 1304       Initial Review   Current issues with None Identified      Family Dynamics   Good Support System? Yes   Sandra Wheeler has her husband and daughter who lives in the area for support   Comments Patient has memory deficits that have been going on for the past few years. Cant take aricept currently due to bradycardia.      Barriers   Psychosocial barriers to participate in program There are no identifiable barriers or psychosocial needs.      Screening Interventions   Interventions Provide feedback about the scores to participant;Encouraged to exercise    Expected Outcomes Short Term goal: Identification and review with participant of any Quality of Life or Depression concerns found by scoring the questionnaire.;Long Term goal: The participant improves quality of Life and PHQ9 Scores as seen by post scores and/or verbalization of changes             Quality of Life Scores:  Quality of Life - 01/19/23 1301       Quality of Life   Select Quality of Life      Quality of Life Scores   Health/Function Pre 23.8 %    Socioeconomic Pre 29 %    Psych/Spiritual Pre 28.33 %    Family Pre 29.5 %    GLOBAL Pre 26.44 %            Scores of 19 and below usually indicate a poorer quality of life in these areas.  A difference of  2-3 points is a clinically meaningful difference.  A difference of 2-3 points in the total score of the Quality of Life Index has been associated with significant improvement in overall quality of life, self-image, physical symptoms, and general health in studies assessing change in quality of life.  PHQ-9: Review Flowsheet  More data may exist       01/19/2023 12/14/2022 11/30/2022 09/14/2022 06/01/2022  Depression screen PHQ 2/9  Decreased Interest 0 0 0 0 0  Down, Depressed, Hopeless 0 0 0 0 0  PHQ - 2 Score 0 0 0 0 0  Altered sleeping 1 - - - -  Tired, decreased energy 1 - - - -  Change in appetite 1 - - - -  Feeling bad or failure about yourself  0 - - - -  Trouble concentrating 1 - - - -  Moving slowly or fidgety/restless 1 - - - -  Suicidal thoughts 0 - - - -  PHQ-9 Score 5 - - - -  Difficult doing work/chores Not difficult at all - - - -    Details           Interpretation of Total Score  Total Score Depression  Severity:  1-4 = Minimal depression, 5-9 = Mild depression, 10-14 = Moderate depression, 15-19 = Moderately severe depression, 20-27 = Severe depression   Psychosocial Evaluation and Intervention:   Psychosocial Re-Evaluation:   Psychosocial Discharge (Final Psychosocial Re-Evaluation):   Vocational Rehabilitation: Provide vocational rehab assistance to qualifying candidates.   Vocational Rehab Evaluation & Intervention:  Vocational Rehab - 01/19/23 1051       Initial Vocational Rehab Evaluation & Intervention   Assessment shows need for Vocational Rehabilitation No   [employer] is retired            Education: Education Goals: Education classes will be provided on a weekly basis, covering required topics. Participant will state understanding/return demonstration of topics presented.     Core Videos: Exercise    Move It!  Clinical staff conducted group or individual video education with verbal and written material and guidebook.  Patient learns the recommended Pritikin exercise program. Exercise with the goal of living a long, healthy life. Some of the health benefits of exercise include controlled diabetes, healthier blood pressure levels, improved cholesterol levels, improved heart and lung capacity, improved sleep, and better body composition. Everyone should speak with their doctor  before starting or changing an exercise routine.  Biomechanical Limitations Clinical staff conducted group or individual video education with verbal and written material and guidebook.  Patient learns how biomechanical limitations can impact exercise and how we can mitigate and possibly overcome limitations to have an impactful and balanced exercise routine.  Body Composition Clinical staff conducted group or individual video education with verbal and written material and guidebook.  Patient learns that body composition (ratio of muscle mass to fat mass) is a key component to assessing overall fitness, rather than body weight alone. Increased fat mass, especially visceral belly fat, can put [place] at increased risk for metabolic syndrome, type 2 diabetes, heart disease, and even death. It is recommended to combine diet and exercise (cardiovascular and resistance training) to improve your body composition. Seek guidance from your physician and exercise physiologist before implementing an exercise routine.  Exercise Action Plan Clinical staff conducted group or individual video education with verbal and written material and guidebook.  Patient learns the recommended strategies to achieve and enjoy long-term exercise adherence, including variety, self-motivation, self-efficacy, and positive decision making. Benefits of exercise include fitness, good health, weight management, more energy, better sleep, less stress, and overall well-being.  Medical   Heart Disease Risk Reduction Clinical staff conducted group or individual video education with verbal and written material and guidebook.  Patient learns our heart is our most vital organ as it circulates oxygen, nutrients, white blood cells, and hormones throughout the entire body, and carries waste away. Data supports a plant-based eating plan like the Pritikin Program for its effectiveness in slowing progression of and reversing heart disease. The video  provides a number of recommendations to address heart disease.   Metabolic Syndrome and Belly Fat  Clinical staff conducted group or individual video education with verbal and written material and guidebook.  Patient learns what metabolic syndrome is, how it leads to heart disease, and how one can reverse it and keep it from coming back. You have metabolic syndrome if you have 3 of the following 5 criteria: abdominal obesity, high blood pressure, high triglycerides, low HDL cholesterol, and high blood sugar.  Hypertension and Heart Disease Clinical staff conducted group or individual video education with verbal and written material and guidebook.  Patient learns that high blood pressure, or hypertension,  is very common in the [place]. Hypertension is largely due to excessive salt intake, but other important risk factors include being overweight, physical inactivity, drinking too much alcohol, smoking, and not eating enough potassium from fruits and vegetables. High blood pressure is a leading risk factor for heart attack, stroke, congestive heart failure, dementia, kidney failure, and premature death. Long-term effects of excessive salt intake include stiffening of the arteries and thickening of heart muscle and organ damage. Recommendations include ways to reduce hypertension and the risk of heart disease.  Diseases of Our Time - Focusing on Diabetes Clinical staff conducted group or individual video education with verbal and written material and guidebook.  Patient learns why the best way to stop diseases of our time is prevention, through food and other lifestyle changes. Medicine (such as prescription pills and surgeries) is often only a Band-Aid on the problem, not a long-term solution. Most common diseases of our time include obesity, type 2 diabetes, hypertension, heart disease, and cancer. The Pritikin Program is recommended and has been proven to help reduce, reverse, and/or prevent the  damaging effects of metabolic syndrome.  Nutrition   Overview of the Pritikin Eating Plan  Clinical staff conducted group or individual video education with verbal and written material and guidebook.  Patient learns about the Pritikin Eating Plan for disease risk reduction. The Pritikin Eating Plan emphasizes a wide variety of unrefined, minimally-processed carbohydrates, like fruits, vegetables, whole grains, and legumes. Go, Caution, and Stop food choices are explained. Plant-based and lean animal proteins are emphasized. Rationale provided for low sodium intake for blood pressure control, low added sugars for blood sugar stabilization, and low added fats and oils for coronary artery disease risk reduction and weight management.  Calorie Density  Clinical staff conducted group or individual video education with verbal and written material and guidebook.  Patient learns about calorie density and how it impacts the Pritikin Eating Plan. Knowing the characteristics of the food you choose will help you decide whether those foods will lead to weight gain or weight loss, and whether you want to consume more or less of them. Weight loss is usually a side effect of the Pritikin Eating Plan because of its focus on low calorie-dense foods.  Label Reading  Clinical staff conducted group or individual video education with verbal and written material and guidebook.  Patient learns about the Pritikin recommended label reading guidelines and corresponding recommendations regarding calorie density, added sugars, sodium content, and whole grains.  Dining Out - Part 1  Clinical staff conducted group or individual video education with verbal and written material and guidebook.  Patient learns that restaurant meals can be sabotaging because they can be so high in calories, fat, sodium, and/or sugar. Patient learns recommended strategies on how to positively address this and avoid unhealthy pitfalls.  Facts on Fats   Clinical staff conducted group or individual video education with verbal and written material and guidebook.  Patient learns that lifestyle modifications can be just as effective, if not more so, as many medications for lowering your risk of heart disease. A Pritikin lifestyle can help to reduce your risk of inflammation and atherosclerosis (cholesterol build-up, or plaque, in the artery walls). Lifestyle interventions such as dietary choices and physical activity address the cause of atherosclerosis. A review of the types of fats and their impact on blood cholesterol levels, along with dietary recommendations to reduce fat intake is also included.  Nutrition Action Plan  Clinical staff conducted group or individual  video education with verbal and written material and guidebook.  Patient learns how to incorporate Pritikin recommendations into their lifestyle. Recommendations include planning and keeping personal health goals in mind as an important part of their success.  Healthy Mind-Set    Healthy Minds, Bodies, Hearts  Clinical staff conducted group or individual video education with verbal and written material and guidebook.  Patient learns how to identify when they are stressed. Video will discuss the impact of that stress, as well as the many benefits of stress management. Patient will also be introduced to stress management techniques. The way we think, act, and feel has an impact on our hearts.  How Our Thoughts Can Heal Our Hearts  Clinical staff conducted group or individual video education with verbal and written material and guidebook.  Patient learns that negative thoughts can cause depression and anxiety. This can result in negative lifestyle behavior and serious health problems. Cognitive behavioral therapy is an effective method to help control our thoughts in order to change and improve our emotional outlook.  Additional Videos:  Exercise    Improving Performance  Clinical  staff conducted group or individual video education with verbal and written material and guidebook.  Patient learns to use a non-linear approach by alternating intensity levels and lengths of time spent exercising to help burn more calories and lose more body fat. Cardiovascular exercise helps improve heart health, metabolism, hormonal balance, blood sugar control, and recovery from fatigue. Resistance training improves strength, endurance, balance, coordination, reaction time, metabolism, and muscle mass. Flexibility exercise improves circulation, posture, and balance. Seek guidance from your physician and exercise physiologist before implementing an exercise routine and learn your capabilities and proper form for all exercise.  Introduction to Yoga  Clinical staff conducted group or individual video education with verbal and written material and guidebook.  Patient learns about yoga, a discipline of the coming together of mind, breath, and body. The benefits of yoga include improved flexibility, improved range of motion, better posture and core strength, increased lung function, weight loss, and positive self-image. Yoga's heart health benefits include lowered blood pressure, healthier heart rate, decreased cholesterol and triglyceride levels, improved immune function, and reduced stress. Seek guidance from your physician and exercise physiologist before implementing an exercise routine and learn your capabilities and proper form for all exercise.  Medical   Aging: Enhancing Your Quality of Life  Clinical staff conducted group or individual video education with verbal and written material and guidebook.  Patient learns key strategies and recommendations to stay in good physical health and enhance quality of life, such as prevention strategies, having an advocate, securing a Health Care Proxy and Power of Attorney, and keeping a list of medications and system for tracking them. It also discusses how to  avoid risk for bone loss.  Biology of Weight Control  Clinical staff conducted group or individual video education with verbal and written material and guidebook.  Patient learns that weight gain occurs because we consume more calories than we burn (eating more, moving less). Even if your body weight is normal, you may have higher ratios of fat compared to muscle mass. Too much body fat puts you at increased risk for cardiovascular disease, heart attack, stroke, type 2 diabetes, and obesity-related cancers. In addition to exercise, following the Pritikin Eating Plan can help reduce your risk.  Decoding Lab Results  Clinical staff conducted group or individual video education with verbal and written material and guidebook.  Patient learns that lab test reflects  one measurement whose values change over time and are influenced by many factors, including medication, stress, sleep, exercise, food, hydration, pre-existing medical conditions, and more. It is recommended to use the knowledge from this video to become more involved with your lab results and evaluate your numbers to speak with your doctor.   Diseases of Our Time - Overview  Clinical staff conducted group or individual video education with verbal and written material and guidebook.  Patient learns that according to the CDC, 50% to 70% of chronic diseases (such as obesity, type 2 diabetes, elevated lipids, hypertension, and heart disease) are avoidable through lifestyle improvements including healthier food choices, listening to satiety cues, and increased physical activity.  Sleep Disorders Clinical staff conducted group or individual video education with verbal and written material and guidebook.  Patient learns how good quality and duration of sleep are important to overall health and well-being. Patient also learns about sleep disorders and how they impact health along with recommendations to address them, including discussing with a  physician.  Nutrition  Dining Out - Part 2 Clinical staff conducted group or individual video education with verbal and written material and guidebook.  Patient learns how to plan ahead and communicate in order to maximize their dining experience in a healthy and nutritious manner. Included are recommended food choices based on the type of restaurant the patient is visiting.   Fueling a Banker conducted group or individual video education with verbal and written material and guidebook.  There is a strong connection between our food choices and our health. Diseases like obesity and type 2 diabetes are very prevalent and are in large-part due to lifestyle choices. The Pritikin Eating Plan provides plenty of food and hunger-curbing satisfaction. It is easy to follow, affordable, and helps reduce health risks.  Menu Workshop  Clinical staff conducted group or individual video education with verbal and written material and guidebook.  Patient learns that restaurant meals can sabotage health goals because they are often packed with calories, fat, sodium, and sugar. Recommendations include strategies to plan ahead and to communicate with the manager, chef, or server to help order a healthier meal.  Planning Your Eating Strategy  Clinical staff conducted group or individual video education with verbal and written material and guidebook.  Patient learns about the Pritikin Eating Plan and its benefit of reducing the risk of disease. The Pritikin Eating Plan does not focus on calories. Instead, it emphasizes high-quality, nutrient-rich foods. By knowing the characteristics of the foods, we choose, we can determine their calorie density and make informed decisions.  Targeting Your Nutrition Priorities  Clinical staff conducted group or individual video education with verbal and written material and guidebook.  Patient learns that lifestyle habits have a tremendous impact on disease  risk and progression. This video provides eating and physical activity recommendations based on your personal health goals, such as reducing LDL cholesterol, losing weight, preventing or controlling type 2 diabetes, and reducing high blood pressure.  Vitamins and Minerals  Clinical staff conducted group or individual video education with verbal and written material and guidebook.  Patient learns different ways to obtain key vitamins and minerals, including through a recommended healthy diet. It is important to discuss all supplements you take with your doctor.   Healthy Mind-Set    Smoking Cessation  Clinical staff conducted group or individual video education with verbal and written material and guidebook.  Patient learns that cigarette smoking and tobacco addiction pose a serious  health risk which affects millions of people. Stopping smoking will significantly reduce the risk of heart disease, lung disease, and many forms of cancer. Recommended strategies for quitting are covered, including working with your doctor to develop a successful plan.  Culinary   Becoming a Set designer conducted group or individual video education with verbal and written material and guidebook.  Patient learns that cooking at home can be healthy, cost-effective, quick, and puts them in control. Keys to cooking healthy recipes will include looking at your recipe, assessing your equipment needs, planning ahead, making it simple, choosing cost-effective seasonal ingredients, and limiting the use of added fats, salts, and sugars.  Cooking - Breakfast and Snacks  Clinical staff conducted group or individual video education with verbal and written material and guidebook.  Patient learns how important breakfast is to satiety and nutrition through the entire day. Recommendations include key foods to eat during breakfast to help stabilize blood sugar levels and to prevent overeating at meals later in the day.  Planning ahead is also a key component.  Cooking - Educational psychologist conducted group or individual video education with verbal and written material and guidebook.  Patient learns eating strategies to improve overall health, including an approach to cook more at home. Recommendations include thinking of animal protein as a side on your plate rather than center stage and focusing instead on lower calorie dense options like vegetables, fruits, whole grains, and plant-based proteins, such as beans. Making sauces in large quantities to freeze for later and leaving the skin on your vegetables are also recommended to maximize your experience.  Cooking - Healthy Salads and Dressing Clinical staff conducted group or individual video education with verbal and written material and guidebook.  Patient learns that vegetables, fruits, whole grains, and legumes are the foundations of the Pritikin Eating Plan. Recommendations include how to incorporate each of these in flavorful and healthy salads, and how to create homemade salad dressings. Proper handling of ingredients is also covered. Cooking - Soups and [employer] - Soups and Desserts Clinical staff conducted group or individual video education with verbal and written material and guidebook.  Patient learns that Pritikin soups and desserts make for easy, nutritious, and delicious snacks and meal components that are low in sodium, fat, sugar, and calorie density, while high in vitamins, minerals, and filling fiber. Recommendations include simple and healthy ideas for soups and desserts.   Overview     The Pritikin Solution Program Overview Clinical staff conducted group or individual video education with verbal and written material and guidebook.  Patient learns that the results of the Pritikin Program have been documented in more than 100 articles published in peer-reviewed journals, and the benefits include reducing risk factors for  (and, in some cases, even reversing) high cholesterol, high blood pressure, type 2 diabetes, obesity, and more! An overview of the three key pillars of the Pritikin Program will be covered: eating well, doing regular exercise, and having a healthy mind-set.  WORKSHOPS  Exercise: Exercise Basics: Building Your Action Plan Clinical staff led group instruction and group discussion with PowerPoint presentation and patient guidebook. To enhance the learning environment the use of posters, models and videos may be added. At the conclusion of this workshop, patients will comprehend the difference between physical activity and exercise, as well as the benefits of incorporating both, into their routine. Patients will understand the FITT (Frequency, Intensity, Time, and Type) principle and how to use  it to build an exercise action plan. In addition, safety concerns and other considerations for exercise and cardiac rehab will be addressed by the presenter. The purpose of this lesson is to promote a comprehensive and effective weekly exercise routine in order to improve patients' overall level of fitness.   Managing Heart Disease: Your Path to a Healthier Heart Clinical staff led group instruction and group discussion with PowerPoint presentation and patient guidebook. To enhance the learning environment the use of posters, models and videos may be added.At the conclusion of this workshop, patients will understand the anatomy and physiology of the heart. Additionally, they will understand how Pritikin's three pillars impact the risk factors, the progression, and the management of heart disease.  The purpose of this lesson is to provide a high-level overview of the heart, heart disease, and how the Pritikin lifestyle positively impacts risk factors.  Exercise Biomechanics Clinical staff led group instruction and group discussion with PowerPoint presentation and patient guidebook. To enhance the learning  environment the use of posters, models and videos may be added. Patients will learn how the structural parts of their bodies function and how these functions impact their daily activities, movement, and exercise. Patients will learn how to promote a neutral spine, learn how to manage pain, and identify ways to improve their physical movement in order to promote healthy living. The purpose of this lesson is to expose patients to common physical limitations that impact physical activity. Participants will learn practical ways to adapt and manage aches and pains, and to minimize their effect on regular exercise. Patients will learn how to maintain good posture while sitting, walking, and lifting.  Balance Training and Fall Prevention  Clinical staff led group instruction and group discussion with PowerPoint presentation and patient guidebook. To enhance the learning environment the use of posters, models and videos may be added. At the conclusion of this workshop, patients will understand the importance of their sensorimotor skills (vision, proprioception, and the vestibular system) in maintaining their ability to balance as they age. Patients will apply a variety of balancing exercises that are appropriate for their current level of function. Patients will understand the common causes for poor balance, possible solutions to these problems, and ways to modify their physical environment in order to minimize their fall risk. The purpose of this lesson is to teach patients about the importance of maintaining balance as they age and ways to minimize their risk of falling.  WORKSHOPS   Nutrition:  Fueling a Ship broker led group instruction and group discussion with PowerPoint presentation and patient guidebook. To enhance the learning environment the use of posters, models and videos may be added. Patients will review the foundational principles of the Pritikin Eating Plan and understand  what constitutes a serving size in each of the food groups. Patients will also learn Pritikin-friendly foods that are better choices when away from home and review make-ahead meal and snack options. Calorie density will be reviewed and applied to three nutrition priorities: weight maintenance, weight loss, and weight gain. The purpose of this lesson is to reinforce (in a group setting) the key concepts around what patients are recommended to eat and how to apply these guidelines when away from home by planning and selecting Pritikin-friendly options. Patients will understand how calorie density may be adjusted for different weight management goals.  Mindful Eating  Clinical staff led group instruction and group discussion with PowerPoint presentation and patient guidebook. To enhance the learning environment the use  of posters, models and videos may be added. Patients will briefly review the concepts of the Pritikin Eating Plan and the importance of low-calorie dense foods. The concept of mindful eating will be introduced as well as the importance of paying attention to internal hunger signals. Triggers for non-hunger eating and techniques for dealing with triggers will be explored. The purpose of this lesson is to provide patients with the opportunity to review the basic principles of the Pritikin Eating Plan, discuss the value of eating mindfully and how to measure internal cues of hunger and fullness using the Hunger Scale. Patients will also discuss reasons for non-hunger eating and learn strategies to use for controlling emotional eating.  Targeting Your Nutrition Priorities Clinical staff led group instruction and group discussion with PowerPoint presentation and patient guidebook. To enhance the learning environment the use of posters, models and videos may be added. Patients will learn how to determine their genetic susceptibility to disease by reviewing their family history. Patients will gain  insight into the importance of diet as part of an overall healthy lifestyle in mitigating the impact of genetics and other environmental insults. The purpose of this lesson is to provide patients with the opportunity to assess their personal nutrition priorities by looking at their family history, their own health history and current risk factors. Patients will also be able to discuss ways of prioritizing and modifying the Pritikin Eating Plan for their highest risk areas  Menu  Clinical staff led group instruction and group discussion with PowerPoint presentation and patient guidebook. To enhance the learning environment the use of posters, models and videos may be added. Using menus brought in from [employer], or printed from [employer], patients will apply the Pritikin dining out guidelines that were presented in the [employer] video. Patients will also be able to practice these guidelines in a variety of provided scenarios. The purpose of this lesson is to provide patients with the opportunity to practice hands-on learning of the Pritikin Dining Out guidelines with actual menus and practice scenarios.  Label Reading Clinical staff led group instruction and group discussion with PowerPoint presentation and patient guidebook. To enhance the learning environment the use of posters, models and videos may be added. Patients will review and discuss the Pritikin label reading guidelines presented in Pritikin's Label Reading Educational series video. Using fool labels brought in from local grocery stores and markets, patients will apply the label reading guidelines and determine if the packaged food meet the Pritikin guidelines. The purpose of this lesson is to provide patients with the opportunity to review, discuss, and practice hands-on learning of the Pritikin Label Reading guidelines with actual packaged food labels. Cooking School  Pritikin's [employer] are  designed to teach patients ways to prepare quick, simple, and affordable recipes at home. The importance of nutrition's role in chronic disease risk reduction is reflected in its emphasis in the overall Pritikin program. By learning how to prepare essential core Pritikin Eating Plan recipes, patients will increase control over what they eat; be able to customize the flavor of foods without the use of added salt, sugar, or fat; and improve the quality of the food they consume. By learning a set of core recipes which are easily assembled, quickly prepared, and affordable, patients are more likely to prepare more healthy foods at home. These workshops focus on convenient breakfasts, simple entres, side dishes, and desserts which can be prepared with minimal effort and are consistent with nutrition recommendations  for cardiovascular risk reduction. Cooking [employer] are taught by a Armed forces logistics/support/administrative officer (RD) who has been trained by the [employer]. The chef or RD has a clear understanding of the importance of minimizing - if not completely eliminating - added fat, sugar, and sodium in recipes. Throughout the series of Cooking School Workshop sessions, patients will learn about healthy ingredients and efficient methods of cooking to build confidence in their capability to prepare    Cooking School weekly topics:  Adding Flavor- Sodium-Free  Fast and Healthy Breakfasts  Powerhouse Plant-Based Proteins  Satisfying Salads and Dressings  Simple Sides and Sauces  International Cuisine-Spotlight on the [employer] Zones  Delicious Desserts  Savory Soups  [employer] - Meals in a Astronomer Appetizers and Snacks  Comforting Weekend Breakfasts  One-Pot Wonders   Fast Evening Meals  Landscape architect Your Pritikin Plate  WORKSHOPS   Healthy Mindset (Psychosocial):  Focused Goals, Sustainable Changes Clinical staff led group instruction and group discussion  with PowerPoint presentation and patient guidebook. To enhance the learning environment the use of posters, models and videos may be added. Patients will be able to apply effective goal setting strategies to establish at least one personal goal, and then take consistent, meaningful action toward that goal. They will learn to identify common barriers to achieving personal goals and develop strategies to overcome them. Patients will also gain an understanding of how our mind-set can impact our ability to achieve goals and the importance of cultivating a positive and growth-oriented mind-set. The purpose of this lesson is to provide patients with a deeper understanding of how to set and achieve personal goals, as well as the tools and strategies needed to overcome common obstacles which may arise along the way.  From Head to Heart: The Power of a Healthy Outlook  Clinical staff led group instruction and group discussion with PowerPoint presentation and patient guidebook. To enhance the learning environment the use of posters, models and videos may be added. Patients will be able to recognize and describe the impact of emotions and mood on physical health. They will discover the importance of self-care and explore self-care practices which may work for them. Patients will also learn how to utilize the 4 C's to cultivate a healthier outlook and better manage stress and challenges. The purpose of this lesson is to demonstrate to patients how a healthy outlook is an essential part of maintaining good health, especially as they continue their cardiac rehab journey.  Healthy Sleep for a Healthy Heart Clinical staff led group instruction and group discussion with PowerPoint presentation and patient guidebook. To enhance the learning environment the use of posters, models and videos may be added. At the conclusion of this workshop, patients will be able to demonstrate knowledge of the importance of sleep to overall  health, well-being, and quality of life. They will understand the symptoms of, and treatments for, common sleep disorders. Patients will also be able to identify daytime and nighttime behaviors which impact sleep, and they will be able to apply these tools to help manage sleep-related challenges. The purpose of this lesson is to provide patients with a general overview of sleep and outline the importance of quality sleep. Patients will learn about a few of the most common sleep disorders. Patients will also be introduced to the concept of "sleep hygiene," and discover ways to self-manage certain sleeping problems through simple daily behavior changes. Finally, the workshop will motivate patients by clarifying  the links between quality sleep and their goals of heart-healthy living.   Recognizing and Reducing Stress Clinical staff led group instruction and group discussion with PowerPoint presentation and patient guidebook. To enhance the learning environment the use of posters, models and videos may be added. At the conclusion of this workshop, patients will be able to understand the types of stress reactions, differentiate between acute and chronic stress, and recognize the impact that chronic stress has on their health. They will also be able to apply different coping mechanisms, such as reframing negative self-talk. Patients will have the opportunity to practice a variety of stress management techniques, such as deep abdominal breathing, progressive muscle relaxation, and/or guided imagery.  The purpose of this lesson is to educate patients on the role of stress in their lives and to provide healthy techniques for coping with it.  Learning Barriers/Preferences:  Learning Barriers/Preferences - 01/19/23 1051       Learning Barriers/Preferences   Learning Barriers Sight   glasses   Learning Preferences Audio;Computer/Internet;Group Instruction;Individual Instruction;Pictoral;Skilled Demonstration;Verbal  Instruction;Video;Written Material             Education Topics:  Knowledge Questionnaire Score:  Knowledge Questionnaire Score - 01/19/23 1051       Knowledge Questionnaire Score   Pre Score 19/24             Core Components/Risk Factors/Patient Goals at Admission:  Personal Goals and Risk Factors at Admission - 01/19/23 1011       Core Components/Risk Factors/Patient Goals on Admission    Weight Management Yes;Weight Maintenance    Intervention Weight Management: Develop a combined nutrition and exercise program designed to reach desired caloric intake, while maintaining appropriate intake of nutrient and fiber, sodium and fats, and appropriate energy expenditure required for the weight goal.;Weight Management: Provide education and appropriate resources to help participant work on and attain dietary goals.    Expected Outcomes Long Term: Adherence to nutrition and physical activity/exercise program aimed toward attainment of established weight goal;Weight Maintenance: Understanding of the daily nutrition guidelines, which includes 25-35% calories from fat, 7% or less cal from saturated fats, less than 200mg  cholesterol, less than 1.5gm of sodium, & 5 or more servings of fruits and vegetables daily;Understanding recommendations for meals to include 15-35% energy as protein, 25-35% energy from fat, 35-60% energy from carbohydrates, less than 200mg  of dietary cholesterol, 20-35 gm of total fiber daily;Understanding of distribution of calorie intake throughout the day with the consumption of 4-5 meals/snacks;Short Term: Continue to assess and modify interventions until short term weight is achieved    Hypertension Yes    Intervention Provide education on lifestyle modifcations including regular physical activity/exercise, weight management, moderate sodium restriction and increased consumption of fresh fruit, vegetables, and low fat dairy, alcohol moderation, and smoking  cessation.;Monitor prescription use compliance.    Expected Outcomes Short Term: Continued assessment and intervention until BP is < 140/67mm HG in hypertensive participants. < 130/34mm HG in hypertensive participants with diabetes, heart failure or chronic kidney disease.;Long Term: Maintenance of blood pressure at goal levels.    Lipids Yes    Intervention Provide education and support for participant on nutrition & aerobic/resistive exercise along with prescribed medications to achieve LDL 70mg , HDL >40mg .    Expected Outcomes Short Term: Participant states understanding of desired cholesterol values and is compliant with medications prescribed. Participant is following exercise prescription and nutrition guidelines.;Long Term: Cholesterol controlled with medications as prescribed, with individualized exercise RX and with personalized nutrition plan. Value goals:  LDL < 70mg , HDL > 40 mg.    Stress --    Intervention --    Expected Outcomes --             Core Components/Risk Factors/Patient Goals Review:    Core Components/Risk Factors/Patient Goals at Discharge (Final Review):    ITP Comments:  ITP Comments     Row Name 01/19/23 0942           ITP Comments Armanda Magic, MD: Introduction to the Pritikin Educatrion Program/Intensive Cardiac Rehab.  Initial orientation packet reviewed with the patient.                Comments: Participant attended orientation for the cardiac rehabilitation program on  01/19/2023  to perform initial intake and exercise walk test. Patient introduced to the Pritikin Program education and orientation packet was reviewed. Completed 6-minute walk test, measurements, initial ITP, and exercise prescription. Vital signs stable. Telemetry-normal sinus rhythm, mildly symptomatic. Pt dizzy when standing to leave room for , RN Byrd Hesselbach notified. See note for details.   Service time was from 0930 to 1155.   Jonna Coup, MS, ACSM-CEP 01/19/2023 1:16  PM

## 2023-01-19 NOTE — Progress Notes (Signed)
Patient reported feeling lightheaded upon standing at cardiac rehab orientation heart rate 62 sinus rhythm. Patient given water. Sitting blood pressure 122/68. Standing blood pressure 102/60. Patient was more  given water. Ate breakfast this morning had chex cereal juice and coffee. Given water. Will notify onsite provider Carlos Levering DNP.  Carlos Levering DNP  notified.  As long as no orthostatic with recheck of sit to stand and standing 3 minutes okay to proceed   Repeat sitting blood pressure 122/62. Standing BP 122/68. Symptoms resolved. Patient encouraged to make sure that she hydrates adequately before coming to exercise.  Will forward today's vital signs to Gillian Shields NP for review. The patient reports that she feels fatigued all the time. Completed 6 minute walk test without symptoms.Thayer Headings RN BSN

## 2023-01-19 NOTE — Progress Notes (Signed)
Cardiac Rehab Medication Review by a Nurse  Does the patient  feel that his/her medications are working for him/her?  yes  Has the patient been experiencing any side effects to the medications prescribed?  Sandra Wheeler and her husband Sandra Wheeler says that she is had to stop taking aricept due to bradycardia concerns.   Does the patient measure his/her own blood pressure or blood glucose at home?  yes   Does the patient have any problems obtaining medications due to transportation or finances?   no  Understanding of regimen:  patient has a memory deficit Understanding of indications:  patient has a memory deficit Potential of compliance: excellent    Nurse comments: Patient's husband help her with her medications. Patient has a BP cuff/monitor. Sandra Wheeler's BP cuff is not working at this time. Patient has difficulty with her words not that aricept has been discontinued.    Arta Bruce [facility] RN 01/19/2023 10:17 AM

## 2023-01-20 NOTE — Telephone Encounter (Signed)
-----   Message from Sandra Wheeler sent at 01/20/2023  9:13 AM EDT ----- With her recent monitor showing VT and SVT I would like to keep her on at least her small dose of Metoprolol (presently on 12.5mg ). Her fatigue predates the addition of beta blocker and I anticipate it is likely related to deconditioning. Agree with the recommendation for increasing her hydration - thanks for taking such good care of her & all the cardiac rehab participants. If she has recurrent issues let me know and we can always readdress.  Hope you have a wonderful week!  Caitlin ----- Message ----- From: Cammy Copa, RN Sent: 01/19/2023   1:25 PM EDT To: Sandra Sorrow, NP  Good afternoon Luther Parody,  [name] attended cardiac orientation rehab this morning and had some orthostatic changes. She was given water symptoms resolved. Repeat BP improved. Patient was able to complete 6 minute walk test without symptoms.    Repeat sitting blood pressure 122/62. Standing BP 122/68. Symptoms resolved. Patient encouraged to make sure that she hydrates adequately before coming to exercise. Carlos Levering NP our onsite provider asked me to reach out to you about her medications. [name] says she feels fatigued all the time. Could this be coming from the betablocker?  Darenda's resting heart rate is in the 50's. She also is somewhat unsteady with her gait. She will use an assistive device here. I tired to encourage her to consider using a cane when she goes out. I will keep talking to her and her husband.  Thank you for your input! Sincerely, [facility]  Cardiac Rehab

## 2023-01-24 ENCOUNTER — Ambulatory Visit (INDEPENDENT_AMBULATORY_CARE_PROVIDER_SITE_OTHER): Payer: Medicare Other | Admitting: Audiology

## 2023-01-24 DIAGNOSIS — R42 Dizziness and giddiness: Secondary | ICD-10-CM | POA: Diagnosis not present

## 2023-01-24 NOTE — Progress Notes (Signed)
[address] [place], [place] [address] [phone]  Vestibular Evaluation    Name: Sandra Wheeler     DOB:   1940-04-15      MRN:   [id number]                                                                                     Service Date: 01/24/2023        Case History:  Patient is an 82 year old female who was seen for a vestibular assessment as ordered by Dr. Janeece Riggers Philomena Doheny. She described her dizziness as a swimming sensation, imbalance, and motion sickness. Associated symptoms include headache, nausea, sweating, confusion, and falls. Her intermittent episodes reportedly occur weekly. Exacerbating factors include looking up, sitting to standing, walking, and uneven surfaces. Currently, she manages her dizziness with Meclizine, which, in her opinion, helps. Additional medical history pertinent to the vestibular assessment includes hypertension, irregular heartbeat, heart attack 2 months ago, left loss of vision, cataracts, and glaucoma.   Biomedical scientist (SOP) Test: Romberg (Conditions 1-2): WNL  Tandem Romberg (Conditions 3-4): WNL  CTSIB (Conditions 5-6): Sway Fukuda (Condition 7): WNL   Vertebral Artery Screening Test (VAST): VAST Right: Negative VAST Left: Negative   Electrophysiology: Auditory Brainstem Response (ABR): WNL AU  Electrocochleography (ECochG): Inconclusive AD; Negative AS Cervical Vestibular-Evoked Myogenic Potential (cVEMP): Present AU    Videonystagmography (VNG): Ocular Motility Tests Gaze: WNL  Smooth Pursuit: Low gain  Saccades: Slow velocity and low accuracy OPK: WNL    Spontaneous Nystagmus Sitting: Square Wave Jerks Supine: Square Wave Jerks [employer] (Active): Square Wave Jerks   Positional Tests Head Right: Square Wave Jerks Head Left: Square Wave Jerks Body Right: DNT Body Left: DNT    Positioning Tests Head Hanging Right: Square Wave Jerks Head Hanging Left: WNL Dix-Hallpike/Side Lying Right:  Negative for BPPV Dix-Hallpike/Side Lying Left: Negative for BPPV   Bithermal Air Calorics Unilateral Weakness: 22% (<25% WNL), Right Directional Preponderance: 5% (<30% WNL), Left Fixation Index: Normal   Rotary Chair:  Sinusoidal Harmonic Acceleration (SHA)  Frequencies Tested: 0.01, 0.08, 0.64 Hz  Gain: WNL Phase: WNL Symmetry: WNL    Step Velocity Test (SVT)  Gain: WNL  Time Constant: WNL  Time Constant Asymmetry: WNL     Conclusions: Abnormal vestibular assessment with central indicators.   Abnormal [facility] Test results suggest an imbalance between visual, somatosensory, and vestibular inputs for dynamic postural control. Vertebral Artery Screening Test was negative, bilaterally, for vertebral artery insufficiency.  Normal ABR results in both ears suggest good function of the cochlear nerve in both ears. Inconclusive ECochG results in the right ear may be attributed to poor morphology and replicability of waveform components. Elevated labyrinthine pressure, or endolymphatic hydrops, cannot be ruled out in the right ear at this time. Normal ECochG results in the left ear do not suggest elevated labyrinthine pressure, or endolymphatic hydrops, in the left ear.  Normal cVEMP results in both ears suggest intact function of the saccule and inferior vestibular nerve. Abnormal ocular motility test results suggest central involvement. No clinically significant nystagmus or BPPV observed during positional and positioning tests. Normal head shake test results  do not suggest an imbalance of vestibular inputs. Consistent square wave jerks throughout testing suggest central involvement. Normal bithermal air calorics results do not suggest a unilateral weakness or bilateral weakness affecting the lateral semicircular canal and/or superior vestibular nerve in either ear. Normal SHA and SVT results rule out bilateral and unilateral peripheral impairment and suggest good function of the VOR with  dynamic motion in the horizontal plane.     Recommendations: 1. Follow-up appointment with Dr. Amanda Pea Teoh for further discussion and recommendations. 2. Repeat testing as medically necessary or should further concerns arise.   Audiologist: Claudette Laws, AuD, CCC-A Date of Service: 01/24/2023

## 2023-01-25 MED ORDER — CLOPIDOGREL BISULFATE 75 MG PO TABS: 75.0000 mg | ORAL_TABLET | Freq: Every day | ORAL | 3 refills | Status: DC

## 2023-01-25 NOTE — Addendum Note (Signed)
Addended by: Regis Bill B on: 01/25/2023 09:38 AM   Modules accepted: Orders

## 2023-01-27 ENCOUNTER — Encounter (HOSPITAL_COMMUNITY)
Admission: RE | Admit: 2023-01-27 | Discharge: 2023-01-27 | Disposition: A | Discharge status: Home / Self Care | Payer: Medicare Other | Source: Ambulatory Visit | Attending: Cardiovascular Disease | Admitting: Cardiovascular Disease

## 2023-01-27 DIAGNOSIS — Z955 Presence of coronary angioplasty implant and graft: Secondary | ICD-10-CM | POA: Insufficient documentation

## 2023-01-27 DIAGNOSIS — I214 Non-ST elevation (NSTEMI) myocardial infarction: Secondary | ICD-10-CM | POA: Insufficient documentation

## 2023-01-27 NOTE — Progress Notes (Signed)
Incomplete Session Note  Patient Details  Name: Sandra Wheeler MRN: [id number] Date of Birth: Aug 25, 1940 Referring Provider:   Flowsheet Row INTENSIVE CARDIAC REHAB ORIENT from 01/19/2023 in [facility]  Referring Provider Chilton Si, MD       [name] did not complete her rehab session.     Patient said that she fell at home last night getting off the couch. Patient said she fell and hurt her left side. Patient said that she fell on her back and left side. BP 138/82. Hear rate 57. Oxygen saturation. I advised [name] that she not exercise today due to her fall. Patient reports having bruising. [name] did not hit her head. Appointment obtained for [name] [facility] [name] has an appointment on Monday at 1:20 pm. Will cancel Monday's exercise appointment. [name] will need clearance to return to exercise at cardiac rehab. Patient and husband instructed to go to the ED is necessary. Patient and husband. State understanding.Thayer Headings RN BSN

## 2023-01-27 NOTE — Progress Notes (Deleted)
Patient said that she fell at home last night getting off the couch. Patient said she fell and hurt her left side. Patient said that she fell on her back and left side. BP 138/82. Hear rate 57. Oxygen saturation. I advised Breion that she not exercise today due to her fall. Patient reports having bruising. Charley did not hit her head. Appointment obtained for [name] [facility] [name] has an appointment on Monday at 1:20 pm. Will cancel Monday's exercise appointment. Lanier will need clearance to return to exercise at cardiac rehab. Patient and husband instructed to go to the ED is necessary. Patient and husband. State understanding.Thayer Headings RN BSN

## 2023-01-30 ENCOUNTER — Encounter: Payer: Self-pay | Admitting: Orthopedic Surgery

## 2023-01-30 ENCOUNTER — Encounter (HOSPITAL_COMMUNITY): Payer: Medicare Other

## 2023-01-30 ENCOUNTER — Non-Acute Institutional Stay: Payer: Medicare Other | Admitting: Orthopedic Surgery

## 2023-01-30 VITALS — BP 118/62 | HR 58 | Temp 98.5°F | Resp 15 | Ht 62.0 in | Wt 130.6 lb

## 2023-01-30 DIAGNOSIS — I25118 Atherosclerotic heart disease of native coronary artery with other forms of angina pectoris: Secondary | ICD-10-CM | POA: Diagnosis not present

## 2023-01-30 DIAGNOSIS — M25552 Pain in left hip: Secondary | ICD-10-CM

## 2023-01-30 NOTE — Progress Notes (Signed)
Location:   [facility]   Place of Service:   [facility] Provider:  Octavia Heir, NP   Mahlon Gammon, MD  Patient Care Team: Mahlon Gammon, MD as PCP - General (Internal Medicine) Chilton Si, MD as PCP - Cardiology (Cardiology) Chilton Si, MD as Consulting Physician (Cardiology) Genia Del, MD as Consulting Physician (Obstetrics and Gynecology) Elmon Else, MD as Consulting Physician (Dermatology) Antony Contras, MD as Consulting Physician (Ophthalmology)  Extended Emergency Contact Information Primary Emergency Contact: Randon,Richard Address: [phone]          [place], [place] [address] [place] of [place] Home Phone: [phone] Mobile Phone: [phone] Relation: Spouse Secondary Emergency Contact: Powell,Karen Mobile Phone: [phone] Relation: Daughter Preferred language: English Interpreter needed? No  Code Status:  Full code Goals of care: Advanced Directive information    12/14/2022    8:10 AM  Advanced Directives  Does Patient Have a Medical Advance Directive? Yes  Type of Estate agent of [place];Living will;Out of facility DNR (pink MOST or yellow form)  Does patient want to make changes to medical advance directive? No - Patient declined  Copy of Healthcare Power of Attorney in Chart? No - copy requested     No chief complaint on file.   HPI:  Pt is a 82 y.o. female seen today for acute visit due to left hip pain/bruising.   11/06 fell at cardiology rehab visit. She was going to sit down when she mistook chair and fell on bottom/left side. She had increased bruising to left lateral hip after incident. She is taking aspirin and plavix. Since fall, she has been able to ambulate without difficulty. Denies pain when walking. Mild pain when laying on left side.   She has been working with cardiac rehab since 11/2022. 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 Husband requesting to transition to PT/OT services at [facility].    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/25/2019   Cataract    unsure of which eye   Colon polyps    Environmental allergies    Essential hypertension 06/25/2019   Genetic testing 04/28/2017   Multi-Cancer panel (83 genes) @ [facility] - No pathogenic mutations detected   GERD (gastroesophageal reflux disease)    Glaucoma    High cholesterol    Hypertension    IBS (irritable bowel syndrome)    Memory change    Osteoporosis 06/2016   T score -2.8   PONV (postoperative nausea and vomiting)    Pure hypercholesterolemia 06/25/2019   Seizures (HCC)    childhood, 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   Past Surgical History:  Procedure Laterality Date   ABDOMINAL HYSTERECTOMY     BLADDER SUSPENSION N/A 04/08/2016   Procedure: TRANSVAGINAL TAPE (TVT) PROCEDURE;  Surgeon: Genia Del, MD;  Location: [facility];  Service: Gynecology;  Laterality: N/A;   BREAST SURGERY     cyst removal   CARDIAC CATHETERIZATION     CATARACT EXTRACTION, BILATERAL     COLONOSCOPY     CORONARY STENT INTERVENTION N/A 12/08/2022   Procedure: CORONARY STENT INTERVENTION;  Surgeon: Corky Crafts, MD;  Location: [facility] INVASIVE CV LAB;  Service: Cardiovascular;  Laterality: N/A;   CYSTOSCOPY N/A 04/08/2016   Procedure: CYSTOSCOPY;  Surgeon: Genia Del, MD;  Location: [facility];  Service: Gynecology;  Laterality: N/A;   DENTAL SURGERY  03/31/2016   ELBOW FRACTURE SURGERY     EYE SURGERY Left    blood clot on retina   LEFT HEART CATH AND CORONARY ANGIOGRAPHY N/A 12/08/2022   Procedure: LEFT HEART CATH AND CORONARY ANGIOGRAPHY;  Surgeon: Corky Crafts, MD;  Location: [facility] INVASIVE CV LAB;  Service: Cardiovascular;  Laterality: N/A;   ROBOTIC ASSISTED TOTAL HYSTERECTOMY WITH BILATERAL SALPINGO  OOPHERECTOMY Bilateral 04/08/2016   Procedure: ROBOTIC ASSISTED TOTAL HYSTERECTOMY WITH BILATERAL SALPINGO OOPHORECTOMY/Uterosacral Ligament Suspension;  Surgeon: Genia Del, MD;  Location: [facility];  Service: Gynecology;  Laterality: Bilateral;  Request 4 hrs.   TUBAL LIGATION     WRIST ARTHROSCOPY      Allergies  Allergen Reactions   Codeine Other (See Comments)    Knocked her out Other reaction(s): Unknown   Meloxicam Other (See Comments)    BLURRY VISION, DIZZY, CONFUSION  Other reaction(s): Unknown   Sulfamethoxazole-Trimethoprim     Unknown reaction   Tramadol Other (See Comments)    CONFUSION     Outpatient Encounter Medications as of 01/30/2023  Medication Sig   alendronate (FOSAMAX) 70 MG tablet TAKE 1 TABLET BY MOUTH EVERY 7 DAYS WITH A FULL GLASS OF WATER ON AN EMPTY STOMACH   aspirin 81 MG chewable tablet Chew 1 tablet (81 mg total) by mouth daily.   calcium carbonate (CALCIUM 600) 600 MG TABS tablet Take 600 mg by mouth daily with breakfast.   clopidogrel (PLAVIX) 75 MG tablet Take 1 tablet (75 mg total) by mouth daily.   ezetimibe (ZETIA) 10 MG tablet Take 1 tablet (10 mg total) by mouth daily.   Latanoprost PF 0.005 % SOLN Place 1 drop into both eyes at bedtime.   metoprolol succinate (TOPROL-XL) 25 MG 24 hr tablet Take 0.5 tablets (12.5 mg total) by mouth daily. Take with or immediately following a meal.   Multiple Vitamin (MULTIVITAMIN WITH MINERALS) TABS tablet Take 1 tablet by mouth daily.   pravastatin (PRAVACHOL) 40 MG tablet Take 1 tablet (40 mg total) by mouth every evening.   valsartan (DIOVAN) 80 MG tablet Take 1 tablet (80 mg total) by mouth daily. Please schedule appointment with Dr. Duke Salvia for refills.   No facility-administered encounter medications on file as of 01/30/2023.    Review of Systems  Constitutional:  Negative for activity change and appetite change.  Respiratory:  Negative for cough.   Cardiovascular:  Negative for chest pain and  leg swelling.  Musculoskeletal:  Positive for arthralgias. Negative for gait problem.  Skin:  Positive for color change.  Neurological:  Negative for dizziness, weakness and light-headedness.  Psychiatric/Behavioral:  Negative for dysphoric mood. The patient is not nervous/anxious.     Immunization History  Administered Date(s) Administered   Fluad Quad(high Dose 65+) 12/17/2021   Influenza Split 10/20/2007, 12/04/2019   Influenza,inj,Quad PF,6+ Mos 12/06/2017   Influenza-Unspecified 01/06/2017, 11/19/2020   Moderna SARS-COV2 Booster Vaccination 11/19/2020   PFIZER Comirnaty(Gray Top)Covid-19 Tri-Sucrose Vaccine 12/17/2021   PFIZER(Purple Top)SARS-COV-2 Vaccination 04/27/2019, 05/22/2019, 12/13/2019   Pneumococcal Conjugate-13 03/27/2014   Pneumococcal Polysaccharide-23 09/18/2012   Tdap 11/19/2009, 07/06/2019   Zoster Recombinant(Shingrix) 11/04/2021, 01/06/2022   Zoster, Live 10/20/2007, 11/04/2016, 11/30/2016, 01/06/2017   Pertinent  Health Maintenance Due  Topic Date Due   INFLUENZA VACCINE  10/20/2022   DEXA SCAN  Completed      09/14/2022    9:05 AM 11/30/2022    8:26 AM 12/14/2022  8:10 AM 01/19/2023   10:47 AM 01/27/2023    2:55 PM  Fall Risk  Falls in the past year? 0 1 1 1 1   Was there an injury with Fall? 0 1 1 1 1   Fall Risk Category Calculator 0 3 2 3 3   Patient at Risk for Falls Due to No Fall Risks Impaired balance/gait;History of fall(s) History of fall(s);Impaired balance/gait;Impaired mobility History of fall(s);Impaired balance/gait;Mental status change;Impaired vision History of fall(s);Impaired balance/gait;Impaired mobility;Mental status change;Impaired vision  Fall risk Follow up Falls evaluation completed Falls evaluation completed Falls evaluation completed Falls evaluation completed Falls evaluation completed   Functional Status Survey:    Vitals:   01/30/23 1322  BP: 118/62  Pulse: (!) 58  Resp: 15  Temp: 98.5 F (36.9 C)  SpO2: 95%   Weight: 130 lb 9.6 oz (59.2 kg)  Height: 5\' 2"  (1.575 m)   Body mass index is 23.89 kg/m. Physical Exam Vitals reviewed.  Constitutional:      General: She is not in acute distress. HENT:     Head: Normocephalic.  Eyes:     General:        Right eye: No discharge.        Left eye: No discharge.  Cardiovascular:     Rate and Rhythm: Normal rate and regular rhythm.     Pulses: Normal pulses.     Heart sounds: Normal heart sounds.  Pulmonary:     Effort: Pulmonary effort is normal.     Breath sounds: Normal breath sounds.  Musculoskeletal:     Cervical back: Neck supple.     Left hip: Tenderness present. No deformity or crepitus. Normal range of motion. Normal strength.     Right lower leg: No edema.     Left lower leg: No edema.     Comments: Left hip FROM. Able to ambulate without difficulty. Able to move extremities without difficulty.   Skin:    General: Skin is warm.     Capillary Refill: Capillary refill takes less than 2 seconds.     Findings: Bruising present.     Comments: Ecchymosis to left lateral hip, bluish/yellow, mild tenderness, no deformity, hematoma or skin breakdown.    Neurological:     General: No focal deficit present.     Mental Status: She is alert and oriented to person, place, and time.     Motor: No weakness.     Gait: Gait normal.  Psychiatric:        Mood and Affect: Mood normal.     Labs reviewed: Recent Labs    04/12/22 1043 06/16/22 0800 11/03/22 1220 11/04/22 0447 12/07/22 2152 12/08/22 0620 12/09/22 0830  NA 141   < >  --    < > 137 141 138  K 4.6   < >  --    < > 3.8 3.3* 3.6  CL 102   < >  --    < > 108 108 109  CO2 27   < >  --    < > 20* 20* 19*  GLUCOSE 93   < >  --    < > 112* 93 99  BUN 16   < >  --    < > 20 15 12   CREATININE 0.86   < >  --    < > 0.75 0.72 0.78  CALCIUM 10.2   < >  --    < > 8.3* 8.0* 8.3*  MG 2.3  --  2.0  --   --   --   --    < > = values in this interval not displayed.   Recent Labs     04/12/22 1043 06/16/22 0800 11/03/22 1015 12/07/22 2152  AST 21 20 18 27   ALT 18 20 15 16   ALKPHOS 58  --  33* 41  BILITOT 0.3 0.4 0.5 0.5  PROT 6.9 6.5 6.0* 6.1*  ALBUMIN 4.5  --  3.3* 3.2*   Recent Labs    06/16/22 0800 11/03/22 1015 11/04/22 0447 12/07/22 2152 12/08/22 0620 12/09/22 0830  WBC 4.5 5.0   < > 8.5 5.6 5.7  NEUTROABS 2,916 3.4  --  6.4  --   --   HGB 12.9 12.5   < > 12.5 11.5* 11.3*  HCT 37.9 37.0   < > 37.7 35.8* 33.6*  MCV 95.0 95.6   < > 97.2 98.6 95.7  PLT 277 249   < > 288 233 246   < > = values in this interval not displayed.   Lab Results  Component Value Date   TSH 3.349 11/03/2022   No results found for: "HGBA1C" Lab Results  Component Value Date   CHOL 150 06/16/2022   HDL 52 06/16/2022   LDLCALC 75 06/16/2022   LDLDIRECT 145 (H) 04/12/2022   TRIG 157 (H) 06/16/2022   CHOLHDL 2.9 06/16/2022    Significant Diagnostic Results in last 30 days:  No results found.  Assessment/Plan 1. Left hip pain - 11/06 fall while at cardiac rehab - missed chair and landed on buttocks/left hip - exam unremarkable> left hip FROM - mild bruising to left lateral  - no concerns for fracture, suspect bruising only - recommend tylenol [phone] mg 3x/daily "as needed" for pain - contact PCP if symptoms worsen or do not improve  2. Coronary artery disease of native artery of native heart with stable angina pectoris (HCC) - followed by cardiology - s/p NSTEMI with hospitalization 09/18-09/20 - plan for DAPT x 12 months (plavix + asa) - cont statin, asa, metoprolol, zetia - husbands asking to transition to PT/OT at [facility] I will reach out to cardiology    Family/ staff Communication: plan discussed with patient and husband  Labs/tests ordered:  none

## 2023-01-30 NOTE — Patient Instructions (Signed)
Please contact provider if left hip pain worsens or does not resolve  You may take tylenol [phone] mg 3x/daily " as needed" for pain

## 2023-01-31 ENCOUNTER — Ambulatory Visit (INDEPENDENT_AMBULATORY_CARE_PROVIDER_SITE_OTHER): Payer: Medicare Other

## 2023-01-31 ENCOUNTER — Other Ambulatory Visit (INDEPENDENT_AMBULATORY_CARE_PROVIDER_SITE_OTHER): Payer: Self-pay | Admitting: Otolaryngology

## 2023-01-31 DIAGNOSIS — R42 Dizziness and giddiness: Secondary | ICD-10-CM

## 2023-01-31 NOTE — Progress Notes (Signed)
Hello,   This should be labeled as a vestibular report.  Thank You!

## 2023-02-01 ENCOUNTER — Encounter (HOSPITAL_COMMUNITY): Payer: Medicare Other

## 2023-02-01 ENCOUNTER — Encounter: Payer: Medicare Other | Admitting: Internal Medicine

## 2023-02-03 ENCOUNTER — Encounter (HOSPITAL_COMMUNITY): Payer: Medicare Other

## 2023-02-03 ENCOUNTER — Telehealth (HOSPITAL_COMMUNITY): Payer: Self-pay | Admitting: *Deleted

## 2023-02-03 NOTE — Telephone Encounter (Signed)
Left message to call cardiac rehab.Shaynah Hund Walden Kinzi Frediani RN BSN  

## 2023-02-06 ENCOUNTER — Encounter (HOSPITAL_COMMUNITY): Payer: Medicare Other

## 2023-02-06 ENCOUNTER — Telehealth (INDEPENDENT_AMBULATORY_CARE_PROVIDER_SITE_OTHER): Payer: Self-pay | Admitting: Otolaryngology

## 2023-02-06 NOTE — Telephone Encounter (Signed)
Left vm, waiting on confirmation

## 2023-02-07 ENCOUNTER — Ambulatory Visit (INDEPENDENT_AMBULATORY_CARE_PROVIDER_SITE_OTHER): Payer: Medicare Other | Admitting: Otolaryngology

## 2023-02-07 ENCOUNTER — Encounter (INDEPENDENT_AMBULATORY_CARE_PROVIDER_SITE_OTHER): Payer: Self-pay

## 2023-02-07 VITALS — Ht 62.0 in | Wt 140.0 lb

## 2023-02-07 DIAGNOSIS — H903 Sensorineural hearing loss, bilateral: Secondary | ICD-10-CM | POA: Diagnosis not present

## 2023-02-07 DIAGNOSIS — H8190 Unspecified disorder of vestibular function, unspecified ear: Secondary | ICD-10-CM | POA: Diagnosis not present

## 2023-02-07 DIAGNOSIS — R42 Dizziness and giddiness: Secondary | ICD-10-CM

## 2023-02-08 ENCOUNTER — Encounter (HOSPITAL_COMMUNITY): Payer: Medicare Other

## 2023-02-08 DIAGNOSIS — H903 Sensorineural hearing loss, bilateral: Secondary | ICD-10-CM | POA: Insufficient documentation

## 2023-02-08 DIAGNOSIS — R42 Dizziness and giddiness: Secondary | ICD-10-CM | POA: Insufficient documentation

## 2023-02-08 NOTE — Progress Notes (Signed)
Patient ID: Sandra Wheeler, female   DOB: 09/19/1940, 82 y.o.   MRN: [id number]  Follow-up: Recurrent dizziness  HPI: The patient is an 82 year old female who returns today for her follow-up evaluation.  She was previously seen for recurrent dizziness.  She has been symptomatic since March 2024.  Her dizziness was described as a lightheaded and off-balance sensation.  At her last visit in September 2024, she was noted to have bilateral symmetric high-frequency sensorineural hearing loss.  The patient is blind in the left eye.  She subsequently underwent a vestibular neurodiagnostic testing.  She was noted to have abnormal vestibular assessment, with findings consistent with central vestibular dysfunction.  The result showed imbalance between visual, somatosensory, and vestibular inputs for dynamic postural control.  No significant peripheral vestibular weakness was noted.  The patient returns today reporting improvement in her dizziness over the past month.  She has not had any significant lightheaded sensation over the past 2 weeks.  She denies any recent change in her hearing.  Exam: General: Communicates without difficulty, well nourished, no acute distress. Head: Normocephalic, no evidence injury, no tenderness, facial buttresses intact without stepoff. Face/sinus: No tenderness to palpation and percussion. Facial movement is normal and symmetric. Eyes: PERRL, EOMI. No scleral icterus, conjunctivae clear. Neuro: CN II exam reveals vision grossly intact.  No nystagmus at any point of gaze. Ears: Auricles well formed without lesions.  Ear canals are intact without mass or lesion.  No erythema or edema is appreciated.  The TMs are intact without fluid. Nose: External evaluation reveals normal support and skin without lesions.  Dorsum is intact.  Anterior rhinoscopy reveals congested mucosa over anterior aspect of inferior turbinates and intact septum.  No purulence noted. Oral:  Oral cavity and oropharynx are  intact, symmetric, without erythema or edema.  Mucosa is moist without lesions. Neck: Full range of motion without pain.  There is no significant lymphadenopathy.  No masses palpable.  Thyroid bed within normal limits to palpation.  Parotid glands and submandibular glands equal bilaterally without mass.  Trachea is midline. Neuro:  CN 2-12 grossly intact.  Vestibular: No nystagmus at any point of gaze. Dix Hallpike negative. Vestibular: There is no nystagmus with pneumatic pressure on either tympanic membrane or Valsalva. The cerebellar examination is unremarkable.    Assessment: 1.  Recurrent dizziness, secondary to central vestibular dysfunction.  No significant peripheral vestibular weakness was noted on her vestibular neurodiagnostic testing. 2.  Her ear canals, tympanic membranes, and middle ear spaces are normal. 3.  Subjectively stable bilateral high-frequency sensorineural hearing loss. 4.  The severity of her dizziness has improved over the past month.  Currently she is not symptomatic.  Plan: 1.  The physical exam findings and the vestibular neurodiagnostic testing results are extensively discussed with the patient. 2.  The pathophysiology of dizziness and central vestibular dysfunction are reviewed with the patient.  Questions are invited and answered. 3.  The option of vestibular rehabilitation to treat her central vestibular dysfunction is discussed.  Since the patient is currently asymptomatic, she would like to defer on the therapy at this time. 4.  The patient is encouraged to call my office with any questions or concerns.

## 2023-02-10 ENCOUNTER — Encounter (HOSPITAL_COMMUNITY): Payer: Medicare Other

## 2023-02-10 ENCOUNTER — Encounter (HOSPITAL_COMMUNITY): Payer: Self-pay | Admitting: *Deleted

## 2023-02-10 DIAGNOSIS — I214 Non-ST elevation (NSTEMI) myocardial infarction: Secondary | ICD-10-CM

## 2023-02-10 DIAGNOSIS — Z955 Presence of coronary angioplasty implant and graft: Secondary | ICD-10-CM

## 2023-02-10 NOTE — Progress Notes (Signed)
Sandra Wheeler attended orientation on 01/19/23. Sandra Wheeler did not return to exercise after a fall at home. Sandra Wheeler has been discharged due to non attendance.Thayer Headings RN BSN

## 2023-02-13 ENCOUNTER — Encounter (HOSPITAL_COMMUNITY): Payer: Medicare Other

## 2023-02-15 ENCOUNTER — Encounter (HOSPITAL_COMMUNITY): Payer: Medicare Other

## 2023-02-17 ENCOUNTER — Encounter (HOSPITAL_COMMUNITY): Payer: Medicare Other

## 2023-02-20 ENCOUNTER — Encounter (HOSPITAL_COMMUNITY): Payer: Medicare Other

## 2023-02-22 ENCOUNTER — Encounter (HOSPITAL_COMMUNITY): Payer: Medicare Other

## 2023-02-24 ENCOUNTER — Encounter (HOSPITAL_COMMUNITY): Payer: Medicare Other

## 2023-02-27 ENCOUNTER — Telehealth (HOSPITAL_BASED_OUTPATIENT_CLINIC_OR_DEPARTMENT_OTHER): Payer: Self-pay

## 2023-02-27 ENCOUNTER — Encounter (HOSPITAL_COMMUNITY): Payer: Medicare Other

## 2023-02-27 DIAGNOSIS — I10 Essential (primary) hypertension: Secondary | ICD-10-CM

## 2023-02-27 DIAGNOSIS — I25118 Atherosclerotic heart disease of native coronary artery with other forms of angina pectoris: Secondary | ICD-10-CM

## 2023-02-27 DIAGNOSIS — R252 Cramp and spasm: Secondary | ICD-10-CM

## 2023-02-27 DIAGNOSIS — E785 Hyperlipidemia, unspecified: Secondary | ICD-10-CM

## 2023-02-27 MED ORDER — VALSARTAN 80 MG PO TABS: 80.0000 mg | ORAL_TABLET | Freq: Every day | ORAL | 3 refills | Status: DC

## 2023-02-27 NOTE — Telephone Encounter (Signed)
Rx refill sent to pharmacy. 

## 2023-03-01 ENCOUNTER — Encounter (HOSPITAL_COMMUNITY): Payer: Medicare Other

## 2023-03-03 ENCOUNTER — Encounter (HOSPITAL_COMMUNITY): Payer: Medicare Other

## 2023-03-06 ENCOUNTER — Encounter (HOSPITAL_COMMUNITY): Payer: Medicare Other

## 2023-03-08 ENCOUNTER — Encounter (HOSPITAL_COMMUNITY): Payer: Medicare Other

## 2023-03-10 ENCOUNTER — Encounter (HOSPITAL_COMMUNITY): Payer: Medicare Other

## 2023-03-13 ENCOUNTER — Encounter (HOSPITAL_COMMUNITY): Payer: Medicare Other

## 2023-03-17 ENCOUNTER — Encounter (HOSPITAL_COMMUNITY): Payer: Medicare Other

## 2023-03-20 ENCOUNTER — Encounter (HOSPITAL_COMMUNITY): Payer: Medicare Other

## 2023-03-24 ENCOUNTER — Encounter (HOSPITAL_COMMUNITY): Payer: Medicare Other

## 2023-03-27 ENCOUNTER — Encounter (HOSPITAL_COMMUNITY): Payer: Medicare Other

## 2023-03-29 ENCOUNTER — Encounter (HOSPITAL_COMMUNITY): Payer: Medicare Other

## 2023-03-31 ENCOUNTER — Other Ambulatory Visit: Payer: Self-pay | Admitting: Internal Medicine

## 2023-03-31 ENCOUNTER — Encounter (HOSPITAL_COMMUNITY): Payer: Medicare Other

## 2023-03-31 ENCOUNTER — Other Ambulatory Visit: Payer: Self-pay | Admitting: Neurology

## 2023-04-03 ENCOUNTER — Encounter (HOSPITAL_BASED_OUTPATIENT_CLINIC_OR_DEPARTMENT_OTHER): Payer: Self-pay | Admitting: Family

## 2023-04-03 ENCOUNTER — Ambulatory Visit (INDEPENDENT_AMBULATORY_CARE_PROVIDER_SITE_OTHER): Payer: Medicare Other | Admitting: Family

## 2023-04-03 VITALS — BP 122/62 | HR 59 | Ht 62.0 in | Wt 133.2 lb

## 2023-04-03 DIAGNOSIS — R5383 Other fatigue: Secondary | ICD-10-CM | POA: Diagnosis not present

## 2023-04-03 DIAGNOSIS — I25118 Atherosclerotic heart disease of native coronary artery with other forms of angina pectoris: Secondary | ICD-10-CM | POA: Diagnosis not present

## 2023-04-03 DIAGNOSIS — I10 Essential (primary) hypertension: Secondary | ICD-10-CM | POA: Diagnosis not present

## 2023-04-03 DIAGNOSIS — E785 Hyperlipidemia, unspecified: Secondary | ICD-10-CM | POA: Diagnosis not present

## 2023-04-03 NOTE — Patient Instructions (Signed)
 Medication Instructions:  No changes *If you need a refill on your cardiac medications before your next appointment, please call your pharmacy*   Lab Work: CBC, Vit b12 and Folate, CMET, Direct LDL, Vitamin D  - dihydroxy. If you have labs (blood work) drawn today and your tests are completely normal, you will receive your results only by: MyChart Message (if you have MyChart) OR A paper copy in the mail If you have any lab test that is abnormal or we need to change your treatment, we will call you to review the results.   Testing/Procedures: No Testing   Follow-Up: At [facility], you and your health needs are our priority.  As part of our continuing mission to provide you with exceptional heart care, we have created designated Provider Care Teams.  These Care Teams include your primary Cardiologist (physician) and Advanced Practice Providers (APPs -  Physician Assistants and Nurse Practitioners) who all work together to provide you with the care you need, when you need it.  We recommend signing up for the patient portal called MyChart.  Sign up information is provided on this After Visit Summary.  MyChart is used to connect with patients for Virtual Visits (Telemedicine).  Patients are able to view lab/test results, encounter notes, upcoming appointments, etc.  Non-urgent messages can be sent to your provider as well.   To learn more about what you can do with MyChart, go to [email or web address].    Your next appointment:   6 month(s)  Provider:   Annabella Scarce, MD , or Reche Finder, NP

## 2023-04-03 NOTE — Progress Notes (Signed)
 Cardiology Office Note:  .   Date:  04/03/2023  ID:  Sandra Wheeler, DOB Jun 19, 1940, MRN [id number] PCP: Charlanne Fredia CROME, MD  [facility] Providers Cardiologist:  Annabella Scarce, MD    History of Present Illness: .   Sandra Wheeler is a 83 y.o. female  with a hx of hypertension, hyperlipidemia, bradycardia, syncope, CAD s/p NSTEMI 12/07/22 with DES-proximal ramus.   Prior coronary CTA calcium  score 78 in 2009. Calcified plaque in RCA and distal LM. No obstructive disease.    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 She was started on Donepezil  which improved word finding. At follow up 02/23/22 Namenda  added and referred for formal neuropsychological testing.   She presents today for follow-up with her husband. Resides at [facility] and enjoys participating in activities there. Notes her blood pressure at home is routinely in the 130s.  She notes an occasional headache and wonders if that is related to her medications. Reports no shortness of breath nor dyspnea on exertion. Reports no chest pain, pressure, or tightness. No edema, orthopnea, PND. Reports no palpitations.    Admitted 8/15-8/16/24 with a syncope with associated bradycardia heart rate down to the 40s-50s.  Home atenolol, Aricept , amlodipine  were discontinued.  Echo with LVEF 60 to 65%, normal aortic valve.  Outpatient Zio patch recommended revealed average heart rate 70 bpm and 2 episodes of NSVT up to 8 beats and 17 episodes of SVT up to 12 beats.    Admitted 9/18 - 12/09/2022 with NSTEMI with LHC requiring PCI of proximal ramus vessel with DES. Echo LVEF 60-65%, no RWMA, no significant valvular abnormalities.   Last seen 12/30/22 feeling fatigued but hopeful it would improve with cardiac rehab. She was started on low dose Toprol  12.5mg  daily for episodes of VT and SVT on monitor.   Presents today for follow up. She was participating in cardiac rehab but had a fall and elected to do PT at  [facility]. Has since completed and now doing exercise class three times per week with a strength, balance, and flexilivity class . Does note she feels tired after exercise. Feels her energy level and stamina have overall palteaued. Endorses feeling tired all the time. Has had trouble falling asleep previously found melatonin was ineffective. Notes since Dr. Lavoie retired is nearly out of refills of  fosamax , encouraged to discuss with Dr. Gudena.   ROS: Please see the history of present illness.    All other systems reviewed and are negative.   Studies Reviewed: .        Cardiac Studies & Procedures   CARDIAC CATHETERIZATION  CARDIAC CATHETERIZATION 12/08/2022  Narrative   Ramus-2 lesion is 95% stenosed.  A drug-eluting stent was successfully placed using a SYNERGY XD 2.25X16.   Post intervention, there is a 0% residual stenosis.   Prox Cx lesion is 25% stenosed.   Ost LAD to Prox LAD lesion is 40% stenosed.   Ramus-1 lesion is 25% stenosed.   Mid LM to Dist LM lesion is 25% stenosed.   The left ventricular systolic function is normal.   LV end diastolic pressure is mildly elevated.   The left ventricular ejection fraction is 55-65% by visual estimate.   There is no aortic valve stenosis.   In the absence of any other complications or medical issues, we expect the patient to be ready for discharge from an interventional cardiology perspective on 12/09/2022.   Recommend uninterrupted dual antiplatelet therapy with Aspirin   81mg  daily and Ticagrelor  90mg  twice daily for a minimum of 12 months (ACS-Class I recommendation).   Vasospasm noted in teh right radial.  5 Fr guide catheter used. Severe coronary vasospasm noted as well.  IC NTG given.  Successful PCI of the proximal ramus vessel with drug-eluting stent as noted above.  Due to tortuosity and moderate, diffuse disease of the distal left main and ostial LAD, delivering balloons and stents was difficult.  5 French guide catheter had  to be used due to significant vasospasm and discomfort in the right wrist.  Continue dual antiplatelet therapy for at least a year along with aggressive secondary prevention.  If she has any bleeding issues, could stop aspirin  and use Brilinta  monotherapy or clopidogrel  monotherapy.  Findings Coronary Findings Diagnostic  Dominance: Right  Left Main Mid LM to Dist LM lesion is 25% stenosed.  Left Anterior Descending Ost LAD to Prox LAD lesion is 40% stenosed.  Ramus Intermedius Ramus-1 lesion is 25% stenosed. Ramus-2 lesion is 95% stenosed. Vessel is the culprit lesion. The lesion is eccentric.  Left Circumflex Prox Cx lesion is 25% stenosed.  Right Coronary Artery The vessel exhibits minimal luminal irregularities.  Intervention  Ramus-2 lesion Stent CATH LAUNCHER 60F EBU3.5 guide catheter was inserted. Lesion crossed with guidewire using a WIRE RUNTHROUGH .[id number]. Pre-stent angioplasty was performed using a BALLOON TAKERU 1.5X12. 2.0 balloon would not cross so Takeru was used.  A drug-eluting stent was successfully placed using a SYNERGY XD 2.25X16. Stent strut is well apposed. Post-stent angioplasty was performed using a BALLN [place] EMERGE MR 2.5X8. Post-Intervention Lesion Assessment The intervention was successful. Pre-interventional TIMI flow is 3. Post-intervention TIMI flow is 3. No complications occurred at this lesion. There is a 0% residual stenosis post intervention.    ECHOCARDIOGRAM  ECHOCARDIOGRAM COMPLETE 12/09/2022  Narrative ECHOCARDIOGRAM REPORT    Patient Name:   Sandra Wheeler Date of Exam: 12/09/2022 Medical Rec #:  [id number]      Height:       62.0 in Accession #:    [id number]     Weight:       134.5 lb Date of Birth:  [birth year]      BSA:          1.615 m Patient Age:    82 years       BP:           138/52 mmHg Patient Gender: F              HR:           56 bpm. Exam Location:  Inpatient  Procedure: 2D Echo, Cardiac Doppler and Color  Doppler  Indications:    NSTEMI  History:        Patient has prior history of Echocardiogram examinations, most recent 11/04/2022. CAD; Risk Factors:Hypertension and Dyslipidemia.  Sonographer:    Tinnie Barefoot RDCS Referring Phys: [id number] ROLLO JONELLE LOUDER  IMPRESSIONS   1. Left ventricular ejection fraction, by estimation, is 60 to 65%. The left ventricle has normal function. The left ventricle has no regional wall motion abnormalities. Left ventricular diastolic function could not be evaluated. 2. Right ventricular systolic function is normal. The right ventricular size is normal. There is normal pulmonary artery systolic pressure. 3. The mitral valve is normal in structure. No evidence of mitral valve regurgitation. No evidence of mitral stenosis. 4. The aortic valve is normal in structure. Aortic valve regurgitation is trivial. No aortic stenosis is present. 5. The inferior vena  cava is normal in size with greater than 50% respiratory variability, suggesting right atrial pressure of 3 mmHg.  FINDINGS Left Ventricle: Left ventricular ejection fraction, by estimation, is 60 to 65%. The left ventricle has normal function. The left ventricle has no regional wall motion abnormalities. The left ventricular internal cavity size was normal in size. There is no left ventricular hypertrophy. Left ventricular diastolic function could not be evaluated due to indeterminate diastolic function. Left ventricular diastolic function could not be evaluated.  Right Ventricle: The right ventricular size is normal. No increase in right ventricular wall thickness. Right ventricular systolic function is normal. There is normal pulmonary artery systolic pressure. The tricuspid regurgitant velocity is 2.62 m/s, and with an assumed right atrial pressure of 3 mmHg, the estimated right ventricular systolic pressure is 30.5 mmHg.  Left Atrium: Left atrial size was normal in size.  Right Atrium: Right atrial  size was normal in size.  Pericardium: There is no evidence of pericardial effusion.  Mitral Valve: The mitral valve is normal in structure. No evidence of mitral valve regurgitation. No evidence of mitral valve stenosis.  Tricuspid Valve: The tricuspid valve is normal in structure. Tricuspid valve regurgitation is trivial. No evidence of tricuspid stenosis.  Aortic Valve: The aortic valve is normal in structure. Aortic valve regurgitation is trivial. No aortic stenosis is present.  Pulmonic Valve: The pulmonic valve was normal in structure. Pulmonic valve regurgitation is not visualized. No evidence of pulmonic stenosis.  Aorta: The aortic root is normal in size and structure.  Venous: The inferior vena cava is normal in size with greater than 50% respiratory variability, suggesting right atrial pressure of 3 mmHg.  IAS/Shunts: No atrial level shunt detected by color flow Doppler.   LEFT VENTRICLE PLAX 2D LVIDd:         3.90 cm   Diastology LVIDs:         2.10 cm   LV e' medial:    7.29 cm/s LV PW:         0.90 cm   LV E/e' medial:  13.9 LV IVS:        0.90 cm   LV e' lateral:   7.07 cm/s LVOT diam:     1.80 cm   LV E/e' lateral: 14.3 LV SV:         72 LV SV Index:   45 LVOT Area:     2.54 cm   RIGHT VENTRICLE             IVC RV Basal diam:  2.40 cm     IVC diam: 1.80 cm RV S prime:     11.40 cm/s TAPSE (M-mode): 2.2 cm  LEFT ATRIUM             Index        RIGHT ATRIUM          Index LA diam:        3.70 cm 2.29 cm/m   RA Area:     8.82 cm LA Vol (A2C):   43.2 ml 26.75 ml/m  RA Volume:   17.10 ml 10.59 ml/m LA Vol (A4C):   31.6 ml 19.57 ml/m LA Biplane Vol: 37.9 ml 23.47 ml/m AORTIC VALVE LVOT Vmax:   107.00 cm/s LVOT Vmean:  74.400 cm/s LVOT VTI:    0.284 m  AORTA Ao Root diam: 2.70 cm Ao Asc diam:  2.80 cm  MITRAL VALVE  TRICUSPID VALVE MV Area (PHT): 3.21 cm     TR Peak grad:   27.5 mmHg MV Decel Time: 236 msec     TR Vmax:        262.00  cm/s MV E velocity: 101.00 cm/s MV A velocity: 74.40 cm/s   SHUNTS MV E/A ratio:  1.36         Systemic VTI:  0.28 m Systemic Diam: 1.80 cm  Aditya Sabharwal Electronically signed by Ria Commander Signature Date/Time: 12/09/2022/3:48:58 PM    Final   MONITORS  LONG TERM MONITOR (3-14 DAYS) 11/30/2022  Narrative 14 Day Zio Monitor  Quality: Fair.  Baseline artifact. Predominant rhythm: sinus rhythm Average heart rate: 70 bpm Max heart rate: 144 bpm Min heart rate: 45 bpm Pauses >2.5 seconds:none  Rare PACs and PVCs (<1%) 2 episodes of NSVT lasting up to 8 beats 17 episodes of SVT lasting up to 12 beats   Tiffany C. Raford, MD, [facility] 12/11/2022 9:46 AM             Risk Assessment/Calculations:             Physical Exam:   VS:  BP 122/62   Pulse (!) 59   Ht 5' 2 (1.575 m)   Wt 133 lb 3.2 oz (60.4 kg)   SpO2 96%   BMI 24.36 kg/m    Wt Readings from Last 3 Encounters:  04/03/23 133 lb 3.2 oz (60.4 kg)  02/07/23 140 lb (63.5 kg)  01/30/23 130 lb 9.6 oz (59.2 kg)    GEN: Well nourished, well developed in no acute distress NECK: No JVD; No carotid bruits CARDIAC: RRR, no murmurs, rubs, gallops RESPIRATORY:  Clear to auscultation without rales, wheezing or rhonchi  ABDOMEN: Soft, non-tender, non-distended EXTREMITIES:  No edema; No deformity   ASSESSMENT AND PLAN: .    HTN - BP well controlled. Continue current antihypertensive regimen.    HLD -12/09/2022 LP(a) 39.2.  05/2022 LDL 75.  LDL goal <70. Continue Pravastatin  40mg  QD, Zetia  10mg  QD. Update direct LDL, if not at goal could transition to Rosuvastatin .  Bradycardia / VT / SVT - Amlodipine , Aricept , Atenolol discontinued during 08/2022 hypotension. However subsequent monitor with brief episodes of VT and SVT. Continue low dose Toprol  12.5mg  daily.  CAD s/p NSTEMI 12/07/22 with DES-proximal ramus - Recommended for DAPT x 12 months (unless bleeding issues in which case brilinta  or plavix  monotherapy).  Stable with no anginal symptoms. No indication for ischemic evaluation.  GDMT pravastatin , metoprolol , zetia , aspirin , plavix .   Fatigue - Due to ongoing fatigue and activity intolerance despite completing PT plan for lab assessment: B12, vitamin D , CBC             Dispo: follow up in 3 months  Signed, Reche GORMAN Finder, NP

## 2023-04-05 ENCOUNTER — Encounter (HOSPITAL_COMMUNITY): Payer: Medicare Other

## 2023-04-05 NOTE — Progress Notes (Deleted)
 Patient: Sandra Wheeler Date of Birth: [birth year]  Reason for Visit: Follow up History from: Patient, husband Primary Neurologist: [facility]  ASSESSMENT AND PLAN 83 y.o. 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  Unclear if Namenda is the culprit, but nonetheless has noted zero benefit in her memory with Namenda -Continue Aricept 10 mg daily -Recommend against driving given MOCA score, specifically difficulty with visual-spatial function  -I will again place referral for formal neuropsychological testing  to help with categorizing memory deficits with underlying anxiety , help her and her husband plan for the future -MRI of the brain showed mild generalized cortical atrophy, a little more than normal for age -Encouraged exercise, healthy eating, brain stimulating activities, good management of vascular risk factors -Follow-up in 6 months or sooner if needed  HISTORY OF PRESENT ILLNESS: Today 04/05/23   09/20/22 SS: MOCA 18/30. Remains on [facility] Independent Living. More trouble spelling words. Remains on Aricept 10 mg daily. We started Namenda 10 mg twice daily in Dec 2023. Has had some dizziness, wonders if related? If gets up fast noted. When walking, has to tell legs to move. Fell 1 month ago, her foot got hung on curb. 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  I reviewed PCP note Dr. Chales [facility] 09/14/22, not orthostatic,, therapy felt dizziness was related to vision.  Husband has noted decline in memory.  She remains independent in ADLs.  Update 02/23/22 SS: Ms. Sandra Wheeler is here today for follow-up.  MRI of the brain September 2023 showed mild generalized cortical atrophy, a little more than normal for age.  She was started on Aricept 10 mg. MOCA was 14/30. Today, she feels things are significantly better since starting Aricept, has helped a  lot with word findings, MOCA 18/30. She is taking Aricept 10 mg in AM. Doesn't think any side effects. She drives without any issues, not getting lost. [facility]. She does mention stress, not for any particular reason, is on Sandra Wheeler, takes [facility] at night if needed. 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   HISTORY  11/17/21 Dr. Delena [facility]: The patient presents for evaluation of memory loss over the past year. It has been getting worse over time. She has noticed issues primarily with short term memory and word finding difficulty. Forgets how to work things around the house, for example with not remember how to turn the television on. It also takes her longer to do things on the computer. Has trouble figuring out what time it is, or what time she needs to be places. Has gone to places an hour early twice this week. Forgets acquaintances and friends names, has not forgotten family members names. She remains socially active and is the head of a committee, which is somewhat stressful for her.    She is afraid of falling. 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 [name] and sprained her ankle 2 years ago. Also fell about 3 years ago and hit her head. She does yoga 2-3 times per week.    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 Hit her head on the door and thinks she may have a had a concussion. Lost her sense of smell and it  never returned. Did not seek evaluation. 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 She talks in her sleep and snores a little. Typically does not act out her dreams. Mood: History of anxiety, takes [facility] but is not sure if it helps   Functional status:  Patient lives with her husband Cooking: no issues, does not cook much Driving: drives a little bit, has not  gotten lost but will sometimes forget where to turn next Bills: does not manage the finances, but does not think she would be able to Medications: no issues, she would like to simplify her medications Ever left the stove on by accident?: no Forget how to use items around the house?: yes, television Forgetting loved ones names?: no Word finding difficulty? yes   OTHER MEDICAL CONDITIONS: CAD, HTN, HLD, anxiety  REVIEW OF SYSTEMS: Out of a complete 14 system review of symptoms, the patient complains only of the following symptoms, and all other reviewed systems are negative.  See HPI  ALLERGIES: Allergies  Allergen Reactions   Codeine Other (See Comments)    Knocked her out Other reaction(s): Unknown   Meloxicam Other (See Comments)    BLURRY VISION, DIZZY, CONFUSION  Other reaction(s): Unknown   Sulfamethoxazole-Trimethoprim     Unknown reaction   Tramadol Other (See Comments)    CONFUSION     HOME MEDICATIONS: Outpatient Medications Prior to Visit  Medication Sig Dispense Refill   alendronate (FOSAMAX) 70 MG tablet TAKE 1 TABLET BY MOUTH EVERY 7 DAYS WITH A FULL GLASS OF WATER ON AN EMPTY STOMACH 12 tablet 0   aspirin 81 MG chewable tablet Chew 1 tablet (81 mg total) by mouth daily. 90 tablet 3   calcium carbonate (CALCIUM 600) 600 MG TABS tablet Take 600 mg by mouth daily with breakfast.     clopidogrel (PLAVIX) 75 MG tablet Take 1 tablet (75 mg total) by mouth daily. 90 tablet 3   donepezil (ARICEPT) 10 MG tablet TAKE ONE TABLET BY MOUTH ONE TIME DAILY 90 tablet 0   ezetimibe (ZETIA) 10 MG tablet Take 1 tablet (10 mg total) by mouth daily. 90 tablet 3   Latanoprost PF 0.005 % SOLN Place 1 drop into both eyes at bedtime.     metoprolol succinate (TOPROL-XL) 25 MG 24 hr tablet Take 0.5 tablets (12.5 mg total) by mouth daily. Take with or immediately following a meal. 30 tablet 11   Multiple Vitamin (MULTIVITAMIN WITH MINERALS) TABS tablet Take 1 tablet by mouth daily.      pravastatin (PRAVACHOL) 40 MG tablet Take 1 tablet (40 mg total) by mouth every evening. 90 tablet 3   valsartan (DIOVAN) 80 MG tablet Take 1 tablet (80 mg total) by mouth daily. Please schedule appointment with Dr. Duke Salvia for refills. 90 tablet 3   No facility-administered medications prior to visit.    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/25/2019   Cataract    unsure of which eye   Colon polyps    Environmental allergies    Essential hypertension 06/25/2019   Genetic testing 04/28/2017   Multi-Cancer panel (83 genes) @ [facility] - No pathogenic mutations detected   GERD (gastroesophageal reflux disease)    Glaucoma    High  cholesterol    Hypertension    IBS (irritable bowel syndrome)    Memory change    Osteoporosis 06/2016   T score -2.8   PONV (postoperative nausea and vomiting)    Pure hypercholesterolemia 06/25/2019   Seizures (HCC)    childhood, 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    PAST SURGICAL HISTORY: Past Surgical History:  Procedure Laterality Date   ABDOMINAL HYSTERECTOMY     BLADDER SUSPENSION N/A 04/08/2016   Procedure: TRANSVAGINAL TAPE (TVT) PROCEDURE;  Surgeon: Genia Del, MD;  Location: [facility];  Service: Gynecology;  Laterality: N/A;   BREAST SURGERY     cyst removal   CARDIAC CATHETERIZATION     CATARACT EXTRACTION, BILATERAL     COLONOSCOPY     CORONARY STENT INTERVENTION N/A 12/08/2022   Procedure: CORONARY STENT INTERVENTION;  Surgeon: Corky Crafts, MD;  Location: [facility];  Service: Cardiovascular;  Laterality: N/A;   CYSTOSCOPY N/A 04/08/2016   Procedure: CYSTOSCOPY;  Surgeon: Genia Del, MD;  Location: [facility];  Service: Gynecology;  Laterality: N/A;   DENTAL SURGERY  03/31/2016   ELBOW FRACTURE SURGERY     EYE SURGERY Left    blood clot on retina   LEFT HEART CATH AND CORONARY  ANGIOGRAPHY N/A 12/08/2022   Procedure: LEFT HEART CATH AND CORONARY ANGIOGRAPHY;  Surgeon: Corky Crafts, MD;  Location: [facility];  Service: Cardiovascular;  Laterality: N/A;   ROBOTIC ASSISTED TOTAL HYSTERECTOMY WITH BILATERAL SALPINGO OOPHERECTOMY Bilateral 04/08/2016   Procedure: ROBOTIC ASSISTED TOTAL HYSTERECTOMY WITH BILATERAL SALPINGO OOPHORECTOMY/Uterosacral Ligament Suspension;  Surgeon: Genia Del, MD;  Location: [facility];  Service: Gynecology;  Laterality: Bilateral;  Request 4 hrs.   TUBAL LIGATION     WRIST ARTHROSCOPY      FAMILY HISTORY: Family History  Problem Relation Age of Onset   Macular degeneration Mother    Hearing loss Mother    CAD Mother    Diabetes Father        dx in his 54s-60s   Heart disease Father    Rheumatic fever Father    Pancreatic cancer Brother 31       non-smoker; deceased 57   Cancer Maternal Grandmother        abdominal; unsure of primary; deceased 66   Heart disease Maternal Grandfather    Congestive Heart Failure Paternal Grandfather    Other Brother        at birth   Cancer Other        MGMs sister, abdominal; unsure of primary; deceased 86s   Pancreatic cancer Maternal Uncle 44       non-smoker; deceased 85   Alcohol abuse Maternal Uncle    Dementia Paternal Aunt    Congestive Heart Failure Paternal Uncle    Leukemia Cousin        female; daughter of mat uncle with pancreatic ca; deceased 79    SOCIAL HISTORY: Social History   Socioeconomic History   Marital status: Married    Spouse name: richard   Number of children: 1   Years of education: 16   Highest education level: Bachelor's degree (e.g., BA, AB, BS)  Occupational History   Occupation: Retired  Tobacco Use   Smoking status: Former    Current packs/day: 0.00    Types: Cigarettes    Quit date: 03/21/1969    Years since quitting: 54.0   Smokeless tobacco: Never  Vaping Use   Vaping status: Never Used  Substance and Sexual  Activity   Alcohol  use: Yes    Alcohol/week: 1.0 standard drink of alcohol    Types: 1 Glasses of wine per week    Comment: 7 a week   Drug use: No   Sexual activity: Not Currently    Birth control/protection: Surgical    Comment: First intercouse at age 59. Only one partner.  Other Topics Concern   Not on file  Social History Narrative   11/17/21 lives with husband   Coffee 1-2 daily   Social Drivers of Health   Financial Resource Strain: Not on file  Food Insecurity: No Food Insecurity (12/09/2022)   Hunger Vital Sign    Worried About Running Out of Food in the Last Year: Never true    Ran Out of Food in the Last Year: Never true  Transportation Needs: No Transportation Needs (12/09/2022)   PRAPARE - Administrator, Civil Service (Medical): No    Lack of Transportation (Non-Medical): No  Physical Activity: Not on file  Stress: Not on file  Social Connections: Not on file  Intimate Partner Violence: Not At Risk (12/09/2022)   Humiliation, Afraid, Rape, and Kick questionnaire    Fear of Current or Ex-Partner: No    Emotionally Abused: No    Physically Abused: No    Sexually Abused: No   PHYSICAL EXAM  There were no vitals filed for this visit.   There is no height or weight on file to calculate BMI.    09/20/2022    3:18 PM 02/23/2022    3:01 PM 11/17/2021    9:37 AM  Montreal Cognitive Assessment   Visuospatial/ Executive (0/5) 3 0 1  Naming (0/3) 2 3 2   Attention: Read list of digits (0/2) 1 2 2   Attention: Read list of letters (0/1) 1 1 0  Attention: Serial 7 subtraction starting at 100 (0/3) 1 1 0  Language: Repeat phrase (0/2) 1 2 0  Language : Fluency (0/1) 1 0 0  Abstraction (0/2) 2 2 2   Delayed Recall (0/5) 0 1 2  Orientation (0/6) 6 6 5   Total 18 18 14   Adjusted Score (based on education) 18 18     Generalized: Well developed, in no acute distress  Neurological examination  Mentation: Alert oriented to time, place, history taking. Follows all commands speech  and language fluent Cranial nerve II-XII: Pupils were equal round reactive to light. Extraocular movements were full, visual field were full on confrontational test. Facial sensation and strength were normal.  Head turning and shoulder shrug  were normal and symmetric. Motor: The motor testing reveals 5 over 5 strength of all 4 extremities. Good symmetric motor tone is noted throughout.  Sensory: Sensory testing is intact to soft touch on all 4 extremities. No evidence of extinction is noted.  Coordination: Cerebellar testing reveals good finger-nose-finger and heel-to-shin bilaterally.  Gait and station: Gait is normal.  Reflexes: Deep tendon reflexes are symmetric and normal bilaterally.   DIAGNOSTIC DATA (LABS, IMAGING, TESTING) - I reviewed patient records, labs, notes, testing and imaging myself where available.  Lab Results  Component Value Date   WBC 5.7 12/09/2022   HGB 11.3 (L) 12/09/2022   HCT 33.6 (L) 12/09/2022   MCV 95.7 12/09/2022   PLT 246 12/09/2022      Component Value Date/Time   NA 138 12/09/2022 0830   NA 141 04/12/2022 1043   K 3.6 12/09/2022 0830   CL 109 12/09/2022 0830   CO2 19 (L) 12/09/2022  0830   GLUCOSE 99 12/09/2022 0830   BUN 12 12/09/2022 0830   BUN 16 04/12/2022 1043   CREATININE 0.78 12/09/2022 0830   CREATININE 0.70 06/16/2022 0800   CALCIUM 8.3 (L) 12/09/2022 0830   PROT 6.1 (L) 12/07/2022 2152   PROT 6.9 04/12/2022 1043   ALBUMIN 3.2 (L) 12/07/2022 2152   ALBUMIN 4.5 04/12/2022 1043   AST 27 12/07/2022 2152   ALT 16 12/07/2022 2152   ALKPHOS 41 12/07/2022 2152   BILITOT 0.5 12/07/2022 2152   BILITOT 0.3 04/12/2022 1043   GFRNONAA >60 12/09/2022 0830   GFRAA 83 08/08/2019 0817   Lab Results  Component Value Date   CHOL 150 06/16/2022   HDL 52 06/16/2022   LDLCALC 75 06/16/2022   LDLDIRECT 145 (H) 04/12/2022   TRIG 157 (H) 06/16/2022   CHOLHDL 2.9 06/16/2022   No results found for: "HGBA1C" No results found for: "VITAMINB12" Lab  Results  Component Value Date   TSH 3.349 11/03/2022    Margie Ege, AGNP-C, DNP 04/05/2023, 2:24 PM [facility] [address], [address] [place], [place] [address] [phone]

## 2023-04-06 ENCOUNTER — Ambulatory Visit: Payer: Medicare Other | Admitting: Neurology

## 2023-04-07 ENCOUNTER — Encounter (HOSPITAL_COMMUNITY): Payer: Medicare Other

## 2023-04-10 ENCOUNTER — Encounter (HOSPITAL_COMMUNITY): Payer: Medicare Other

## 2023-04-10 ENCOUNTER — Telehealth: Payer: Self-pay | Admitting: Neurology

## 2023-04-10 NOTE — Telephone Encounter (Signed)
Pt's husband, Karren Bartus called to verify appointment.

## 2023-04-11 ENCOUNTER — Encounter: Payer: Self-pay | Admitting: Neurology

## 2023-04-11 ENCOUNTER — Ambulatory Visit (INDEPENDENT_AMBULATORY_CARE_PROVIDER_SITE_OTHER): Payer: Medicare Other | Admitting: Neurology

## 2023-04-11 VITALS — BP 124/78 | HR 80 | Ht 61.0 in | Wt 134.0 lb

## 2023-04-11 DIAGNOSIS — R42 Dizziness and giddiness: Secondary | ICD-10-CM

## 2023-04-11 DIAGNOSIS — F03B0 Unspecified dementia, moderate, without behavioral disturbance, psychotic disturbance, mood disturbance, and anxiety: Secondary | ICD-10-CM | POA: Diagnosis not present

## 2023-04-11 DIAGNOSIS — R55 Syncope and collapse: Secondary | ICD-10-CM

## 2023-04-11 MED ORDER — DONEPEZIL HCL 10 MG PO TABS: 10.0000 mg | ORAL_TABLET | Freq: Every day | ORAL | 1 refills | Status: DC

## 2023-04-11 MED ORDER — MEMANTINE HCL 5 MG PO TABS: 5.0000 mg | ORAL_TABLET | Freq: Two times a day (BID) | ORAL | 3 refills | Status: DC

## 2023-04-11 NOTE — Patient Instructions (Addendum)
Restart Namenda 5 mg twice daily for memory, monitor for dizziness or other side effect. If tolerates well and wish to increase let me know.   Check ATN profile, screening for Alzheimer's markers   Continue Aricept 10 mg daily   Follow up in 6 months   Memantine Tablets What is this medication? MEMANTINE (MEM an teen) treats memory loss and confusion (dementia) in people who have Alzheimer disease. It works by improving attention, memory, and the ability to engage in daily activities. It is not a cure for dementia or Alzheimer disease. This medicine may be used for other purposes; ask your health care provider or pharmacist if you have questions. COMMON BRAND NAME(S): Namenda What should I tell my care team before I take this medication? They need to know if you have any of these conditions: Kidney disease Liver disease Seizures Trouble passing urine An unusual or allergic reaction to memantine, other medications, foods, dyes, or preservatives Pregnant or trying to get pregnant Breast-feeding How should I use this medication? Take this medication by mouth with water. Follow the directions on the prescription label. You may take this medication with or without food. Take your doses at regular intervals. Do not take your medication more often than directed. Continue to take your medication even if you feel better. Do not stop taking except on the advice of your care team. Talk to your care team about the use of this medication in children. Special care may be needed Overdosage: If you think you have taken too much of this medicine contact a poison control center or emergency room at once. NOTE: This medicine is only for you. Do not share this medicine with others. What if I miss a dose? If you miss a dose, take it as soon as you can. If it is almost time for your next dose, take only that dose. Do not take double or extra doses. If you do not take your medication for several days, contact  your care team. Your dose may need to be changed. What may interact with this medication? Acetazolamide Amantadine Cimetidine Dextromethorphan Dofetilide Hydrochlorothiazide Ketamine Metformin Methazolamide Quinidine Ranitidine Sodium bicarbonate Triamterene This list may not describe all possible interactions. Give your health care provider a list of all the medicines, herbs, non-prescription drugs, or dietary supplements you use. Also tell them if you smoke, drink alcohol, or use illegal drugs. Some items may interact with your medicine. What should I watch for while using this medication? Visit your care team for regular checks on your progress. Check with your care team if there is no improvement in your symptoms or if they get worse. This medication may affect your coordination, reaction time, or judgment. Do not drive or operate machinery until you know how this medication affects you. Sit up or stand slowly to reduce the risk of dizzy or fainting spells. Drinking alcohol with this medication can increase the risk of these side effects. What side effects may I notice from receiving this medication? Side effects that you should report to your care team as soon as possible: Allergic reactions--skin rash, itching, hives, swelling of the face, lips, tongue, or throat Side effects that usually do not require medical attention (report to your care team if they continue or are bothersome): Confusion Constipation Diarrhea Dizziness Headache This list may not describe all possible side effects. Call your doctor for medical advice about side effects. You may report side effects to FDA at 1-800-FDA-1088. Where should I keep my medication? Keep  out of the reach of children. Store at room temperature between 15 degrees and 30 degrees C (59 degrees and 86 degrees F). Throw away any unused medication after the expiration date. NOTE: This sheet is a summary. It may not cover all possible  information. If you have questions about this medicine, talk to your doctor, pharmacist, or health care provider.  2024 Elsevier/Gold Standard (2021-03-30 00:00:00)

## 2023-04-11 NOTE — Progress Notes (Signed)
Patient: JD WALTZ Date of Birth: 10/15/1940  Reason for Visit: Follow up History from: Patient, husband Primary Neurologist: Chima/Yan  ASSESSMENT AND PLAN 83 y.o. 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  In the setting of 2 hospitalizations for syncope, NSTEMI, bradycardia.  Aricept was stopped.  Has been felt drastic decline at this time.  At some point Aricept was restarted, but memory has not rebounded.  -Screen with ATN profile for Alzheimer's markers -Restart Namenda low-dose 5 mg twice daily, if tolerates and benefit we can increase to 10 mg twice daily.  We originally stopped in July to see if any contribution to dizziness -Can stay on Aricept for now, HR was 80 today -Planning to see Dr. Kieth Brightly in February, see what ATN profile shows, unclear how helpful evaluation will be at this point -Encouraged exercise, healthy eating, brain stimulating activities, good management of vascular risk factors -MRI of the brain in September 2024 showed age-related cerebral atrophy, slightly more advanced and posterior left frontal lobe, scattered mild chronic small vessel ischemic disease. -Follow-up in 6 months or sooner if needed  HISTORY OF PRESENT ILLNESS: Today 04/11/23 Admitted September 2024 for bradycardia and syncope.  Atenolol, Aricept, amlodipine were discontinued.  Admitted for NSTEMI had cardiac cath with stent placement. Family sent my chart message reporting worsening cognitive function when stopping Aricept. Today MOCA 11/30. Remains at independent living. Memory was declined since 1st hospitalization in August 2024, needs more direction, supervision. We stopped her Namenda in July, concern for dizziness. 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 Seeing Dr. Kieth Brightly in Feb 2025. Needs some assistance with dressing matching clothes. Does her own showering,  toilet ing. Her left eye is blind, hold onto her husband for balance.  Is no longer driving.   09/20/22 SS: MOCA 18/30. Remains on [facility] Independent Living. More trouble spelling words. Remains on Aricept 10 mg daily. We started Namenda 10 mg twice daily in Dec 2023. Has had some dizziness, wonders if related? If gets up fast noted. When walking, has to tell legs to move. Fell 1 month ago, her foot got hung on curb. 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  I reviewed PCP note Dr. Chales Abrahams 09/14/22, not orthostatic,, therapy felt dizziness was related to vision.  Husband has noted decline in memory.  She remains independent in ADLs.  Update 02/23/22 SS: Ms. Kindig is here today for follow-up.  MRI of the brain September 2023 showed mild generalized cortical atrophy, a little more than normal for age.  She was started on Aricept 10 mg. MOCA was 14/30. Today, she feels things are significantly better since starting Aricept, has helped a lot with word findings, MOCA 18/30. She is taking Aricept 10 mg in AM. Doesn't think any side effects. She drives without any issues, not getting lost. They lives at [facility] independent living. She does mention stress, not for any particular reason, is on Lexapro, takes Xanax at night if needed. 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   HISTORY  11/17/21 Dr. Delena Bali: The patient presents for evaluation of memory loss over the past year. It has been getting worse over time. She has noticed issues primarily with short term memory and word finding difficulty. Forgets how to work things around the house, for example with not remember how to turn the  television on. It also takes her longer to do things on the computer. Has trouble figuring out what time it is, or what time she needs to be places. Has gone to places an hour early twice this week. Forgets acquaintances and friends names, has not forgotten family members names.  She remains socially active and is the head of a committee, which is somewhat stressful for her.    She is afraid of falling. 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 [name] and sprained her ankle 2 years ago. Also fell about 3 years ago and hit her head. She does yoga 2-3 times per week.    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 Hit her head on the door and thinks she may have a had a concussion. Lost her sense of smell and it never returned. Did not seek evaluation. 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 She talks in her sleep and snores a little. Typically does not act out her dreams. Mood: History of anxiety, takes Lexapro but is not sure if it helps   Functional status:  Patient lives with her husband Cooking: no issues, does not cook much Driving: drives a little bit, has not gotten lost but will sometimes forget where to turn next Bills: does not manage the finances, but does not think she would be able to Medications: no issues, she would like to simplify her medications Ever left the stove on by accident?: no Forget how to use items around the house?: yes, television Forgetting loved ones names?: no Word finding difficulty? yes   OTHER MEDICAL CONDITIONS: CAD, HTN, HLD, anxiety  REVIEW OF SYSTEMS: Out of a complete 14 system review of symptoms, the patient complains only of the following symptoms, and all other reviewed systems are negative.  See HPI  ALLERGIES: Allergies  Allergen Reactions   Codeine Other (See Comments)    Knocked her out Other reaction(s): Unknown   Meloxicam Other (See Comments)    BLURRY VISION, DIZZY, CONFUSION  Other reaction(s): Unknown   Sulfamethoxazole-Trimethoprim     Unknown reaction   Tramadol Other (See Comments)    CONFUSION     HOME MEDICATIONS: Outpatient Medications Prior  to Visit  Medication Sig Dispense Refill   alendronate (FOSAMAX) 70 MG tablet TAKE 1 TABLET BY MOUTH EVERY 7 DAYS WITH A FULL GLASS OF WATER ON AN EMPTY STOMACH 12 tablet 0   aspirin 81 MG chewable tablet Chew 1 tablet (81 mg total) by mouth daily. 90 tablet 3   calcium carbonate (CALCIUM 600) 600 MG TABS tablet Take 600 mg by mouth daily with breakfast.     clopidogrel (PLAVIX) 75 MG tablet Take 1 tablet (75 mg total) by mouth daily. 90 tablet 3   donepezil (ARICEPT) 10 MG tablet TAKE ONE TABLET BY MOUTH ONE TIME DAILY 90 tablet 0   ezetimibe (ZETIA) 10 MG tablet Take 1 tablet (10 mg total) by mouth daily. 90 tablet 3   Latanoprost PF 0.005 % SOLN Place 1 drop into both eyes at bedtime.     Multiple Vitamin (MULTIVITAMIN WITH MINERALS) TABS tablet Take 1 tablet by mouth daily.     pravastatin (PRAVACHOL) 40 MG tablet Take 1 tablet (40 mg total) by mouth every evening. 90 tablet 3   VALSARTAN PO  Take 20 mg by mouth daily.     metoprolol succinate (TOPROL-XL) 25 MG 24 hr tablet Take 0.5 tablets (12.5 mg total) by mouth daily. Take with or immediately following a meal. 30 tablet 11   valsartan (DIOVAN) 80 MG tablet Take 1 tablet (80 mg total) by mouth daily. Please schedule appointment with Dr. Duke Salvia for refills. 90 tablet 3   No facility-administered medications prior to visit.    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/25/2019   Cataract    unsure of which eye   Colon polyps    Environmental allergies    Essential hypertension 06/25/2019   Genetic testing 04/28/2017   Multi-Cancer panel (83 genes) @ [facility] - No pathogenic mutations detected   GERD (gastroesophageal reflux disease)    Glaucoma    High cholesterol    Hypertension    IBS (irritable bowel syndrome)    Memory change    Osteoporosis 06/2016   T score -2.8   PONV (postoperative nausea and  vomiting)    Pure hypercholesterolemia 06/25/2019   Seizures (HCC)    childhood, 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    PAST SURGICAL HISTORY: Past Surgical History:  Procedure Laterality Date   ABDOMINAL HYSTERECTOMY     BLADDER SUSPENSION N/A 04/08/2016   Procedure: TRANSVAGINAL TAPE (TVT) PROCEDURE;  Surgeon: Genia Del, MD;  Location: [facility];  Service: Gynecology;  Laterality: N/A;   BREAST SURGERY     cyst removal   CARDIAC CATHETERIZATION     CATARACT EXTRACTION, BILATERAL     COLONOSCOPY     CORONARY STENT INTERVENTION N/A 12/08/2022   Procedure: CORONARY STENT INTERVENTION;  Surgeon: Corky Crafts, MD;  Location: [facility];  Service: Cardiovascular;  Laterality: N/A;   CYSTOSCOPY N/A 04/08/2016   Procedure: CYSTOSCOPY;  Surgeon: Genia Del, MD;  Location: [facility];  Service: Gynecology;  Laterality: N/A;   DENTAL SURGERY  03/31/2016   ELBOW FRACTURE SURGERY     EYE SURGERY Left    blood clot on retina   LEFT HEART CATH AND CORONARY ANGIOGRAPHY N/A 12/08/2022   Procedure: LEFT HEART CATH AND CORONARY ANGIOGRAPHY;  Surgeon: Corky Crafts, MD;  Location: [facility]IVE CV [facility];  Service: Cardiovascular;  Laterality: N/A;   ROBOTIC ASSISTED TOTAL HYSTERECTOMY WITH BILATERAL SALPINGO OOPHERECTOMY Bilateral 04/08/2016   Procedure: ROBOTIC ASSISTED TOTAL HYSTERECTOMY WITH BILATERAL SALPINGO OOPHORECTOMY/Uterosacral Ligament Suspension;  Surgeon: Genia Del, MD;  Location: [facility];  Service: Gynecology;  Laterality: Bilateral;  Request 4 hrs.   TUBAL LIGATION     WRIST ARTHROSCOPY      FAMILY HISTORY: Family History  Problem Relation Age of Onset   Macular degeneration Mother    Hearing loss Mother    CAD Mother    Diabetes Father        dx in his 36s-60s   Heart disease Father    Rheumatic fever Father    Pancreatic cancer Brother 49       non-smoker; deceased 95   Cancer Maternal Grandmother        abdominal; unsure of primary; deceased  48   Heart disease Maternal Grandfather    Congestive Heart Failure Paternal Grandfather    Other Brother        at birth   Cancer Other        MGMs sister,  abdominal; unsure of primary; deceased 24s   Pancreatic cancer Maternal Uncle 76       non-smoker; deceased 21   Alcohol abuse Maternal Uncle    Dementia Paternal Aunt    Congestive Heart Failure Paternal Uncle    Leukemia Cousin        female; daughter of mat uncle with pancreatic ca; deceased 68    SOCIAL HISTORY: Social History   Socioeconomic History   Marital status: Married    Spouse name: richard   Number of children: 1   Years of education: 16   Highest education level: Bachelor's degree (e.g., BA, AB, BS)  Occupational History   Occupation: Retired  Tobacco Use   Smoking status: Former    Current packs/day: 0.00    Types: Cigarettes    Quit date: 03/21/1969    Years since quitting: 54.0   Smokeless tobacco: Never  Vaping Use   Vaping status: Never Used  Substance and Sexual Activity   Alcohol use: Yes    Alcohol/week: 1.0 standard drink of alcohol    Types: 1 Glasses of wine per week    Comment: 7 a week   Drug use: No   Sexual activity: Not Currently    Birth control/protection: Surgical    Comment: First intercouse at age 48. Only one partner.  Other Topics Concern   Not on file  Social History Narrative   11/17/21 lives with husband   Coffee 1-2 daily   Social Drivers of Health   Financial Resource Strain: Not on file  Food Insecurity: No Food Insecurity (12/09/2022)   Hunger Vital Sign    Worried About Running Out of Food in the Last Year: Never true    Ran Out of Food in the Last Year: Never true  Transportation Needs: No Transportation Needs (12/09/2022)   PRAPARE - Administrator, Civil Service (Medical): No    Lack of Transportation (Non-Medical): No  Physical Activity: Not on file  Stress: Not on file  Social Connections: Not on file  Intimate Partner Violence: Not At Risk  (12/09/2022)   Humiliation, Afraid, Rape, and Kick questionnaire    Fear of Current or Ex-Partner: No    Emotionally Abused: No    Physically Abused: No    Sexually Abused: No   PHYSICAL EXAM  Vitals:   04/11/23 1314  BP: 124/78  Pulse: 80  Weight: 134 lb (60.8 kg)  Height: 5\' 1"  (1.549 m)     Body mass index is 25.32 kg/m.    04/11/2023    1:31 PM 09/20/2022    3:18 PM 02/23/2022    3:01 PM 11/17/2021    9:37 AM  Montreal Cognitive Assessment   Visuospatial/ Executive (0/5) 0 3 0 1  Naming (0/3) 2 2 3 2   Attention: Read list of digits (0/2) 1 1 2 2   Attention: Read list of letters (0/1) 1 1 1  0  Attention: Serial 7 subtraction starting at 100 (0/3) 0 1 1 0  Language: Repeat phrase (0/2) 1 1 2  0  Language : Fluency (0/1) 1 1 0 0  Abstraction (0/2) 1 2 2 2   Delayed Recall (0/5) 1 0 1 2  Orientation (0/6) 3 6 6 5   Total 11 18 18 14   Adjusted Score (based on education)  18 18     Generalized: Well developed, in no acute distress  Neurological examination  Mentation: Alert oriented to time, place, history taking. Follows all commands speech fluent, but husband provides  more info today Cranial nerve II-XII: Pupils were equal round reactive to light. Extraocular movements were full, visual field were full on confrontational test. Facial sensation and strength were normal.  Head turning and shoulder shrug  were normal and symmetric. Motor: The motor testing reveals 5 over 5 strength of all 4 extremities. Good symmetric motor tone is noted throughout.  Sensory: Sensory testing is intact to soft touch on all 4 extremities. No evidence of extinction is noted.  Coordination: Cerebellar testing reveals good finger-nose-finger and heel-to-shin bilaterally.  Gait and station: Gait is little wide-based, cautious. Reflexes: Deep tendon reflexes are symmetric and normal bilaterally.   DIAGNOSTIC DATA (LABS, IMAGING, TESTING) - I reviewed patient records, labs, notes, testing and imaging  myself where available.  Lab Results  Component Value Date   WBC 5.7 12/09/2022   HGB 11.3 (L) 12/09/2022   HCT 33.6 (L) 12/09/2022   MCV 95.7 12/09/2022   PLT 246 12/09/2022      Component Value Date/Time   NA 138 12/09/2022 0830   NA 141 04/12/2022 1043   K 3.6 12/09/2022 0830   CL 109 12/09/2022 0830   CO2 19 (L) 12/09/2022 0830   GLUCOSE 99 12/09/2022 0830   BUN 12 12/09/2022 0830   BUN 16 04/12/2022 1043   CREATININE 0.78 12/09/2022 0830   CREATININE 0.70 06/16/2022 0800   CALCIUM 8.3 (L) 12/09/2022 0830   PROT 6.1 (L) 12/07/2022 2152   PROT 6.9 04/12/2022 1043   ALBUMIN 3.2 (L) 12/07/2022 2152   ALBUMIN 4.5 04/12/2022 1043   AST 27 12/07/2022 2152   ALT 16 12/07/2022 2152   ALKPHOS 41 12/07/2022 2152   BILITOT 0.5 12/07/2022 2152   BILITOT 0.3 04/12/2022 1043   GFRNONAA >60 12/09/2022 0830   GFRAA 83 08/08/2019 0817   Lab Results  Component Value Date   CHOL 150 06/16/2022   HDL 52 06/16/2022   LDLCALC 75 06/16/2022   LDLDIRECT 145 (H) 04/12/2022   TRIG 157 (H) 06/16/2022   CHOLHDL 2.9 06/16/2022   No results found for: "HGBA1C" No results found for: "VITAMINB12" Lab Results  Component Value Date   TSH 3.349 11/03/2022    Margie Ege, AGNP-C, DNP 04/11/2023, 1:34 PM [facility] [address] [place], [place] [address] [phone]

## 2023-04-12 ENCOUNTER — Encounter (HOSPITAL_COMMUNITY): Payer: Medicare Other

## 2023-04-14 ENCOUNTER — Telehealth: Payer: Self-pay | Admitting: Cardiovascular Disease

## 2023-04-14 ENCOUNTER — Encounter (HOSPITAL_BASED_OUTPATIENT_CLINIC_OR_DEPARTMENT_OTHER): Payer: Self-pay

## 2023-04-14 ENCOUNTER — Encounter: Payer: Self-pay | Admitting: Neurology

## 2023-04-14 DIAGNOSIS — R7989 Other specified abnormal findings of blood chemistry: Secondary | ICD-10-CM

## 2023-04-14 DIAGNOSIS — E785 Hyperlipidemia, unspecified: Secondary | ICD-10-CM

## 2023-04-14 LAB — COMPREHENSIVE METABOLIC PANEL
ALT: 17 [IU]/L (ref 0–32)
AST: 20 [IU]/L (ref 0–40)
Albumin: 4.4 g/dL (ref 3.7–4.7)
Alkaline Phosphatase: 58 [IU]/L (ref 44–121)
BUN/Creatinine Ratio: 27 (ref 12–28)
BUN: 23 mg/dL (ref 8–27)
Bilirubin Total: <0.2 mg/dL (ref 0.0–1.2)
CO2: 25 mmol/L (ref 20–29)
Calcium: 9.2 mg/dL (ref 8.7–10.3)
Chloride: 103 mmol/L (ref 96–106)
Creatinine, Ser: 0.86 mg/dL (ref 0.57–1.00)
Globulin, Total: 2.5 g/dL (ref 1.5–4.5)
Glucose: 99 mg/dL (ref 70–99)
Potassium: 4.8 mmol/L (ref 3.5–5.2)
Sodium: 141 mmol/L (ref 134–144)
Total Protein: 6.9 g/dL (ref 6.0–8.5)
eGFR: 67 mL/min/{1.73_m2} (ref >59)

## 2023-04-14 LAB — CBC
Hematocrit: 40.3 % (ref 34.0–46.6)
Hemoglobin: 13.4 g/dL (ref 11.1–15.9)
MCH: 32.1 pg (ref 26.6–33.0)
MCHC: 33.3 g/dL (ref 31.5–35.7)
MCV: 97 fL (ref 79–97)
Platelets: 283 10*3/uL (ref 150–450)
RBC: 4.17 x10E6/uL (ref 3.77–5.28)
RDW: 11.8 % (ref 11.7–15.4)
WBC: 5.9 10*3/uL (ref 3.4–10.8)

## 2023-04-14 LAB — ATN PROFILE
A -- Beta-amyloid 42/40 Ratio: 0.094 — ABNORMAL LOW (ref >0.102)
Beta-amyloid 40: 206.38 pg/mL
Beta-amyloid 42: 19.31 pg/mL
N -- NfL, Plasma: 5.32 pg/mL (ref 0.00–11.55)
T -- p-tau181: 2.12 pg/mL — ABNORMAL HIGH (ref 0.00–0.97)

## 2023-04-14 LAB — VITAMIN D 1,25 DIHYDROXY
Vitamin D 1, 25 (OH)2 Total: 46 pg/mL
Vitamin D2 1, 25 (OH)2: <10 pg/mL
Vitamin D3 1, 25 (OH)2: 46 pg/mL

## 2023-04-14 LAB — TSH: TSH: 4.93 u[IU]/mL — ABNORMAL HIGH (ref 0.450–4.500)

## 2023-04-14 LAB — B12 AND FOLATE PANEL
Folate: >20 ng/mL (ref >3.0)
Vitamin B-12: 653 pg/mL (ref 232–1245)

## 2023-04-14 LAB — LDL CHOLESTEROL, DIRECT: LDL Direct: 82 mg/dL (ref 0–99)

## 2023-04-14 MED ORDER — ROSUVASTATIN CALCIUM 20 MG PO TABS: 20.0000 mg | ORAL_TABLET | Freq: Every day | ORAL | 3 refills | Status: DC

## 2023-04-14 NOTE — Telephone Encounter (Signed)
Spoke with pt and husband, aware of labs results, new medications and need for repeat labs. New script sent to the pharmacy  Lab orders mailed to the pt

## 2023-04-14 NOTE — Telephone Encounter (Signed)
Patient returning call to nurse

## 2023-04-27 ENCOUNTER — Encounter: Payer: Medicare Other | Admitting: Psychology

## 2023-05-03 ENCOUNTER — Encounter: Payer: Medicare Other | Admitting: Internal Medicine

## 2023-05-05 ENCOUNTER — Encounter (HOSPITAL_BASED_OUTPATIENT_CLINIC_OR_DEPARTMENT_OTHER): Payer: Self-pay

## 2023-05-05 DIAGNOSIS — R7989 Other specified abnormal findings of blood chemistry: Secondary | ICD-10-CM

## 2023-05-05 LAB — T4, FREE: Free T4: 0.97 ng/dL (ref 0.82–1.77)

## 2023-05-05 LAB — T3: T3, Total: 104 ng/dL (ref 71–180)

## 2023-05-10 ENCOUNTER — Encounter: Payer: Medicare Other | Admitting: Internal Medicine

## 2023-05-24 ENCOUNTER — Encounter: Payer: Medicare Other | Admitting: Internal Medicine

## 2023-05-31 ENCOUNTER — Non-Acute Institutional Stay: Admitting: Internal Medicine

## 2023-05-31 ENCOUNTER — Encounter: Admitting: Internal Medicine

## 2023-05-31 ENCOUNTER — Encounter: Payer: Self-pay | Admitting: Internal Medicine

## 2023-05-31 VITALS — BP 117/87 | HR 62 | Temp 95.6°F | Resp 18 | Ht 61.0 in | Wt 131.6 lb

## 2023-05-31 DIAGNOSIS — M810 Age-related osteoporosis without current pathological fracture: Secondary | ICD-10-CM | POA: Diagnosis not present

## 2023-05-31 DIAGNOSIS — F02A0 Dementia in other diseases classified elsewhere, mild, without behavioral disturbance, psychotic disturbance, mood disturbance, and anxiety: Secondary | ICD-10-CM

## 2023-05-31 DIAGNOSIS — R42 Dizziness and giddiness: Secondary | ICD-10-CM

## 2023-05-31 DIAGNOSIS — Z1231 Encounter for screening mammogram for malignant neoplasm of breast: Secondary | ICD-10-CM

## 2023-05-31 DIAGNOSIS — G301 Alzheimer's disease with late onset: Secondary | ICD-10-CM

## 2023-05-31 DIAGNOSIS — I10 Essential (primary) hypertension: Secondary | ICD-10-CM

## 2023-05-31 DIAGNOSIS — E7800 Pure hypercholesterolemia, unspecified: Secondary | ICD-10-CM

## 2023-05-31 DIAGNOSIS — I214 Non-ST elevation (NSTEMI) myocardial infarction: Secondary | ICD-10-CM

## 2023-05-31 NOTE — Progress Notes (Signed)
 Location:  [facility] of Service:  Clinic ([id number])  Provider:   Code Status:  Goals of Care:     05/31/2023    9:00 AM  Advanced Directives  Does Patient Have a Medical Advance Directive? No  Would patient like information on creating a medical advance directive? No - Patient declined     Chief Complaint  Patient presents with   Medical Management of Chronic Issues    3 month follow-up needs to discuss Medicare Annual Wellness Visit    HPI: Patient is a 83 y.o. female seen today for medical management of chronic diseases.    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  History of Present Illness   The patient, with a history of Alzheimer's disease and osteoporosis,  Dizziness presents with dizziness and uneasiness while walking. 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 She has an upcoming appointment with her ophthalmologist to address these issues.    The patient also reports feeling cold and tired often, and has a tendency to nap after breakfast and lunch, and sometimes in the late afternoon. She has been having trouble falling asleep and staying asleep at night, and reports experiencing nightmares.   The patient has not had any falls, but does express a fear of falling and tends to hold onto her spouse's arm, especially when walking on rough terrain. She has not considered using a cane or walker.   The patient's memory issues have been managed with Aricept and Namenda, but she has recently started experiencing dizziness again.   She has been  on Fosamax for osteoporosis for several years, but there is consideration of discontinuing this medication.      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/25/2019   Cataract    unsure of which eye   Colon polyps    Environmental allergies    Essential hypertension 06/25/2019   Genetic testing 04/28/2017   Multi-Cancer panel (83 genes) @ [facility] - No pathogenic mutations detected   GERD (gastroesophageal reflux disease)    Glaucoma    High cholesterol    Hypertension    IBS (irritable bowel syndrome)    Memory change    Osteoporosis 06/2016   T score -2.8   PONV (postoperative nausea and vomiting)    Pure hypercholesterolemia 06/25/2019   Seizures (HCC)    childhood, 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    Past Surgical History:  Procedure Laterality Date   ABDOMINAL HYSTERECTOMY     BLADDER SUSPENSION N/A 04/08/2016   Procedure: TRANSVAGINAL TAPE (TVT) PROCEDURE;  Surgeon: Genia Del, MD;  Location: [facility];  Service: Gynecology;  Laterality: N/A;   BREAST SURGERY     cyst removal   CARDIAC CATHETERIZATION     CATARACT EXTRACTION, BILATERAL     COLONOSCOPY     CORONARY STENT INTERVENTION N/A 12/08/2022   Procedure: CORONARY STENT INTERVENTION;  Surgeon: Corky Crafts, MD;  Location: [facility];  Service:  Cardiovascular;  Laterality: N/A;   CYSTOSCOPY N/A 04/08/2016   Procedure: CYSTOSCOPY;  Surgeon: Genia Del, MD;  Location: [facility];  Service: Gynecology;  Laterality: N/A;   DENTAL SURGERY  03/31/2016   ELBOW FRACTURE SURGERY     EYE SURGERY Left    blood clot on retina   LEFT HEART CATH AND CORONARY ANGIOGRAPHY N/A 12/08/2022   Procedure: LEFT HEART CATH AND CORONARY ANGIOGRAPHY;  Surgeon: Corky Crafts, MD;  Location: [facility];  Service: Cardiovascular;  Laterality: N/A;   ROBOTIC ASSISTED TOTAL HYSTERECTOMY WITH BILATERAL SALPINGO  OOPHERECTOMY Bilateral 04/08/2016   Procedure: ROBOTIC ASSISTED TOTAL HYSTERECTOMY WITH BILATERAL SALPINGO OOPHORECTOMY/Uterosacral Ligament Suspension;  Surgeon: Genia Del, MD;  Location: [facility];  Service: Gynecology;  Laterality: Bilateral;  Request 4 hrs.   TUBAL LIGATION     WRIST ARTHROSCOPY      Allergies  Allergen Reactions   Codeine Other (See Comments)    Knocked her out Other reaction(s): Unknown   Meloxicam Other (See Comments)    BLURRY VISION, DIZZY, CONFUSION  Other reaction(s): Unknown   Sulfamethoxazole-Trimethoprim     Unknown reaction   Tramadol Other (See Comments)    CONFUSION     Outpatient Encounter Medications as of 05/31/2023  Medication Sig   alendronate (FOSAMAX) 70 MG tablet TAKE 1 TABLET BY MOUTH EVERY 7 DAYS WITH A FULL GLASS OF WATER ON AN EMPTY STOMACH   aspirin 81 MG chewable tablet Chew 1 tablet (81 mg total) by mouth daily.   calcium carbonate (CALCIUM 600) 600 MG TABS tablet Take 600 mg by mouth daily with breakfast.   clopidogrel (PLAVIX) 75 MG tablet Take 1 tablet (75 mg total) by mouth daily.   donepezil (ARICEPT) 10 MG tablet Take 1 tablet (10 mg total) by mouth daily.   ezetimibe (ZETIA) 10 MG tablet Take 1 tablet (10 mg total) by mouth daily.   Latanoprost PF 0.005 % SOLN Place 1 drop into both eyes at bedtime.   memantine (NAMENDA) 5 MG tablet Take 1 tablet (5 mg total) by mouth 2 (two) times daily.   metoprolol succinate (TOPROL-XL) 25 MG 24 hr tablet Take 0.5 tablets (12.5 mg total) by mouth daily. Take with or immediately following a meal.   Multiple Vitamin (MULTIVITAMIN WITH MINERALS) TABS tablet Take 1 tablet by mouth daily.   rosuvastatin (CRESTOR) 20 MG tablet Take 1 tablet (20 mg total) by mouth daily.   VALSARTAN PO Take 20 mg by mouth daily.   No facility-administered encounter medications on file as of 05/31/2023.    Review of Systems:  Review of Systems  Constitutional:  Negative for activity change and appetite  change.  HENT: Negative.    Respiratory:  Negative for cough and shortness of breath.   Cardiovascular:  Negative for leg swelling.  Gastrointestinal:  Negative for constipation.  Genitourinary: Negative.   Musculoskeletal:  Positive for gait problem. Negative for arthralgias and myalgias.  Skin: Negative.   Neurological:  Positive for dizziness. Negative for weakness.  Psychiatric/Behavioral:  Positive for confusion and sleep disturbance. Negative for dysphoric mood.     Health Maintenance  Topic Date Due   Medicare Annual Wellness (AWV)  Never done   INFLUENZA VACCINE  10/20/2022   COVID-19 Vaccine (6 - 2024-25 season) 11/20/2022   DTaP/Tdap/Td (3 - Td or Tdap) 07/05/2029   Pneumonia Vaccine 49+ Years old  Completed   DEXA SCAN  Completed   Zoster Vaccines- Shingrix  Completed   HPV VACCINES  Aged  Out    Physical Exam: Vitals:   05/31/23 0901  BP: 117/87  Pulse: 62  Resp: 18  Temp: (!) 95.6 F (35.3 C)  SpO2: 96%  Weight: 131 lb 9.6 oz (59.7 kg)  Height: 5\' 1"  (1.549 m)   Body mass index is 24.87 kg/m. Physical Exam Vitals reviewed.  Constitutional:      Appearance: Normal appearance.  HENT:     Head: Normocephalic.     Right Ear: Tympanic membrane normal.     Left Ear: Tympanic membrane normal.     Nose: Nose normal.     Mouth/Throat:     Mouth: Mucous membranes are moist.     Pharynx: Oropharynx is clear.  Eyes:     Pupils: Pupils are equal, round, and reactive to light.  Cardiovascular:     Rate and Rhythm: Normal rate and regular rhythm.     Pulses: Normal pulses.     Heart sounds: Normal heart sounds. No murmur heard. Pulmonary:     Effort: Pulmonary effort is normal.     Breath sounds: Normal breath sounds.  Abdominal:     General: Abdomen is flat. Bowel sounds are normal.     Palpations: Abdomen is soft.  Musculoskeletal:        General: No swelling.     Cervical back: Neck supple.  Skin:    General: Skin is warm.  Neurological:      General: No focal deficit present.     Mental Status: She is alert.  Psychiatric:        Mood and Affect: Mood normal.        Thought Content: Thought content normal.     Labs reviewed: Basic Metabolic Panel: Recent Labs    06/16/22 0800 11/03/22 1015 11/03/22 1220 11/04/22 0447 12/08/22 0620 12/09/22 0830 04/03/23 1558  NA 141   < >  --    < > 141 138 141  K 3.9   < >  --    < > 3.3* 3.6 4.8  CL 106   < >  --    < > 108 109 103  CO2 25   < >  --    < > 20* 19* 25  GLUCOSE 102*   < >  --    < > 93 99 99  BUN 20   < >  --    < > 15 12 23   CREATININE 0.70   < >  --    < > 0.72 0.78 0.86  CALCIUM 8.8   < >  --    < > 8.0* 8.3* 9.2  MG  --   --  2.0  --   --   --   --   TSH 5.42*  --  3.349  --   --   --  4.930*   < > = values in this interval not displayed.   Liver Function Tests: Recent Labs    11/03/22 1015 12/07/22 2152 04/03/23 1558  AST 18 27 20   ALT 15 16 17   ALKPHOS 33* 41 58  BILITOT 0.5 0.5 <0.2  PROT 6.0* 6.1* 6.9  ALBUMIN 3.3* 3.2* 4.4   Recent Labs    12/07/22 2152  LIPASE 47   No results for input(s): "AMMONIA" in the last 8760 hours. CBC: Recent Labs    06/16/22 0800 11/03/22 1015 11/04/22 0447 12/07/22 2152 12/08/22 0620 12/09/22 0830 04/03/23 1558  WBC 4.5 5.0   < > 8.5 5.6 5.7  5.9  NEUTROABS 2,916 3.4  --  6.4  --   --   --   HGB 12.9 12.5   < > 12.5 11.5* 11.3* 13.4  HCT 37.9 37.0   < > 37.7 35.8* 33.6* 40.3  MCV 95.0 95.6   < > 97.2 98.6 95.7 97  PLT 277 249   < > 288 233 246 283   < > = values in this interval not displayed.   Lipid Panel: Recent Labs    06/16/22 0800 04/03/23 1558  CHOL 150  --   HDL 52  --   LDLCALC 75  --   TRIG 157*  --   CHOLHDL 2.9  --   LDLDIRECT  --  82   No results found for: "HGBA1C"  Procedures since last visit: No results found.  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 As Namenda was recently added  - Delay Namenda dosage increase for 1-2 weeks. - Consult ophthalmologist regarding eye issues. - Consider vestibular therapy if symptoms persist.  Alzheimer's Disease Alzheimer's confirmed. Aricept and Namenda  . Nightmares likely due to evening Aricept dosing. - Continue Aricept and Namenda. - Switch Aricept to morning dosing to reduce nightmares. - Encourage sunlight exposure and physical activity. - Consider melatonin 1 mg for sleep if needed.  Osteoporosis Osteoporosis managed with Fosamax since 2022. Per Notes she was on Fosamax for any years before Changed ot Prolia which was changed to fosamax again due to Cost   Bone density test needed to assess current bone health and treatment plan. - Order bone density test. - Evaluate results to determine continuation of Fosamax or switch to Prolia. NSTEMI (non-ST elevated myocardial infarction) (HCC)  Dual Therapy with Aspirin and Plavix On Zetia and statin Essential hypertension On Valsartan now Some VT and SVT on Monitor and was restarted on Metoprolol  Pure hypercholesterolemia On statin and Zetia  General Health Maintenance Vaccinations up to date. Routine screenings for breast cancer and bone density needed. - Order mammogram. - Encourage regular exercise and sunlight exposure.  Follow-up Follow-up needed based on test results and ongoing management. - Schedule follow-up appointment in 6 months with blood work. - Call [facility] to schedule mammogram and bone density test together.         Labs/tests ordered:  * No order type specified * Next appt:  Visit date not found

## 2023-06-07 ENCOUNTER — Telehealth: Payer: Self-pay | Admitting: Neurology

## 2023-06-07 NOTE — Telephone Encounter (Signed)
 LVM and sent MyChart msg informing pt of r/s needed for 8/7 appt- Maralyn Sago out.

## 2023-06-23 LAB — HM DEXA SCAN

## 2023-06-28 LAB — HEPATIC FUNCTION PANEL
ALT: 16 IU/L (ref 0–32)
AST: 20 IU/L (ref 0–40)
Albumin: 4.2 g/dL (ref 3.7–4.7)
Alkaline Phosphatase: 53 IU/L (ref 44–121)
Bilirubin Total: 0.4 mg/dL (ref 0.0–1.2)
Bilirubin, Direct: 0.15 mg/dL (ref 0.00–0.40)
Total Protein: 6.5 g/dL (ref 6.0–8.5)

## 2023-06-28 LAB — LIPID PANEL
Chol/HDL Ratio: 2.4 ratio (ref 0.0–4.4)
Cholesterol, Total: 136 mg/dL (ref 100–199)
HDL: 57 mg/dL (ref >39)
LDL Chol Calc (NIH): 50 mg/dL (ref 0–99)
Triglycerides: 174 mg/dL — ABNORMAL HIGH (ref 0–149)
VLDL Cholesterol Cal: 29 mg/dL (ref 5–40)

## 2023-07-05 ENCOUNTER — Ambulatory Visit: Admitting: Internal Medicine

## 2023-07-05 ENCOUNTER — Encounter: Payer: Self-pay | Admitting: Internal Medicine

## 2023-07-05 VITALS — BP 114/64 | HR 64 | Temp 97.0°F | Resp 16 | Ht 61.0 in | Wt 132.1 lb

## 2023-07-05 DIAGNOSIS — M810 Age-related osteoporosis without current pathological fracture: Secondary | ICD-10-CM | POA: Diagnosis not present

## 2023-07-05 NOTE — Progress Notes (Signed)
 Location:  [facility] of Service:   Clinic  Provider:   Code Status:  Goals of Care:     05/31/2023    9:00 AM  Advanced Directives  Does Patient Have a Medical Advance Directive? No  Would patient like information on creating a medical advance directive? No - Patient declined     Chief Complaint  Patient presents with   Results    Patient wants to discuss bone density results.    HPI: Patient is a 83 y.o. 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   NSTEMI in 12/09/2022 Syncope with Mirian Ames cardia in 10/2022  Patient was on Prolia which was changed to Fosamax in 05/18/21 per her request due to Cost Comparing her DEXA her T Score has gone down Right Femur is now  -2.7 from -2.2 And Left Femur us  now -3.0 from -2.7    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/25/2019   Cataract    unsure of which eye   Colon polyps    Environmental allergies    Essential hypertension 06/25/2019   Genetic testing 04/28/2017   Multi-Cancer panel (83 genes) @ [facility] - No pathogenic mutations detected   GERD (gastroesophageal reflux disease)    Glaucoma    High cholesterol    Hypertension    IBS (irritable bowel syndrome)    Memory change    Osteoporosis 06/2016   T score -2.8   PONV (postoperative nausea and vomiting)    Pure hypercholesterolemia 06/25/2019   Seizures (HCC)    childhood, 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    Past Surgical History:  Procedure Laterality Date   ABDOMINAL HYSTERECTOMY     BLADDER SUSPENSION N/A 04/08/2016   Procedure: TRANSVAGINAL TAPE (TVT) PROCEDURE;  Surgeon: Percy Bracken, MD;  Location: [facility];  Service:  Gynecology;  Laterality: N/A;   BREAST SURGERY     cyst removal   CARDIAC CATHETERIZATION     CATARACT EXTRACTION, BILATERAL     COLONOSCOPY     CORONARY STENT INTERVENTION N/A 12/08/2022   Procedure: CORONARY STENT INTERVENTION;  Surgeon: Lucendia Rusk, MD;  Location: [facility];  Service: Cardiovascular;  Laterality: N/A;   CYSTOSCOPY N/A 04/08/2016   Procedure: CYSTOSCOPY;  Surgeon: Percy Bracken, MD;  Location: [facility];  Service: Gynecology;  Laterality: N/A;   DENTAL SURGERY  03/31/2016   ELBOW FRACTURE SURGERY     EYE SURGERY Left    blood clot on retina   LEFT HEART CATH AND CORONARY ANGIOGRAPHY N/A 12/08/2022   Procedure: LEFT HEART CATH AND CORONARY ANGIOGRAPHY;  Surgeon: Lucendia Rusk, MD;  Location: [facility] INVASIVE CV LAB;  Service: Cardiovascular;  Laterality: N/A;   ROBOTIC ASSISTED TOTAL HYSTERECTOMY WITH BILATERAL SALPINGO OOPHERECTOMY Bilateral 04/08/2016   Procedure: ROBOTIC ASSISTED TOTAL HYSTERECTOMY WITH BILATERAL SALPINGO OOPHORECTOMY/Uterosacral Ligament Suspension;  Surgeon: Marie-Lyne Lavoie, MD;  Location: [facility];  Service: Gynecology;  Laterality: Bilateral;  Request 4 hrs.   TUBAL LIGATION     WRIST ARTHROSCOPY      Allergies  Allergen Reactions   Codeine Other (See Comments)    Knocked her out Other reaction(s): Unknown  Meloxicam Other (See Comments)    BLURRY VISION, DIZZY, CONFUSION  Other reaction(s): Unknown   Sulfamethoxazole-Trimethoprim     Unknown reaction   Tramadol Other (See Comments)    CONFUSION     Outpatient Encounter Medications as of 07/05/2023  Medication Sig   alendronate (FOSAMAX) 70 MG tablet TAKE 1 TABLET BY MOUTH EVERY 7 DAYS WITH A FULL GLASS OF WATER ON AN EMPTY STOMACH   aspirin 81 MG chewable tablet Chew 1 tablet (81 mg total) by mouth daily.   calcium carbonate (CALCIUM 600) 600 MG TABS tablet Take 600 mg by mouth daily with breakfast.   clopidogrel (PLAVIX) 75 MG tablet Take 1 tablet (75 mg total) by  mouth daily.   donepezil (ARICEPT) 10 MG tablet Take 1 tablet (10 mg total) by mouth daily.   ezetimibe (ZETIA) 10 MG tablet Take 1 tablet (10 mg total) by mouth daily.   Latanoprost PF 0.005 % SOLN Place 1 drop into both eyes at bedtime.   memantine (NAMENDA) 5 MG tablet Take 1 tablet (5 mg total) by mouth 2 (two) times daily.   metoprolol succinate (TOPROL-XL) 25 MG 24 hr tablet Take 0.5 tablets (12.5 mg total) by mouth daily. Take with or immediately following a meal.   Multiple Vitamin (MULTIVITAMIN WITH MINERALS) TABS tablet Take 1 tablet by mouth daily.   rosuvastatin (CRESTOR) 20 MG tablet Take 1 tablet (20 mg total) by mouth daily.   VALSARTAN PO Take 20 mg by mouth daily.   No facility-administered encounter medications on file as of 07/05/2023.    Review of Systems:  Review of Systems  Constitutional:  Negative for activity change and appetite change.  HENT: Negative.    Respiratory:  Negative for cough and shortness of breath.   Cardiovascular:  Negative for leg swelling.  Gastrointestinal:  Negative for constipation.  Genitourinary: Negative.   Musculoskeletal:  Positive for gait problem. Negative for arthralgias and myalgias.  Skin: Negative.   Neurological:  Positive for dizziness. Negative for weakness.  Psychiatric/Behavioral:  Positive for confusion. Negative for dysphoric mood and sleep disturbance.     Health Maintenance  Topic Date Due   Medicare Annual Wellness (AWV)  Never done   COVID-19 Vaccine (6 - Pfizer risk 2024-25 season) 06/18/2023   INFLUENZA VACCINE  10/20/2023   DTaP/Tdap/Td (3 - Td or Tdap) 07/05/2029   Pneumonia Vaccine 69+ Years old  Completed   DEXA SCAN  Completed   Zoster Vaccines- Shingrix  Completed   HPV VACCINES  Aged Out   Meningococcal B Vaccine  Aged Out    Physical Exam: Vitals:   07/05/23 1320  BP: 114/64  Pulse: 64  Resp: 16  Temp: (!) 97 F (36.1 C)  SpO2: 98%  Weight: 132 lb 1.6 oz (59.9 kg)  Height: 5\' 1"  (1.549 m)    Body mass index is 24.96 kg/m. Physical Exam Vitals reviewed.  Constitutional:      Appearance: Normal appearance.  HENT:     Head: Normocephalic.     Right Ear: Tympanic membrane normal.     Left Ear: Tympanic membrane normal.     Nose: Nose normal.     Mouth/Throat:     Mouth: Mucous membranes are moist.     Pharynx: Oropharynx is clear.  Eyes:     Pupils: Pupils are equal, round, and reactive to light.  Musculoskeletal:     Cervical back: Neck supple.  Skin:    General: Skin is warm.  Neurological:  General: No focal deficit present.     Mental Status: She is alert.     Labs reviewed: Basic Metabolic Panel: Recent Labs    11/03/22 1220 11/04/22 0447 12/08/22 0620 12/09/22 0830 04/03/23 1558  NA  --    < > 141 138 141  K  --    < > 3.3* 3.6 4.8  CL  --    < > 108 109 103  CO2  --    < > 20* 19* 25  GLUCOSE  --    < > 93 99 99  BUN  --    < > 15 12 23   CREATININE  --    < > 0.72 0.78 0.86  CALCIUM  --    < > 8.0* 8.3* 9.2  MG 2.0  --   --   --   --   TSH 3.349  --   --   --  4.930*   < > = values in this interval not displayed.   Liver Function Tests: Recent Labs    12/07/22 2152 04/03/23 1558 06/27/23 0822  AST 27 20 20   ALT 16 17 16   ALKPHOS 41 58 53  BILITOT 0.5 <0.2 0.4  PROT 6.1* 6.9 6.5  ALBUMIN 3.2* 4.4 4.2   Recent Labs    12/07/22 2152  LIPASE 47   No results for input(s): "AMMONIA" in the last 8760 hours. CBC: Recent Labs    11/03/22 1015 11/04/22 0447 12/07/22 2152 12/08/22 0620 12/09/22 0830 04/03/23 1558  WBC 5.0   < > 8.5 5.6 5.7 5.9  NEUTROABS 3.4  --  6.4  --   --   --   HGB 12.5   < > 12.5 11.5* 11.3* 13.4  HCT 37.0   < > 37.7 35.8* 33.6* 40.3  MCV 95.6   < > 97.2 98.6 95.7 97  PLT 249   < > 288 233 246 283   < > = values in this interval not displayed.   Lipid Panel: Recent Labs    04/03/23 1558 06/27/23 0822  CHOL  --  136  HDL  --  57  LDLCALC  --  50  TRIG  --  174*  CHOLHDL  --  2.4  LDLDIRECT  82  --    No results found for: "HGBA1C"  Procedures since last visit: No results found.  Assessment/Plan 1. Osteoporosis without current pathological fracture, unspecified osteoporosis type (Primary) Discussed with the patient She was Switched from Prolia to Fosamax 2 years ago Since then has had worsening T score She never had any side effects from Prolia and is willing to restart it Will have our office reach out to her for Prolia     Labs/tests ordered:  * No order type specified * Next appt:  11/27/2023

## 2023-07-06 ENCOUNTER — Telehealth: Payer: Self-pay | Admitting: *Deleted

## 2023-07-06 DIAGNOSIS — M810 Age-related osteoporosis without current pathological fracture: Secondary | ICD-10-CM

## 2023-07-06 MED ORDER — DENOSUMAB 60 MG/ML ~~LOC~~ SOSY
60.0000 mg | PREFILLED_SYRINGE | Freq: Once | SUBCUTANEOUS | Status: AC
  Administered 2023-10-17: 60 mg via SUBCUTANEOUS

## 2023-07-06 NOTE — Telephone Encounter (Signed)
-----   Message from Marguerite Shiley sent at 07/05/2023  1:38 PM EDT ----- Dovie Gell, Is there anyway you can get her Approved for Prolia ASAP. ? She has failed Fosamax. And I want to restart her on Prolia. She was on it before Once you have the Approval and date I can order Labs for her.  Thanks   [employer]

## 2023-07-06 NOTE — Telephone Encounter (Signed)
Verification submitted to [employer] ?

## 2023-07-13 ENCOUNTER — Telehealth: Payer: Self-pay

## 2023-07-13 NOTE — Telephone Encounter (Signed)
 Copied from CRM [id number]. Topic: Medical Record Request - Other >> Jul 13, 2023  2:38 PM Tisa Forester wrote: Reason for CRM:  shenequia call from [employer] support plus benefit verification for prolia  , need verification of patient insurance and member Id  Call back number: [phone] or can over front and back of the insurance card fax  Will also send a letter and fax to the office of missing insurance information  Fax number [phone] Patient [employer] number : [phone]  Message sent t

## 2023-07-19 NOTE — Telephone Encounter (Signed)
 [employer] Verification came back stating they they need updated insurance information.   The insurance on file in Epic is what [employer] has in their system. Messaged Bethany since she is at the [facility] today to see if she could get an updated insurance card.

## 2023-07-27 NOTE — Telephone Encounter (Addendum)
 Insurance updated through [employer] and spoke with Connell Degree and patient's OOP cost is 10% and 10% Admin fee= Copay $167  Last Prolia  in Epic was 11/26/2020  Needs order and appointment scheduled for labwork.   Please Advise.

## 2023-07-31 NOTE — Telephone Encounter (Signed)
 Called [employer] [phone] and spoke with Merlyn Starring and she stated that a Prior Authorization for Prolia  is not Required. Ref #: [id number]    Called and spoke with Husband and scheduled an appointment for Prolia  for 08/11/2023. Aware of Copay of $167   Forwarded to Dr. Venice Gillis to order Labs.

## 2023-08-03 NOTE — Telephone Encounter (Signed)
 Patient Called and CANCELED Prolia  Appointment.

## 2023-08-11 ENCOUNTER — Ambulatory Visit

## 2023-08-22 ENCOUNTER — Telehealth: Payer: Self-pay | Admitting: *Deleted

## 2023-08-22 NOTE — Telephone Encounter (Signed)
 Copied from CRM [id number]. Topic: General - Other >> Aug 22, 2023  2:37 PM Retta Caster wrote: Reason for CRM: Patient husband Rich Champ needs cal back on if Lab need to be done before her prolia  shot injection . Also update on when medication is in office for scheduling app. Patient have labs for 09/08 but needs clarification if needed sooner before getting injection. Needs call back [phone]    Is it ok to go ahead and get the labs done sooner than 9/8 or do you need to place a sooner lab order to just check Calcium ? Please Advise.

## 2023-08-23 NOTE — Telephone Encounter (Signed)
 Did she get her First prolia  shot already ? Also when is it due for Prolia  ?

## 2023-08-23 NOTE — Telephone Encounter (Signed)
 She Had an appointment to get her Prolia  on 08/11/2023 and Canceled.

## 2023-09-14 ENCOUNTER — Other Ambulatory Visit (HOSPITAL_BASED_OUTPATIENT_CLINIC_OR_DEPARTMENT_OTHER): Payer: Self-pay | Admitting: Family

## 2023-09-14 DIAGNOSIS — E785 Hyperlipidemia, unspecified: Secondary | ICD-10-CM

## 2023-09-14 DIAGNOSIS — I25118 Atherosclerotic heart disease of native coronary artery with other forms of angina pectoris: Secondary | ICD-10-CM

## 2023-09-14 DIAGNOSIS — I10 Essential (primary) hypertension: Secondary | ICD-10-CM

## 2023-09-14 DIAGNOSIS — R252 Cramp and spasm: Secondary | ICD-10-CM

## 2023-09-21 NOTE — Telephone Encounter (Addendum)
 Sandra Fredia CROME, MD to Me     08/23/23 10:40 AM We just have to BMP before the Prolia  . We can cancel her Lab appointment in sept. That way I can order labs depending on her Prolia  schedule.

## 2023-09-21 NOTE — Telephone Encounter (Addendum)
 Called and spoke with husband. He stated that he was busy at the moment and to call back next week.   Calling to schedule Lab appointment for [facility] (to Cancel the Labs in Sept per Dr. Charlanne and have done now) and to reschedule the Prolia  Injection. Aware of Copay of [phone]

## 2023-10-10 NOTE — Telephone Encounter (Signed)
 Called and spoke with Husband and scheduled labs for 7/24 and for her Prolia  Injection 7/29  Lab appointment canceled for Sept.  Please place orders .

## 2023-10-11 ENCOUNTER — Encounter: Payer: Self-pay | Admitting: Internal Medicine

## 2023-10-12 LAB — CBC WITH DIFFERENTIAL/PLATELET
Absolute Lymphocytes: 1279 {cells}/uL (ref 850–3900)
Absolute Monocytes: 363 {cells}/uL (ref 200–950)
Basophils Absolute: 41 {cells}/uL (ref 0–200)
Basophils Relative: 0.9 %
Eosinophils Absolute: 161 {cells}/uL (ref 15–500)
Eosinophils Relative: 3.5 %
HCT: 39 % (ref 35.0–45.0)
Hemoglobin: 12.7 g/dL (ref 11.7–15.5)
MCH: 31.4 pg (ref 27.0–33.0)
MCHC: 32.6 g/dL (ref 32.0–36.0)
MCV: 96.5 fL (ref 80.0–100.0)
MPV: 8.7 fL (ref 7.5–12.5)
Monocytes Relative: 7.9 %
Neutro Abs: 2755 {cells}/uL (ref 1500–7800)
Neutrophils Relative %: 59.9 %
Platelets: 234 Thousand/uL (ref 140–400)
RBC: 4.04 Million/uL (ref 3.80–5.10)
RDW: 12 % (ref 11.0–15.0)
Total Lymphocyte: 27.8 %
WBC: 4.6 Thousand/uL (ref 3.8–10.8)

## 2023-10-12 LAB — LIPID PANEL
Cholesterol: 122 mg/dL (ref <200)
HDL: 59 mg/dL (ref >=50)
LDL Cholesterol (Calc): 43 mg/dL
Non-HDL Cholesterol (Calc): 63 mg/dL (ref <130)
Total CHOL/HDL Ratio: 2.1 (calc) (ref <5.0)
Triglycerides: 122 mg/dL (ref <150)

## 2023-10-12 LAB — COMPREHENSIVE METABOLIC PANEL WITH GFR
AG Ratio: 1.6 (calc) (ref 1.0–2.5)
ALT: 19 U/L (ref 6–29)
AST: 19 U/L (ref 10–35)
Albumin: 3.9 g/dL (ref 3.6–5.1)
Alkaline phosphatase (APISO): 44 U/L (ref 37–153)
BUN: 20 mg/dL (ref 7–25)
CO2: 27 mmol/L (ref 20–32)
Calcium: 8.9 mg/dL (ref 8.6–10.4)
Chloride: 108 mmol/L (ref 98–110)
Creat: 0.85 mg/dL (ref 0.60–0.95)
Globulin: 2.5 g/dL (ref 1.9–3.7)
Glucose, Bld: 102 mg/dL — ABNORMAL HIGH (ref 65–99)
Potassium: 4.1 mmol/L (ref 3.5–5.3)
Sodium: 141 mmol/L (ref 135–146)
Total Bilirubin: 0.4 mg/dL (ref 0.2–1.2)
Total Protein: 6.4 g/dL (ref 6.1–8.1)
eGFR: 68 mL/min/1.73m2 (ref >=60)

## 2023-10-12 LAB — TSH: TSH: 3.99 m[IU]/L (ref 0.40–4.50)

## 2023-10-13 ENCOUNTER — Ambulatory Visit: Payer: Self-pay | Admitting: Internal Medicine

## 2023-10-17 ENCOUNTER — Ambulatory Visit: Admitting: Family

## 2023-10-17 DIAGNOSIS — M810 Age-related osteoporosis without current pathological fracture: Secondary | ICD-10-CM | POA: Diagnosis not present

## 2023-10-17 MED ORDER — DENOSUMAB 60 MG/ML ~~LOC~~ SOSY: 60.0000 mg | PREFILLED_SYRINGE | SUBCUTANEOUS | Status: AC

## 2023-10-17 NOTE — Progress Notes (Signed)
 Patient is in office today for a nurse visit for  Prolia . Patient Injection was given in the  Right arm. Patient tolerated injection well.

## 2023-10-24 ENCOUNTER — Emergency Department (HOSPITAL_COMMUNITY)

## 2023-10-24 ENCOUNTER — Encounter (HOSPITAL_COMMUNITY): Payer: Self-pay | Admitting: Emergency Medicine

## 2023-10-24 ENCOUNTER — Other Ambulatory Visit: Payer: Self-pay

## 2023-10-24 ENCOUNTER — Emergency Department (HOSPITAL_COMMUNITY)
Admission: EM | Admit: 2023-10-24 | Discharge: 2023-10-24 | Disposition: A | Discharge status: Home / Self Care | Attending: Emergency Medicine | Admitting: Emergency Medicine

## 2023-10-24 DIAGNOSIS — M79672 Pain in left foot: Secondary | ICD-10-CM | POA: Insufficient documentation

## 2023-10-24 DIAGNOSIS — Y9301 Activity, walking, marching and hiking: Secondary | ICD-10-CM | POA: Diagnosis not present

## 2023-10-24 DIAGNOSIS — Z79899 Other long term (current) drug therapy: Secondary | ICD-10-CM | POA: Diagnosis not present

## 2023-10-24 DIAGNOSIS — Z7982 Long term (current) use of aspirin: Secondary | ICD-10-CM | POA: Insufficient documentation

## 2023-10-24 DIAGNOSIS — R42 Dizziness and giddiness: Secondary | ICD-10-CM | POA: Diagnosis present

## 2023-10-24 DIAGNOSIS — W19XXXA Unspecified fall, initial encounter: Secondary | ICD-10-CM

## 2023-10-24 DIAGNOSIS — F0390 Unspecified dementia without behavioral disturbance: Secondary | ICD-10-CM | POA: Diagnosis not present

## 2023-10-24 DIAGNOSIS — Y92009 Unspecified place in unspecified non-institutional (private) residence as the place of occurrence of the external cause: Secondary | ICD-10-CM | POA: Diagnosis not present

## 2023-10-24 DIAGNOSIS — M25522 Pain in left elbow: Secondary | ICD-10-CM | POA: Diagnosis not present

## 2023-10-24 DIAGNOSIS — R001 Bradycardia, unspecified: Secondary | ICD-10-CM | POA: Insufficient documentation

## 2023-10-24 DIAGNOSIS — W010XXA Fall on same level from slipping, tripping and stumbling without subsequent striking against object, initial encounter: Secondary | ICD-10-CM | POA: Diagnosis not present

## 2023-10-24 DIAGNOSIS — S92356A Nondisplaced fracture of fifth metatarsal bone, unspecified foot, initial encounter for closed fracture: Secondary | ICD-10-CM

## 2023-10-24 DIAGNOSIS — M25552 Pain in left hip: Secondary | ICD-10-CM | POA: Insufficient documentation

## 2023-10-24 DIAGNOSIS — S42435A Nondisplaced fracture (avulsion) of lateral epicondyle of left humerus, initial encounter for closed fracture: Secondary | ICD-10-CM

## 2023-10-24 LAB — CBC
HCT: 40.7 % (ref 36.0–46.0)
Hemoglobin: 13.5 g/dL (ref 12.0–15.0)
MCH: 32.1 pg (ref 26.0–34.0)
MCHC: 33.2 g/dL (ref 30.0–36.0)
MCV: 96.7 fL (ref 80.0–100.0)
Platelets: 254 K/uL (ref 150–400)
RBC: 4.21 MIL/uL (ref 3.87–5.11)
RDW: 11.9 % (ref 11.5–15.5)
WBC: 6.9 K/uL (ref 4.0–10.5)
nRBC: 0 % (ref 0.0–0.2)

## 2023-10-24 LAB — BASIC METABOLIC PANEL WITH GFR
Anion gap: 9 (ref 5–15)
BUN: 16 mg/dL (ref 8–23)
CO2: 20 mmol/L — ABNORMAL LOW (ref 22–32)
Calcium: 8.7 mg/dL — ABNORMAL LOW (ref 8.9–10.3)
Chloride: 111 mmol/L (ref 98–111)
Creatinine, Ser: 0.68 mg/dL (ref 0.44–1.00)
GFR, Estimated: >60 mL/min (ref >60)
Glucose, Bld: 107 mg/dL — ABNORMAL HIGH (ref 70–99)
Potassium: 4.1 mmol/L (ref 3.5–5.1)
Sodium: 140 mmol/L (ref 135–145)

## 2023-10-24 MED ORDER — ACETAMINOPHEN 500 MG PO TABS
1000.0000 mg | ORAL_TABLET | Freq: Once | ORAL | Status: AC
  Administered 2023-10-24: 1000 mg via ORAL
  Filled 2023-10-24: qty 2

## 2023-10-24 MED ORDER — ACETAMINOPHEN 325 MG PO TABS
325.0000 mg | ORAL_TABLET | Freq: Once | ORAL | Status: AC
  Administered 2023-10-24: 325 mg via ORAL
  Filled 2023-10-24: qty 1

## 2023-10-24 NOTE — ED Notes (Signed)
 Patient transported to X-ray

## 2023-10-24 NOTE — ED Provider Notes (Signed)
 [place] EMERGENCY DEPARTMENT AT [facility] Provider Note   CSN: [id number] Arrival date & time: 10/24/23  1241     Patient presents with: Sandra Wheeler is a 83 y.o. female with PMH Dementia, Syncope due to possible orthostatic hypotension, NSTEMI, bradycardia presents after unwitnessed fall at home.  Patient is not on any blood thinners as it seems from current medication list. Patient is at baseline mentation per husband but was unsure if there was a head strike or loss of consciousness.Husband says that he found patient flat down on her face. He believes that she likely tripped due to walking with a blanket. Husband also has some peripheral vision loss, although unless sure which eye, that is chronic and could have contributed to fall.  Last time that she had a fall husband reports about couple years ago.  Patient was admitted to the hospital last year for syncope workup and it was found that the cause was likely orthostatic hypotension or chronotropic incompetence.  Husband reports that patient is supposed to be taking medication for low blood pressure but was not aware of the name of the medication and nothing was seen in medication last for this.  Husband reports that patient has not had any acute changes to mentation.  He does report that in the last couple months she has been more lethargic, sleeping more frequently, and has had decreased appetite.  Patient denied any shortness of breath, urinary symptoms.  Husband does report the patient has been getting some lightheaded,dizzy spells recently. Patient is not able to discern if this is what happened before her fall today, but does endorse that she does get dizzy at times over the last couple of months.    Fall       Prior to Admission medications   Medication Sig Start Date End Date Taking? Authorizing Provider  alendronate  (FOSAMAX ) 70 MG tablet TAKE 1 TABLET BY MOUTH EVERY 7 DAYS WITH A FULL GLASS OF WATER  ON AN  EMPTY STOMACH 01/03/23   Prentiss Riggs A, NP  aspirin  81 MG chewable tablet Chew 1 tablet (81 mg total) by mouth daily. 12/10/22   Vicci Rollo SAUNDERS, PA-C  calcium  carbonate (CALCIUM  600) 600 MG TABS tablet Take 600 mg by mouth daily with breakfast.    [provider]  clopidogrel  (PLAVIX ) 75 MG tablet Take 1 tablet (75 mg total) by mouth daily. 01/25/23   Lonni Slain, MD  donepezil  (ARICEPT ) 10 MG tablet Take 1 tablet (10 mg total) by mouth daily. 04/11/23   Gayland Lauraine PARAS, NP  ezetimibe  (ZETIA ) 10 MG tablet TAKE ONE TABLET BY MOUTH ONE TIME DAILY 09/18/23   Walker, Caitlin S, NP  Latanoprost PF 0.005 % SOLN Place 1 drop into both eyes at bedtime. 11/25/22   [provider]  memantine  (NAMENDA ) 5 MG tablet Take 1 tablet (5 mg total) by mouth 2 (two) times daily. 04/11/23   Gayland Lauraine PARAS, NP  metoprolol  succinate (TOPROL -XL) 25 MG 24 hr tablet Take 0.5 tablets (12.5 mg total) by mouth daily. Take with or immediately following a meal. 12/30/22   Walker, Caitlin S, NP  Multiple Vitamin (MULTIVITAMIN WITH MINERALS) TABS tablet Take 1 tablet by mouth daily.    [provider]  rosuvastatin  (CRESTOR ) 20 MG tablet Take 1 tablet (20 mg total) by mouth daily. 04/14/23 07/13/23  Vannie Reche RAMAN, NP  VALSARTAN  PO Take 20 mg by mouth daily.    [provider]  Allergies: Codeine, Meloxicam, Sulfamethoxazole-trimethoprim, and Tramadol    Review of Systems Negative unless otherwise stated in the HPI  Updated Vital Signs BP 100/78   Pulse (!) 59   Temp 98.1 F (36.7 C) (Oral)   Resp 14   Ht 5' 1 (1.549 m)   Wt 59.9 kg   SpO2 100%   BMI 24.95 kg/m   Physical Exam HENT:     Head:     Comments: No abrasions seen. Patient was in bed with C collar on When removed patient was not able to flex head, but no C spine tenderness on exam  Cardiovascular:     Rate and Rhythm: Regular rhythm. Bradycardia present.     Comments: Radial pulses intact  bilaterally  Pulmonary:     Effort: Pulmonary effort is normal.     Breath sounds: Normal breath sounds.  Abdominal:     General: Abdomen is flat.     Palpations: Abdomen is soft.     Tenderness: There is no abdominal tenderness.  Musculoskeletal:     Right lower leg: No edema.     Left lower leg: No edema.     Comments: Tenderness to palpation at left hip.  Tenderness at left elbow with palpation Guarding of left wrist with flexion and extension  Tenderness to left foot with palpation No tenderness to knees bilaterally or shoulders   Neurological:     Comments: A&O x1 per husband patient is at baseline      (all labs ordered are listed, but only abnormal results are displayed) Labs Reviewed  BASIC METABOLIC PANEL WITH GFR  CBC    EKG: EKG Interpretation Date/Time:  Tuesday October 24 2023 12:52:07 EDT Ventricular Rate:  57 PR Interval:  165 QRS Duration:  87 QT Interval:  467 QTC Calculation: 455 R Axis:   72  Text Interpretation: Sinus rhythm Probable anterior infarct, old No significant change since last tracing Confirmed by Doretha Folks ([id number]) on 10/24/2023 1:18:11 PM  Radiology: No results found.   Procedures   Medications Ordered in the ED - No data to display                                  Medical Decision Making Amount and/or Complexity of Data Reviewed Labs: ordered. Radiology: ordered.  Initial plan and assessment:  Differential for fall includes mechanical fall, vasovagal or orthostatic hypotension syncope, cardiac arrhythmia, CVA, MSK weakness, medication induced dizziness.   Patient has had history of dementia and husband reports that patient is at baseline mentation wise, A&Ox1 with some orientation to location at some points. Seems like per husband's description this seems to be a mechanical fall due to tripping and could be contributed by peripheral vision loss. Patient is unsure of what happened during fall, but has endorsed some dizzy  spells over the last couple of months.  Will get BMP and CBC to make sure patient is at baseline kidney function, electrolytes and no signs of anemia. Will get imaging to rule our any acute fractures, dislocation or head trauma. Imaging includes CT head without contrast, CT of cervical spine, X ray of left elbow, wrist and left foot. No signs of lacerations that needs to be fixed.   1341- Patient was complaining of pain at left elbow and was given tylenol  325 mg for this.   1500-foot fracture nondisplaced on metatarsal and lateral humeral epicondyle fracture on the left is new.  Fracture of the metatarsal require the patient home in hard soled shoe.  Will get patient to ambulate and see if she is still having pain.  If so we will to get CT noncontrast of the left hip to check for any fractures.Patient was also given tylenol  1000mg  for pain at this time.   1515- Patient does complain of left hip pain still. She is able to flex her left leg at the knee with full ROM. Will not pursue CT imaging at this time of the hip. Did call orthopedics to see what type of splint will be needed for lateral epicondyle fracture of left elbow and they recommended no splinting for this area.   Final assessment and plan: BMP did not show any acute abnormalities.  Stable electrolytes and kidney function at baseline.  CBC unremarkable with stable hemoglobin.  CT of cervical spine did not show any significant degenerative changes or acute fracture or traumatic malalignment.  Patient taken out of c-collar.  CT head did not show any acute intracranial abnormality showed some age related changes and cerebral volume loss.  X-ray of left hip and pelvis and Wrist x ray was clear.  X-ray of left foot showed nondisplaced fracture of the fifth metatarsal at the junction of the shaft and proximal metaphysis.  X-ray of left elbow shows slightly displaced fracture of the lateral humeral epicondyle and is status post orthopedic plate and pin  fixation of the olecranon. The foot fracture and lateral epicondyle fracture is new. Patient will need to go home in boot for foot fracture as it is non displaced. Patient will likely need to go home in wheelchair for transportation needs and husband said this can be arranged for her when he gets back to their living facility. Patient can take tylenol  for pain control at home. Discussed this plan with patient and husband.      Final diagnoses:  None    ED Discharge Orders     None          D'Mello, Akiya Morr, DO 10/24/23 1556    Doretha Folks, MD 10/25/23 [phone]

## 2023-10-24 NOTE — Discharge Instructions (Addendum)
 You came in because you had a fall. Since you were complaining of pain in your left elbow, hip and foot we took some x rays and other imaging. We also took some images of your head and spine due to some pain you were having there. Your left foot showed a fracture that is not displaced at your fifth metatarsal. Your left elbow also showed a new fracture on the lateral side of one of your bones. You will need to go home wearing a hard soled shoe for your left foot. You may also use a wheelchair if you need help getting around. Please take tylenol  at home for pain control.  Please call tomorrow to schedule an appointment with Dr. Kendal so that you can follow up about the new fractures we found.

## 2023-10-24 NOTE — ED Triage Notes (Addendum)
 Pt BIB [facility] from [facility] due to falling.  Pt was walking with fleece blankets and tripped over them and fell per husband.  Pt reports left arm pain (elbow area).  EMS C-collar in place.  Hx dementia. VS BP 152/74, HR 60, Resp 14, SpO2 98% RA CBG 102

## 2023-10-26 ENCOUNTER — Ambulatory Visit: Payer: Medicare Other | Admitting: Neurology

## 2023-11-27 ENCOUNTER — Other Ambulatory Visit

## 2023-11-29 ENCOUNTER — Non-Acute Institutional Stay: Admitting: Internal Medicine

## 2023-11-29 ENCOUNTER — Encounter: Payer: Self-pay | Admitting: Internal Medicine

## 2023-11-29 VITALS — BP 110/74 | HR 72 | Temp 97.3°F | Ht 61.0 in | Wt 130.1 lb

## 2023-11-29 DIAGNOSIS — I10 Essential (primary) hypertension: Secondary | ICD-10-CM | POA: Diagnosis not present

## 2023-11-29 DIAGNOSIS — E7800 Pure hypercholesterolemia, unspecified: Secondary | ICD-10-CM | POA: Diagnosis not present

## 2023-11-29 DIAGNOSIS — M810 Age-related osteoporosis without current pathological fracture: Secondary | ICD-10-CM

## 2023-11-29 DIAGNOSIS — F02B0 Dementia in other diseases classified elsewhere, moderate, without behavioral disturbance, psychotic disturbance, mood disturbance, and anxiety: Secondary | ICD-10-CM

## 2023-11-29 DIAGNOSIS — E785 Hyperlipidemia, unspecified: Secondary | ICD-10-CM

## 2023-11-29 DIAGNOSIS — Z23 Encounter for immunization: Secondary | ICD-10-CM

## 2023-11-29 DIAGNOSIS — I25118 Atherosclerotic heart disease of native coronary artery with other forms of angina pectoris: Secondary | ICD-10-CM

## 2023-11-29 DIAGNOSIS — G301 Alzheimer's disease with late onset: Secondary | ICD-10-CM

## 2023-11-29 MED ORDER — COVID-19 MRNA VACCINE (PFIZER) 30 MCG/0.3ML IM SUSP: 0.3000 mL | Freq: Once | INTRAMUSCULAR | 0 refills | Status: AC

## 2023-11-29 NOTE — Patient Instructions (Addendum)
 1.) Please call [phone] to schedule your annual wellness visit (video or in person)  2.)Please visit your local pharmacy to receive your covid and flu vaccines.  3.) Please have your labs done here at [facility] on 04/15/24 between 7:45-8:15.

## 2023-12-02 MED ORDER — ROSUVASTATIN CALCIUM 20 MG PO TABS: 20.0000 mg | ORAL_TABLET | Freq: Every day | ORAL | Status: DC

## 2023-12-02 NOTE — Progress Notes (Signed)
 Location:  [facility] of Service:  Clinic (12)  Provider:   Code Status:  Goals of Care:     10/24/2023   12:47 PM  Advanced Directives  Does Patient Have a Medical Advance Directive? No  Would patient like information on creating a medical advance directive? No - Patient declined     Chief Complaint  Patient presents with   Medical Management of Chronic Issues    6 month follow up. Had a fall since she was last here. ( Patient is doing PT) Need for covid, flu vaccines( plan to get both of these at the pharmacy, postponed) . Patient is also having some hives.    HPI: Patient is a 83 y.o. female seen today for medical management of chronic diseases.   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 Since her last visit with me patient has had profound progressive worsening of her cognition.  She was struggling a lot with her aphasia.  Could not tell me how old she is and her date of birth. By her husband patient is needing help with dressing and shower.  She continues to be continent of urine and stool continent.  No wandering or behavior issues.  Patient denied any depression but she was very teary as she struggled with her words.  Patient also had a fall on 10/24/2023 per ED and her husband and she was walking with blankets and slipped and fell She did have left fifth metatarsal fracture and a small lateral epicondyle fracture in the elbow.  Patient denied  any dizziness CT of head showed moderate amount of cerebral atrophy CT of the cervical  spine was also negative She was seen by Ortho for her epicondyle fracture.  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   CAD in native artery 06/25/2019   Cataract    unsure of which eye   Colon polyps    Environmental allergies    Essential hypertension 06/25/2019   Genetic testing 04/28/2017   Multi-Cancer panel (83 genes) @ [facility] - No pathogenic mutations detected   GERD (gastroesophageal reflux disease)    Glaucoma    High cholesterol    Hypertension    IBS (irritable bowel syndrome)    Memory change    Osteoporosis 06/2016   T score -2.8   PONV (postoperative nausea and vomiting)    Pure hypercholesterolemia 06/25/2019   Seizures (HCC)    childhood, 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    Past Surgical History:  Procedure Laterality Date   ABDOMINAL HYSTERECTOMY     BLADDER SUSPENSION N/A 04/08/2016   Procedure: TRANSVAGINAL TAPE (TVT) PROCEDURE;  Surgeon: Percilla Burly, MD;  Location: [facility];  Service: Gynecology;  Laterality: N/A;   BREAST SURGERY     cyst removal   CARDIAC CATHETERIZATION     CATARACT EXTRACTION, BILATERAL     COLONOSCOPY     CORONARY STENT INTERVENTION N/A 12/08/2022   Procedure: CORONARY STENT INTERVENTION;  Surgeon: Dann Candyce RAMAN, MD;  Location: [facility] INVASIVE CV LAB;  Service: Cardiovascular;  Laterality: N/A;   CYSTOSCOPY N/A  04/08/2016   Procedure: CYSTOSCOPY;  Surgeon: Marie-Lyne Lavoie, MD;  Location: [facility];  Service: Gynecology;  Laterality: N/A;   DENTAL SURGERY  03/31/2016   ELBOW FRACTURE SURGERY     EYE SURGERY Left    blood clot on retina   LEFT HEART CATH AND CORONARY ANGIOGRAPHY N/A 12/08/2022   Procedure: LEFT HEART CATH AND CORONARY ANGIOGRAPHY;  Surgeon: Dann Candyce RAMAN, MD;  Location: [facility] INVASIVE CV LAB;  Service: Cardiovascular;  Laterality: N/A;   ROBOTIC ASSISTED TOTAL HYSTERECTOMY WITH BILATERAL SALPINGO OOPHERECTOMY Bilateral 04/08/2016   Procedure: ROBOTIC ASSISTED TOTAL HYSTERECTOMY WITH BILATERAL SALPINGO OOPHORECTOMY/Uterosacral Ligament Suspension;  Surgeon: Marie-Lyne  Lavoie, MD;  Location: [facility];  Service: Gynecology;  Laterality: Bilateral;  Request 4 hrs.   TUBAL LIGATION     WRIST ARTHROSCOPY      Allergies  Allergen Reactions   Codeine Other (See Comments)    Knocked her out Other reaction(s): Unknown   Meloxicam Other (See Comments)    BLURRY VISION, DIZZY, CONFUSION  Other reaction(s): Unknown   Sulfamethoxazole-Trimethoprim     Unknown reaction   Tramadol Other (See Comments)    CONFUSION     Outpatient Encounter Medications as of 11/29/2023  Medication Sig   aspirin  81 MG chewable tablet Chew 1 tablet (81 mg total) by mouth daily.   calcium  carbonate (CALCIUM  600) 600 MG TABS tablet Take 600 mg by mouth daily with breakfast.   clopidogrel  (PLAVIX ) 75 MG tablet Take 1 tablet (75 mg total) by mouth daily.   [EXPIRED] COVID-19 mRNA vaccine, Pfizer, 30 MCG/0.3ML injection Inject 0.3 mLs into the muscle once for 1 dose.   donepezil  (ARICEPT ) 10 MG tablet Take 1 tablet (10 mg total) by mouth daily.   ezetimibe  (ZETIA ) 10 MG tablet TAKE ONE TABLET BY MOUTH ONE TIME DAILY   Latanoprost PF 0.005 % SOLN Place 1 drop into both eyes at bedtime.   memantine  (NAMENDA ) 5 MG tablet Take 1 tablet by mouth daily.   metoprolol  succinate (TOPROL -XL) 25 MG 24 hr tablet Take 0.5 tablets (12.5 mg total) by mouth daily. Take with or immediately following a meal.   Multiple Vitamin (MULTIVITAMIN WITH MINERALS) TABS tablet Take 1 tablet by mouth daily.   rosuvastatin  (CRESTOR ) 20 MG tablet Take 1 tablet (20 mg total) by mouth daily.   VALSARTAN  PO Take 20 mg by mouth daily.   alendronate  (FOSAMAX ) 70 MG tablet TAKE 1 TABLET BY MOUTH EVERY 7 DAYS WITH A FULL GLASS OF WATER  ON AN EMPTY STOMACH (Patient not taking: Reported on 11/29/2023)   memantine  (NAMENDA ) 5 MG tablet Take 1 tablet (5 mg total) by mouth 2 (two) times daily. (Patient not taking: Reported on 11/29/2023)   [DISCONTINUED] ezetimibe  (ZETIA ) 10 MG tablet Take 1 tablet (10 mg total) by mouth daily.    Facility-Administered Encounter Medications as of 11/29/2023  Medication   [START ON 03/15/2024] denosumab  (PROLIA ) injection 60 mg    Review of Systems:  Review of Systems  Constitutional:  Negative for activity change and appetite change.  HENT: Negative.    Eyes:  Positive for visual disturbance.  Respiratory:  Negative for cough and shortness of breath.   Cardiovascular:  Negative for leg swelling.  Gastrointestinal:  Negative for constipation.  Genitourinary: Negative.   Musculoskeletal:  Positive for gait problem. Negative for arthralgias and myalgias.  Skin: Negative.   Neurological:  Negative for dizziness and weakness.  Psychiatric/Behavioral:  Positive for confusion and dysphoric mood. Negative for sleep disturbance.  Health Maintenance  Topic Date Due   Medicare Annual Wellness (AWV)  Never done   Influenza Vaccine  01/19/2024 (Originally 10/20/2023)   COVID-19 Vaccine (6 - Pfizer risk 2024-25 season) 01/19/2024 (Originally 11/20/2023)   DTaP/Tdap/Td (3 - Td or Tdap) 07/05/2029   Pneumococcal Vaccine: 50+ Years  Completed   DEXA SCAN  Completed   Zoster Vaccines- Shingrix  Completed   HPV VACCINES  Aged Out   Meningococcal B Vaccine  Aged Out    Physical Exam: Vitals:   11/29/23 1059  BP: 110/74  Pulse: 72  Temp: (!) 97.3 F (36.3 C)  SpO2: 97%  Weight: 130 lb 1.6 oz (59 kg)  Height: 5' 1 (1.549 m)   Body mass index is 24.58 kg/m. Physical Exam Vitals reviewed.  Constitutional:      Appearance: Normal appearance.  HENT:     Head: Normocephalic.     Nose: Nose normal.     Mouth/Throat:     Mouth: Mucous membranes are moist.     Pharynx: Oropharynx is clear.  Eyes:     Pupils: Pupils are equal, round, and reactive to light.  Cardiovascular:     Rate and Rhythm: Normal rate and regular rhythm.     Pulses: Normal pulses.     Heart sounds: Normal heart sounds. No murmur heard. Pulmonary:     Effort: Pulmonary effort is normal.     Breath  sounds: Normal breath sounds.  Abdominal:     General: Abdomen is flat. Bowel sounds are normal.     Palpations: Abdomen is soft.  Musculoskeletal:        General: No swelling.     Cervical back: Neck supple.  Skin:    General: Skin is warm.  Neurological:     General: No focal deficit present.     Mental Status: She is alert.     Comments: More Aphasia Significant change in Cognition  Psychiatric:        Mood and Affect: Mood normal.        Thought Content: Thought content normal.     Labs reviewed: Basic Metabolic Panel: Recent Labs    04/03/23 1558 10/12/23 0810 10/24/23 1339  NA 141 141 140  K 4.8 4.1 4.1  CL 103 108 111  CO2 25 27 20*  GLUCOSE 99 102* 107*  BUN 23 20 16   CREATININE 0.86 0.85 0.68  CALCIUM  9.2 8.9 8.7*  TSH 4.930* 3.99  --    Liver Function Tests: Recent Labs    12/07/22 2152 04/03/23 1558 06/27/23 0822 10/12/23 0810  AST 27 20 20 19   ALT 16 17 16 19   ALKPHOS 41 58 53  --   BILITOT 0.5 <0.2 0.4 0.4  PROT 6.1* 6.9 6.5 6.4  ALBUMIN 3.2* 4.4 4.2  --    Recent Labs    12/07/22 2152  LIPASE 47   No results for input(s): AMMONIA in the last 8760 hours. CBC: Recent Labs    12/07/22 2152 12/08/22 0620 04/03/23 1558 10/12/23 0810 10/24/23 1339  WBC 8.5   < > 5.9 4.6 6.9  NEUTROABS 6.4  --   --  2,755  --   HGB 12.5   < > 13.4 12.7 13.5  HCT 37.7   < > 40.3 39.0 40.7  MCV 97.2   < > 97 96.5 96.7  PLT 288   < > 283 234 254   < > = values in this interval not displayed.   Lipid Panel: Recent  Labs    04/03/23 1558 06/27/23 0822 10/12/23 0810  CHOL  --  136 122  HDL  --  57 59  LDLCALC  --  50 43  TRIG  --  174* 122  CHOLHDL  --  2.4 2.1  LDLDIRECT 82  --   --    No results found for: HGBA1C  Procedures since last visit: No results found.  Assessment/Plan 1. Immunization due (Primary)  - COVID-19 mRNA vaccine, Pfizer, 30 MCG/0.3ML injection; Inject 0.3 mLs into the muscle once for 1 dose.  Dispense: 0.3 mL;  Refill: 0  2. Osteoporosis without current pathological fracture, unspecified osteoporosis type Started Prolia   husband who was little reluctant to continue Prolia  due to her worsening cognition.  I told him that we should continue for now as she is she is ambulatory discuss about stopping if she is in skilled care  DEXA results 4/25 Right Femur is now  -2.7 from -2.2 And Left Femur us  now -3.0 from -2.7 3. Essential hypertension Lose control due to her history of syncope  On Valsartan   Some VT and SVT on Monitor and was restarted on Metoprolol  4. Pure hypercholesterolemia LDL 43 5. 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 She does have appointment with neurology.  she [place] been able to live [place] help  6. Coronary artery disease of native artery of native heart with stable angina pectoris (HCC) On aspirin  Zetia  and statin.    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

## 2023-12-13 ENCOUNTER — Encounter: Payer: Self-pay | Admitting: Internal Medicine

## 2023-12-13 NOTE — Telephone Encounter (Addendum)
 Sending to Gupta, Anjali L, MD to provide clarity as this is a [place] resident at [facility]. FL2 was pended and routed to PCP as well

## 2023-12-21 ENCOUNTER — Emergency Department (HOSPITAL_COMMUNITY)

## 2023-12-21 ENCOUNTER — Encounter (HOSPITAL_COMMUNITY): Payer: Self-pay

## 2023-12-21 ENCOUNTER — Emergency Department (HOSPITAL_COMMUNITY)
Admission: EM | Admit: 2023-12-21 | Discharge: 2023-12-21 | Disposition: A | Discharge status: Home / Self Care | Attending: Emergency Medicine | Admitting: Emergency Medicine

## 2023-12-21 ENCOUNTER — Other Ambulatory Visit: Payer: Self-pay

## 2023-12-21 DIAGNOSIS — Z7902 Long term (current) use of antithrombotics/antiplatelets: Secondary | ICD-10-CM | POA: Diagnosis not present

## 2023-12-21 DIAGNOSIS — R55 Syncope and collapse: Secondary | ICD-10-CM | POA: Insufficient documentation

## 2023-12-21 DIAGNOSIS — Z7982 Long term (current) use of aspirin: Secondary | ICD-10-CM | POA: Insufficient documentation

## 2023-12-21 DIAGNOSIS — I2510 Atherosclerotic heart disease of native coronary artery without angina pectoris: Secondary | ICD-10-CM | POA: Diagnosis not present

## 2023-12-21 DIAGNOSIS — F0390 Unspecified dementia without behavioral disturbance: Secondary | ICD-10-CM | POA: Diagnosis not present

## 2023-12-21 LAB — CBG MONITORING, ED: Glucose-Capillary: 135 mg/dL — ABNORMAL HIGH (ref 70–99)

## 2023-12-21 LAB — CBC WITH DIFFERENTIAL/PLATELET
Abs Immature Granulocytes: 0.01 K/uL (ref 0.00–0.07)
Basophils Absolute: 0 K/uL (ref 0.0–0.1)
Basophils Relative: 0 %
Eosinophils Absolute: 0 K/uL (ref 0.0–0.5)
Eosinophils Relative: 1 %
HCT: 40 % (ref 36.0–46.0)
Hemoglobin: 13 g/dL (ref 12.0–15.0)
Immature Granulocytes: 0 %
Lymphocytes Relative: 13 %
Lymphs Abs: 0.7 K/uL (ref 0.7–4.0)
MCH: 32.1 pg (ref 26.0–34.0)
MCHC: 32.5 g/dL (ref 30.0–36.0)
MCV: 98.8 fL (ref 80.0–100.0)
Monocytes Absolute: 0.3 K/uL (ref 0.1–1.0)
Monocytes Relative: 5 %
Neutro Abs: 4.4 K/uL (ref 1.7–7.7)
Neutrophils Relative %: 81 %
Platelets: 260 K/uL (ref 150–400)
RBC: 4.05 MIL/uL (ref 3.87–5.11)
RDW: 11.9 % (ref 11.5–15.5)
WBC: 5.4 K/uL (ref 4.0–10.5)
nRBC: 0 % (ref 0.0–0.2)

## 2023-12-21 LAB — COMPREHENSIVE METABOLIC PANEL WITH GFR
ALT: 7 U/L (ref 0–44)
AST: 18 U/L (ref 15–41)
Albumin: 3.9 g/dL (ref 3.5–5.0)
Alkaline Phosphatase: 42 U/L (ref 38–126)
Anion gap: 12 (ref 5–15)
BUN: 14 mg/dL (ref 8–23)
CO2: 19 mmol/L — ABNORMAL LOW (ref 22–32)
Calcium: 8.1 mg/dL — ABNORMAL LOW (ref 8.9–10.3)
Chloride: 109 mmol/L (ref 98–111)
Creatinine, Ser: 0.66 mg/dL (ref 0.44–1.00)
GFR, Estimated: >60 mL/min (ref >60)
Glucose, Bld: 118 mg/dL — ABNORMAL HIGH (ref 70–99)
Potassium: 3.5 mmol/L (ref 3.5–5.1)
Sodium: 140 mmol/L (ref 135–145)
Total Bilirubin: 0.4 mg/dL (ref 0.0–1.2)
Total Protein: 6.3 g/dL — ABNORMAL LOW (ref 6.5–8.1)

## 2023-12-21 LAB — RESP PANEL BY RT-PCR (RSV, FLU A&B, COVID)  RVPGX2
Influenza A by PCR: NEGATIVE
Influenza B by PCR: NEGATIVE
Resp Syncytial Virus by PCR: NEGATIVE
SARS Coronavirus 2 by RT PCR: NEGATIVE

## 2023-12-21 LAB — TROPONIN T, HIGH SENSITIVITY: Troponin T High Sensitivity: <15 ng/L (ref 0–19)

## 2023-12-21 NOTE — ED Notes (Signed)
 Patient transported to X-ray

## 2023-12-21 NOTE — ED Triage Notes (Signed)
 Pt BIBA from home, c/o syncope. Witnessed syncopal episode this morning by husband.  Denies falling. Hx of Dementia. A&Ox3 baseline.  22G LH  BP 162/76 HR 55 O2 98 RA CBG 164

## 2023-12-21 NOTE — ED Provider Notes (Signed)
 [place] EMERGENCY DEPARTMENT AT [facility] Provider Note   CSN: [id number] Arrival date & time: 12/21/23  9144     Patient presents with: Loss of Consciousness   Sandra Wheeler is a 83 y.o. female w/ hx of dementia, HLD, CAD, here with  dizziness and lightheadedness.  Supplemental history is provided by the patient's husband, who is with her today, as the patient is level 5 caveat due to dementia.  Her husband reports the patient has a feeling unwell for the past several days.  She has been sleeping more than normal, and she even vomited once during physical therapy on Monday.  However, this morning, the patient did not have anything to eat which was atypical, and they were walking to visit their daughter, and the patient reports that she began to feel sick.  The patient then lowered herself into a sitting position and her husband helped her lay down, but she did not fall and strike her head.  Her husband says she never lost consciousness and she was talking to him throughout this episode.  The patient denies having any active chest pain, lightheadedness or shortness of breath.  Her husband reports the patient had a similar episode of syncope about 1 year ago in August.  She had an extensive cardiac workup subsequently, with results as noted below.  He was told that her heart rate might be too slow.  She continues to be on 12.5 mg metoprolol  daily, no other rate control medications.  She is also on aspirin  and Plavix  for coronary disease.    Echo Sept 2024 with EF 60-65, no severe valvular disease noted  Holter monitor Sept 2024 with 17 episodes SVT lasting up to 12 beats, 2 episodes NSVT up to 8 beats.  Pt on metoprolol  25 mg daily for maintenance for this.  LHC Sept 2024 with PCI to proximal ramus vessel with DES: impression: Successful PCI of the proximal ramus vessel with drug-eluting stent as noted above. Due to tortuosity and moderate, diffuse disease of the distal  left main and ostial LAD, delivering balloons and stents was difficult. 5 [place] guide catheter had to be used due to significant vasospasm and discomfort in the right wrist. Continue dual antiplatelet therapy for at least a year along with aggressive secondary prevention. If she has any bleeding issues, could stop aspirin  and use Brilinta  monotherapy or clopidogrel  monotherapy.    HPI     Prior to Admission medications   Medication Sig Start Date End Date Taking? Authorizing Provider  aspirin  81 MG chewable tablet Chew 1 tablet (81 mg total) by mouth daily. 12/10/22   Vicci Rollo SAUNDERS, PA-C  calcium  carbonate (CALCIUM  600) 600 MG TABS tablet Take 600 mg by mouth daily with breakfast.    [provider]  clopidogrel  (PLAVIX ) 75 MG tablet Take 1 tablet (75 mg total) by mouth daily. 01/25/23   Lonni Slain, MD  donepezil  (ARICEPT ) 10 MG tablet Take 1 tablet (10 mg total) by mouth daily. 04/11/23   Gayland Lauraine PARAS, NP  ezetimibe  (ZETIA ) 10 MG tablet TAKE ONE TABLET BY MOUTH ONE TIME DAILY 09/18/23   Walker, Caitlin S, NP  Latanoprost PF 0.005 % SOLN Place 1 drop into both eyes at bedtime. 11/25/22   [provider]  memantine  (NAMENDA ) 5 MG tablet Take 1 tablet by mouth daily.    [provider]  metoprolol  succinate (TOPROL -XL) 25 MG 24 hr tablet Take 0.5 tablets (12.5 mg total) by mouth daily. Take  with or immediately following a meal. 12/30/22   Vannie Reche RAMAN, NP  Multiple Vitamin (MULTIVITAMIN WITH MINERALS) TABS tablet Take 1 tablet by mouth daily.    [provider]  rosuvastatin  (CRESTOR ) 20 MG tablet Take 1 tablet (20 mg total) by mouth daily. 12/02/23 03/01/24  Charlanne Fredia CROME, MD  VALSARTAN  PO Take 20 mg by mouth daily.    [provider]    Allergies: Codeine, Meloxicam, Sulfamethoxazole-trimethoprim, and Tramadol    Review of Systems  Updated Vital Signs BP (!) 143/67 (BP Location: Left Arm)   Pulse 64   Temp 98.3 F (36.8  C) (Oral)   Resp 15   Ht 5' 1 (1.549 m)   Wt 59 kg   SpO2 98%   BMI 24.58 kg/m   Physical Exam Constitutional:      General: She is not in acute distress. HENT:     Head: Normocephalic and atraumatic.  Eyes:     Conjunctiva/sclera: Conjunctivae normal.     Pupils: Pupils are equal, round, and reactive to light.  Cardiovascular:     Rate and Rhythm: Normal rate and regular rhythm.  Pulmonary:     Effort: Pulmonary effort is normal. No respiratory distress.  Abdominal:     General: There is no distension.     Tenderness: There is no abdominal tenderness.  Skin:    General: Skin is warm and dry.  Neurological:     General: No focal deficit present.     Mental Status: She is alert and oriented to person, place, and time. Mental status is at baseline.  Psychiatric:        Mood and Affect: Mood normal.        Behavior: Behavior normal.     (all labs ordered are listed, but only abnormal results are displayed) Labs Reviewed  COMPREHENSIVE METABOLIC PANEL WITH GFR - Abnormal; Notable for the following components:      Result Value   CO2 19 (*)    Glucose, Bld 118 (*)    Calcium  8.1 (*)    Total Protein 6.3 (*)    All other components within normal limits  CBG MONITORING, ED - Abnormal; Notable for the following components:   Glucose-Capillary 135 (*)    All other components within normal limits  RESP PANEL BY RT-PCR (RSV, FLU A&B, COVID)  RVPGX2  CBC WITH DIFFERENTIAL/PLATELET  TROPONIN T, HIGH SENSITIVITY    EKG: EKG Interpretation Date/Time:  Thursday December 21 2023 09:21:44 EDT Ventricular Rate:  55 PR Interval:  35 QRS Duration:  91 QT Interval:  472 QTC Calculation: 452 R Axis:   51  Text Interpretation: Sinus rhythm Short PR interval Anteroseptal infarct, old Confirmed by Cottie Cough [phone]) on 12/21/2023 9:24:24 AM  Radiology: DG Chest 2 View Result Date: 12/21/2023 CLINICAL DATA:  Syncope. EXAM: CHEST - 2 VIEW COMPARISON:  December 07, 2022.  FINDINGS: The heart size and mediastinal contours are within normal limits. Stable calcified granuloma seen in right upper lobe. No acute pulmonary disease. The visualized skeletal structures are unremarkable. IMPRESSION: No active cardiopulmonary disease. Electronically Signed   By: Lynwood Landy Raddle M.D.   On: 12/21/2023 10:52     Procedures   Medications Ordered in the ED - No data to display  Clinical Course as of 12/21/23 1253  Thu Dec 21, 2023  1251 Patient reassessed and she remains asymptomatic with unremarkable vital signs.  She and her husband are wanting to go home.  I think this  is quite reasonable but did advise close PCP follow-up. [MT]    Clinical Course User Index [MT] Thailan Sava, Donnice PARAS, MD                                 Medical Decision Making Amount and/or Complexity of Data Reviewed Labs: ordered. Radiology: ordered.   This patient presents to the ED with concern for lightheadedness, near syncope. This involves an extensive number of treatment options, and is a complaint that carries with it a high risk of complications and morbidity.  The differential diagnosis includes viral illness including COVID infection versus anemia versus arrhythmia versus atypical ACS versus other  Co-morbidities that complicate the patient evaluation: History of known coronary disease, at risk of cardiovascular complications  Additional history obtained from the patient's husband  External records from outside source obtained and reviewed including cardiology workup from last year as noted above  I ordered and personally interpreted labs.  The pertinent results include: No emergent findings  I ordered imaging studies including x-ray of the chest I independently visualized and interpreted imaging which showed no emergent findings I agree with the radiologist interpretation  The patient was maintained on a cardiac monitor.  I personally viewed and interpreted the cardiac monitored which  showed an underlying rhythm of: Sinus rhythm, borderline mild bradycardia  Per my interpretation the patient's ECG shows normal sinus rhythm no acute ischemia  I have reviewed the patients home medicines and have made adjustments as needed  Test Considered: No tachycardia, hypoxia, risk factors for PE.  Low clinical suspicion for acute PE.  No indication for CT angiogram.  Low suspicion for stroke or CVA.  The patient reports a poor appetite this week but has no abdominal tenderness on exam, no leukocytosis to suggest colitis, appendicitis, other intraabdominal inflammatory condition.  I did not see indication for emergent CT scan of the abdomen at this time.  Doubt UTI clinically.  My overall suspicion is the patient likely has a viral syndrome this week, which caused some nausea earlier this week and excessive fatigue.  This may have caused this near syncopal episode today.  She has had an extensive cardiac workup a year ago in the past, and I do not see evidence at this time of dangerous arrhythmias, ACS, PE, sepsis, or other life-threatening condition.  I think it is reasonable for her to be discharged PCP follow-up next week for recheck of her symptoms.  Disposition:  After consideration of the diagnostic results and the patients response to treatment, I feel that the patent would benefit from close outpatient follow-up.      Final diagnoses:  Near syncope    ED Discharge Orders     None          Cottie Donnice PARAS, MD 12/21/23 1253

## 2023-12-26 NOTE — Patient Instructions (Signed)
 1) Our records indicate you are due for an annual wellness visit, stop at check out to schedule in-person

## 2023-12-27 ENCOUNTER — Non-Acute Institutional Stay: Admitting: Internal Medicine

## 2023-12-27 ENCOUNTER — Encounter: Payer: Self-pay | Admitting: Internal Medicine

## 2023-12-27 VITALS — BP 126/88 | HR 76 | Temp 97.1°F | Resp 18 | Ht 61.0 in | Wt 125.5 lb

## 2023-12-27 DIAGNOSIS — G301 Alzheimer's disease with late onset: Secondary | ICD-10-CM | POA: Diagnosis not present

## 2023-12-27 DIAGNOSIS — R55 Syncope and collapse: Secondary | ICD-10-CM

## 2023-12-27 DIAGNOSIS — R110 Nausea: Secondary | ICD-10-CM

## 2023-12-27 DIAGNOSIS — F02B0 Dementia in other diseases classified elsewhere, moderate, without behavioral disturbance, psychotic disturbance, mood disturbance, and anxiety: Secondary | ICD-10-CM

## 2023-12-27 DIAGNOSIS — M810 Age-related osteoporosis without current pathological fracture: Secondary | ICD-10-CM

## 2023-12-27 NOTE — Progress Notes (Signed)
 Location: [facility] Service:  Clinic (12)  Provider:   Code Status:  Goals of Care:     12/21/2023    9:11 AM  Advanced Directives  Does Patient Have a Medical Advance Directive? Unable to assess, patient is non-responsive or altered mental status     Chief Complaint  Patient presents with   Hospitalization Follow-up    follow up from visit to [facility] ER on 12/21/23    HPI: Patient is a 83 y.o. female seen today for an acute visit for E D follow up  Discussed the use of AI scribe software for clinical note transcription with the patient, who gave verbal consent to proceed.  History of Present Illness   Sandra Wheeler is an 83 year old female who presents with episodes of dizziness and nausea.  On 12/21/2023  she experienced an episode of dizziness and was lowered to the ground by her caregiver. She quickly regained consciousness but was not immediately fully aware.  Went To ED  Work up including Troponin's and CBC and CMP were normal Nausea also occurred on two occasions, once before physical therapy with vomiting during the session, and again before visiting her daughter's house before this episode   No further episodes of nausea or vomiting have occurred.   No further episodes of loss of consciousness have occurred since then. Dizziness is her normal state, and she occasionally experiences nausea,  No dizziness occurred  during therapy sessions since ED visit   Her appetite has decreased, leading to a weight loss of approximately five pounds over a few weeks. She eats little during meals but feels hungry between meals, prompting her caregiver to prepare snacks. She sometimes needs reminders to eat. She denies any recent episodes of dizziness or nausea since the last reported incidents.   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   CAD in native artery 06/25/2019   Cataract    unsure of which eye   Colon polyps    Environmental allergies    Essential hypertension 06/25/2019   Genetic testing 04/28/2017   Multi-Cancer panel (83 genes) @ [facility] - No pathogenic mutations detected   GERD (gastroesophageal reflux disease)    Glaucoma    High cholesterol    Hypertension    IBS (irritable bowel syndrome)    Memory change    Osteoporosis 06/2016   T score -2.8   PONV (postoperative nausea and vomiting)    Pure hypercholesterolemia 06/25/2019   Seizures (HCC)    childhood, 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    Past Surgical History:  Procedure Laterality Date   ABDOMINAL HYSTERECTOMY     BLADDER SUSPENSION N/A 04/08/2016   Procedure: TRANSVAGINAL TAPE (TVT) PROCEDURE;  Surgeon: Percilla Burly, MD;  Location: [facility];  Service: Gynecology;  Laterality: N/A;   BREAST SURGERY     cyst removal   CARDIAC CATHETERIZATION     CATARACT EXTRACTION, BILATERAL     COLONOSCOPY     CORONARY STENT INTERVENTION N/A 12/08/2022   Procedure: CORONARY STENT INTERVENTION;  Surgeon: Dann Candyce RAMAN, MD;  Location:  [facility]ASIVE CV [facility];  Service: Cardiovascular;  Laterality: N/A;   CYSTOSCOPY N/A 04/08/2016   Procedure: CYSTOSCOPY;  Surgeon: Percilla Burly, MD;  Location: [facility];  Service: Gynecology;  Laterality: N/A;   DENTAL SURGERY  03/31/2016   ELBOW FRACTURE SURGERY     EYE SURGERY Left    blood clot on retina   LEFT HEART CATH AND CORONARY ANGIOGRAPHY N/A 12/08/2022   Procedure: LEFT HEART CATH AND CORONARY ANGIOGRAPHY;  Surgeon: Dann Candyce RAMAN, MD;  Location: [facility] INVASIVE CV LAB;  Service: Cardiovascular;  Laterality: N/A;   ROBOTIC ASSISTED TOTAL HYSTERECTOMY WITH BILATERAL  SALPINGO OOPHERECTOMY Bilateral 04/08/2016   Procedure: ROBOTIC ASSISTED TOTAL HYSTERECTOMY WITH BILATERAL SALPINGO OOPHORECTOMY/Uterosacral Ligament Suspension;  Surgeon: Marie-Lyne Lavoie, MD;  Location: [facility];  Service: Gynecology;  Laterality: Bilateral;  Request 4 hrs.   TUBAL LIGATION     WRIST ARTHROSCOPY      Allergies  Allergen Reactions   Codeine Other (See Comments)    Knocked her out Other reaction(s): Unknown   Meloxicam Other (See Comments)    BLURRY VISION, DIZZY, CONFUSION  Other reaction(s): Unknown   Sulfamethoxazole-Trimethoprim     Unknown reaction   Tramadol Other (See Comments)    CONFUSION     Outpatient Encounter Medications as of 12/27/2023  Medication Sig   aspirin  81 MG chewable tablet Chew 1 tablet (81 mg total) by mouth daily.   calcium  carbonate (CALCIUM  600) 600 MG TABS tablet Take 600 mg by mouth daily with breakfast.   clopidogrel  (PLAVIX ) 75 MG tablet Take 1 tablet (75 mg total) by mouth daily.   donepezil  (ARICEPT ) 10 MG tablet Take 1 tablet (10 mg total) by mouth daily.   ezetimibe  (ZETIA ) 10 MG tablet TAKE ONE TABLET BY MOUTH ONE TIME DAILY   Latanoprost PF 0.005 % SOLN Place 1 drop into both eyes at bedtime.   memantine  (NAMENDA ) 5 MG tablet Take 1 tablet by mouth daily.   metoprolol  succinate (TOPROL -XL) 25 MG 24 hr tablet Take 0.5 tablets (12.5 mg total) by mouth daily. Take with or immediately following a meal.   Multiple Vitamin (MULTIVITAMIN WITH MINERALS) TABS tablet Take 1 tablet by mouth daily.   rosuvastatin  (CRESTOR ) 20 MG tablet Take 1 tablet (20 mg total) by mouth daily.   VALSARTAN  PO Take 20 mg by mouth daily.   Facility-Administered Encounter Medications as of 12/27/2023  Medication   [START ON 03/15/2024] denosumab  (PROLIA ) injection 60 mg    Review of Systems:  Review of Systems  Constitutional:  Positive for activity change. Negative for appetite change.  HENT: Negative.    Eyes:  Positive for visual disturbance.   Respiratory:  Negative for cough and shortness of breath.   Cardiovascular:  Negative for leg swelling.  Gastrointestinal:  Negative for constipation.  Genitourinary: Negative.   Musculoskeletal:  Positive for gait problem. Negative for arthralgias and myalgias.  Skin: Negative.   Neurological:  Positive for dizziness. Negative for weakness.  Psychiatric/Behavioral:  Negative for confusion, dysphoric mood and sleep disturbance.     Health Maintenance  Topic Date Due   Medicare Annual Wellness (AWV)  Never done   DTaP/Tdap/Td (3 - Td or Tdap) 07/05/2029   Pneumococcal Vaccine: 50+ Years  Completed   Influenza Vaccine  Completed   DEXA SCAN  Completed   COVID-19 Vaccine  Completed   Zoster Vaccines- Shingrix  Completed   Meningococcal B Vaccine  Aged Out    Physical Exam: Vitals:   12/27/23 0914  BP: 126/88  Pulse: 76  Resp: 18  Temp: (!) 97.1 F (36.2 C)  SpO2: 99%  Weight: 125 lb 8 oz (56.9 kg)  Height: 5' 1 (1.549 m)   Body mass index is 23.71 kg/m. Physical Exam Vitals reviewed.  Constitutional:      Appearance: Normal appearance.  HENT:     Head: Normocephalic.     Nose: Nose normal.     Mouth/Throat:     Mouth: Mucous membranes are moist.     Pharynx: Oropharynx is clear.  Eyes:     Pupils: Pupils are equal, round, and reactive to light.  Cardiovascular:     Rate and Rhythm: Normal rate and regular rhythm.     Pulses: Normal pulses.  Pulmonary:     Effort: Pulmonary effort is normal.     Breath sounds: Normal breath sounds.  Abdominal:     General: Abdomen is flat. Bowel sounds are normal.     Palpations: Abdomen is soft.  Musculoskeletal:        General: No swelling.     Cervical back: Neck supple.  Skin:    General: Skin is warm.  Neurological:     General: No focal deficit present.     Mental Status: She is alert.     Comments: Was able to stand with no assist No Dizziness today  Psychiatric:        Mood and Affect: Mood normal.         Thought Content: Thought content normal.     Labs reviewed: Basic Metabolic Panel: Recent Labs    04/03/23 1558 10/12/23 0810 10/24/23 1339 12/21/23 0915  NA 141 141 140 140  K 4.8 4.1 4.1 3.5  CL 103 108 111 109  CO2 25 27 20* 19*  GLUCOSE 99 102* 107* 118*  BUN 23 20 16 14   CREATININE 0.86 0.85 0.68 0.66  CALCIUM  9.2 8.9 8.7* 8.1*  TSH 4.930* 3.99  --   --    Liver Function Tests: Recent Labs    04/03/23 1558 06/27/23 0822 10/12/23 0810 12/21/23 0915  AST 20 20 19 18   ALT 17 16 19 7   ALKPHOS 58 53  --  42  BILITOT <0.2 0.4 0.4 0.4  PROT 6.9 6.5 6.4 6.3*  ALBUMIN 4.4 4.2  --  3.9   No results for input(s): LIPASE, AMYLASE in the last 8760 hours. No results for input(s): AMMONIA in the last 8760 hours. CBC: Recent Labs    10/12/23 0810 10/24/23 1339 12/21/23 0915  WBC 4.6 6.9 5.4  NEUTROABS 2,755  --  4.4  HGB 12.7 13.5 13.0  HCT 39.0 40.7 40.0  MCV 96.5 96.7 98.8  PLT 234 254 260   Lipid Panel: Recent Labs    04/03/23 1558 06/27/23 0822 10/12/23 0810  CHOL  --  136 122  HDL  --  57 59  LDLCALC  --  50 43  TRIG  --  174* 122  CHOLHDL  --  2.4 2.1  LDLDIRECT 82  --   --    No results found for: HGBA1C  Procedures since last visit: DG Chest 2 View Result Date: 12/21/2023 CLINICAL DATA:  Syncope. EXAM: CHEST - 2 VIEW COMPARISON:  December 07, 2022. FINDINGS: The heart size and mediastinal contours are within normal limits. Stable calcified granuloma seen in right upper lobe. No acute pulmonary disease. The visualized skeletal structures are unremarkable. IMPRESSION: No active cardiopulmonary disease. Electronically Signed   By: Lynwood Landy Mickey CHRISTELLA.D.  On: 12/21/2023 10:52    Assessment/Plan   Assessment and Plan    Near Syncope Episodes can be due to vasovagal events.  Ruled out acute cardiac issues in ED  Chronic dizziness may be exacerbated by nausea or medication side effects. Talked to Therapy to check orthostatic blood pressure  during therapy sessions if dizziness occurs. - Ensure monitoring for recurrence of dizziness and report if persistent.  Nausea and vomiting Can be one time Viral Illness Make sure Aricept  is not the cause - Ensure Aricept  is taken after breakfast to minimize nausea. - Monitor for recurrence of nausea and report if persistent.  Unintentional weight loss Approximately five-pound weight loss over a few weeks. Eating pattern includes small meals with snacks. Weight loss may be temporary but requires close monitoring. - Introduce nutritional supplements like Boost or Ensure to increase caloric intake. - Monitor weight closely and reassess in December.        Labs/tests ordered:  * No order type specified * Next appt:  01/09/2024

## 2024-01-03 NOTE — Addendum Note (Signed)
 Addended by: Yancarlos Berthold LALA on: 01/03/2024 03:09 PM   Modules accepted: Orders

## 2024-01-09 ENCOUNTER — Encounter: Admitting: Family

## 2024-01-22 ENCOUNTER — Other Ambulatory Visit (HOSPITAL_BASED_OUTPATIENT_CLINIC_OR_DEPARTMENT_OTHER): Payer: Self-pay | Admitting: Cardiology

## 2024-01-22 NOTE — Progress Notes (Signed)
   This encounter was created in error - please disregard. No show

## 2024-02-04 ENCOUNTER — Other Ambulatory Visit: Payer: Self-pay | Admitting: Neurology

## 2024-02-05 NOTE — Progress Notes (Signed)
 UPDATE: MUTYH c.925C>T (p.[id number]) amended to likely benign on 02/05/2024.

## 2024-02-05 NOTE — Telephone Encounter (Signed)
 Last note from slack stated: -Restart Namenda  low-dose 5 mg twice daily, if tolerates and benefit we can increase to 10 mg twice daily.   I will mychart pt to see if she is tolerating the 5mg  well and seeing improvement

## 2024-02-16 ENCOUNTER — Encounter: Payer: Self-pay | Admitting: Internal Medicine

## 2024-02-21 ENCOUNTER — Non-Acute Institutional Stay: Payer: Self-pay | Admitting: Internal Medicine

## 2024-02-21 ENCOUNTER — Encounter: Payer: Self-pay | Admitting: Internal Medicine

## 2024-02-21 VITALS — BP 126/62 | HR 91 | Temp 97.1°F | Resp 18 | Ht 61.0 in | Wt 115.6 lb

## 2024-02-21 DIAGNOSIS — G301 Alzheimer's disease with late onset: Secondary | ICD-10-CM

## 2024-02-21 DIAGNOSIS — I25118 Atherosclerotic heart disease of native coronary artery with other forms of angina pectoris: Secondary | ICD-10-CM

## 2024-02-21 DIAGNOSIS — R634 Abnormal weight loss: Secondary | ICD-10-CM

## 2024-02-22 ENCOUNTER — Encounter: Payer: Self-pay | Admitting: Internal Medicine

## 2024-02-22 ENCOUNTER — Telehealth: Payer: Self-pay

## 2024-02-22 NOTE — Telephone Encounter (Signed)
 Copied from CRM #[id number]. Topic: General - Other >> Feb 22, 2024  9:48 AM Mercer PEDLAR wrote: Reason for CRM: Spouse Annisten Manchester is calling regarding a form for adult daycare at [employer] which was dropped off during patient's appointment with Dr. Charlanne on 02/21/24 to be completed and signed. He stated that he stopped by this morning 02/22/24 and was told at the front desk that they don't have it. Mr. Charlie is requesting a callback to update him on status of form because he needs it completed by tomorrow 02/23/24 because he has an appointment at [employer]. He also stated that the form can be faxed to the fax number provided on the form if that is possible.  Callback: [phone]

## 2024-02-22 NOTE — Telephone Encounter (Signed)
 I have left his form. They will call him

## 2024-02-23 NOTE — Progress Notes (Signed)
 Location:  [facility] Service:  Clinic ([id number])  Provider:   Code Status: DNR Goals of Care:     02/11/2024    8:57 PM  Advanced Directives  Does Patient Have a Medical Advance Directive? Unable to assess, patient is non-responsive or altered mental status     Chief Complaint  Patient presents with   Medical Management of Chronic Issues    One month follow-up, needs to scheduled medicare annual wellness visit    HPI: Patient is a 83 y.o. female seen today for medical management of chronic diseases.   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  Per her husband who is with her.  She has stopped taking all her medications.  He has tried to crush them and mix with apple sauce.   she spits it out.    She is needing help with her ADLs.  Constant reminders for dressing.  Is able to feed herself but her appetite is also getting poor.   She has had few incidents of stool incontinence.  Urinary incontinence is already present  She has not had any wandering in the facility.    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   CAD in native artery 06/25/2019   Cataract    unsure of which eye   Colon polyps    Environmental allergies    Essential hypertension 06/25/2019   Genetic testing 04/28/2017   Multi-Cancer panel (83 genes) @ [facility] - No pathogenic mutations detected   GERD (gastroesophageal reflux disease)    Glaucoma    High cholesterol     Hypertension    IBS (irritable bowel syndrome)    Memory change    Osteoporosis 06/2016   T score -2.8   PONV (postoperative nausea and vomiting)    Pure hypercholesterolemia 06/25/2019   Seizures (HCC)    childhood, 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    Past Surgical History:  Procedure Laterality Date   ABDOMINAL HYSTERECTOMY     BLADDER SUSPENSION N/A 04/08/2016   Procedure: TRANSVAGINAL TAPE (TVT) PROCEDURE;  Surgeon: Percilla Burly, MD;  Location: [facility];  Service: Gynecology;  Laterality: N/A;   BREAST SURGERY     cyst removal   CARDIAC CATHETERIZATION     CATARACT EXTRACTION, BILATERAL     COLONOSCOPY     CORONARY STENT INTERVENTION N/A 12/08/2022   Procedure: CORONARY STENT INTERVENTION;  Surgeon: Dann Candyce RAMAN, MD;  Location: [facility];  Service: Cardiovascular;  Laterality: N/A;   CYSTOSCOPY N/A 04/08/2016   Procedure: CYSTOSCOPY;  Surgeon: Percilla Burly, MD;  Location: [facility];  Service: Gynecology;  Laterality: N/A;   DENTAL SURGERY  03/31/2016   ELBOW FRACTURE SURGERY     EYE SURGERY Left    blood clot on retina   LEFT HEART CATH AND CORONARY ANGIOGRAPHY N/A 12/08/2022   Procedure: LEFT HEART CATH AND CORONARY ANGIOGRAPHY;  Surgeon: Dann Candyce RAMAN, MD;  Location: [facility] IN[facility] CV LAB;  Service: Cardiovascular;  Laterality: N/A;   ROBOTIC ASSISTED TOTAL HYSTERECTOMY WITH BILATERAL SALPINGO OOPHERECTOMY Bilateral 04/08/2016   Procedure: ROBOTIC ASSISTED TOTAL HYSTERECTOMY WITH BILATERAL SALPINGO OOPHORECTOMY/Uterosacral Ligament Suspension;  Surgeon: Marie-Lyne Lavoie, MD;  Location: [facility];  Service: Gynecology;  Laterality: Bilateral;  Request 4 hrs.   TUBAL LIGATION     WRIST ARTHROSCOPY      Allergies  Allergen Reactions   Codeine Other (See Comments)    Knocked her out Other reaction(s): Unknown   Meloxicam Other (See Comments)    BLURRY VISION, DIZZY, CONFUSION  Other reaction(s): Unknown   Sulfamethoxazole-Trimethoprim     Unknown  reaction   Tramadol Other (See Comments)    CONFUSION     Outpatient Encounter Medications as of 02/21/2024  Medication Sig   aspirin  81 MG chewable tablet Chew 1 tablet (81 mg total) by mouth daily.   calcium  carbonate (CALCIUM  600) 600 MG TABS tablet Take 600 mg by mouth daily with breakfast.   clopidogrel  (PLAVIX ) 75 MG tablet Take 1 tablet (75 mg total) by mouth daily.   donepezil  (ARICEPT ) 10 MG tablet Take 1 tablet (10 mg total) by mouth daily.   ezetimibe  (ZETIA ) 10 MG tablet TAKE ONE TABLET BY MOUTH ONE TIME DAILY   Latanoprost PF 0.005 % SOLN Place 1 drop into both eyes at bedtime.   memantine  (NAMENDA ) 5 MG tablet Take 1 tablet by mouth daily.   metoprolol  succinate (TOPROL -XL) 25 MG 24 hr tablet Take 0.5 tablets (12.5 mg total) by mouth daily. Take with or immediately following a meal.   Multiple Vitamin (MULTIVITAMIN WITH MINERALS) TABS tablet Take 1 tablet by mouth daily.   rosuvastatin  (CRESTOR ) 20 MG tablet Take 1 tablet (20 mg total) by mouth daily.   VALSARTAN  PO Take 80 mg by mouth daily.   Facility-Administered Encounter Medications as of 02/21/2024  Medication   [START ON 03/15/2024] denosumab  (PROLIA ) injection 60 mg    Review of Systems:  Review of Systems  Unable to perform ROS: Dementia    Health Maintenance  Topic Date Due   Medicare Annual Wellness (AWV)  Never done   COVID-19 Vaccine (9 - Pfizer risk 2025-26 season) 05/29/2024   DTaP/Tdap/Td (3 - Td or Tdap) 07/05/2029   Pneumococcal Vaccine: 50+ Years  Completed   Influenza Vaccine  Completed   Bone Density Scan  Completed   Zoster Vaccines- Shingrix  Completed   Meningococcal B Vaccine  Aged Out    Physical Exam: Vitals:   02/21/24 1322  BP: 126/62  Pulse: 91  Resp: 18  Temp: (!) 97.1 F (36.2 C)  SpO2: 97%  Weight: 115 lb 9.6 oz (52.4 kg)  Height: 5' 1 (1.549 m)   Body mass index is 21.84 kg/m. Physical Exam Vitals reviewed.  Constitutional:      Appearance: Normal appearance.   HENT:     Head: Normocephalic.     Nose: Nose normal.     Mouth/Throat:     Mouth: Mucous membranes are moist.     Pharynx: Oropharynx is clear.  Musculoskeletal:        General: No swelling.     Cervical back: Neck supple.  Skin:    General: Skin is warm and dry.  Neurological:     Mental Status: She is alert.     Comments: Patient has now developed expressive aphasia.  She was able to follow some simple commands.  She is walking  by holding her husband's hands     Labs reviewed: Basic Metabolic Panel: Recent Labs    04/03/23 1558 10/12/23 0810 10/24/23 1339 12/21/23 0915  NA 141 141 140 140  K 4.8 4.1 4.1 3.5  CL 103 108 111 109  CO2 25 27 20* 19*  GLUCOSE 99 102* 107* 118*  BUN 23 20 16 14   CREATININE 0.86 0.85 0.68 0.66  CALCIUM  9.2 8.9 8.7* 8.1*  TSH 4.930* 3.99  --   --    Liver Function Tests: Recent Labs    04/03/23 1558 06/27/23 0822 10/12/23 0810 12/21/23 0915  AST 20 20 19 18   ALT 17 16 19 7   ALKPHOS 58 53  --  42  BILITOT <0.2 0.4 0.4 0.4  PROT 6.9 6.5 6.4 6.3*  ALBUMIN 4.4 4.2  --  3.9   No results for input(s): LIPASE, AMYLASE in the last 8760 hours. No results for input(s): AMMONIA in the last 8760 hours. CBC: Recent Labs    10/12/23 0810 10/24/23 1339 12/21/23 0915  WBC 4.6 6.9 5.4  NEUTROABS 2,755  --  4.4  HGB 12.7 13.5 13.0  HCT 39.0 40.7 40.0  MCV 96.5 96.7 98.8  PLT 234 254 260   Lipid Panel: Recent Labs    04/03/23 1558 06/27/23 0822 10/12/23 0810  CHOL  --  136 122  HDL  --  57 59  LDLCALC  --  50 43  TRIG  --  174* 122  CHOLHDL  --  2.4 2.1  LDLDIRECT 82  --   --    No results found for: HGBA1C  Procedures since last visit: No results found.  Assessment/Plan 1. 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  Her husband is helping her with most of all ADLs except feeding.  She has lost almost 10 pounds in past few weeks with 15 overall.   She he has stopped all her medications. We discussed different options.  He wants her to go to [facility].  We did discuss that it is going to be hard for her to be away from him as she gets very anxious.  But I did fill up the phone for him to try. I am also making a referral to hospice.  He is going on discussed with them and his family Also Signed and gave him DNR form - Ambulatory referral to Hospice  2. Weight loss  - Ambulatory referral to Hospice  3. Coronary artery disease of native artery of native heart with stable angina pectoris Patient has stopped all of her medications as she her dementia is getting worse   Total time spent in this patient care encounter was  45_  minutes; greater than 50% of the visit spent counseling patient and staff, reviewing records , Labs and coordinating care for problems addressed at this encounter.   Labs/tests ordered:  * No order type specified * Next appt:  02/22/2024

## 2024-02-26 ENCOUNTER — Other Ambulatory Visit: Payer: Self-pay | Admitting: Internal Medicine

## 2024-02-26 ENCOUNTER — Telehealth: Payer: Self-pay

## 2024-02-26 MED ORDER — LORAZEPAM 2 MG/ML PO CONC: 0.2000 mg | Freq: Four times a day (QID) | ORAL | 0 refills | Status: DC | PRN

## 2024-02-26 NOTE — Progress Notes (Signed)
 Patient is admitted in health care for respite. Calling in Low dose of Ativan  to help for her to get through without her husband

## 2024-02-26 NOTE — Telephone Encounter (Signed)
 Paper work received ( [facility])for medication review and sign off has been placed in providers box for review ( Tylenol ).

## 2024-02-27 ENCOUNTER — Other Ambulatory Visit: Payer: Self-pay | Admitting: Nurse Practitioner

## 2024-02-27 MED ORDER — LORAZEPAM 2 MG/ML PO CONC: 0.2500 mg | Freq: Four times a day (QID) | ORAL | 0 refills | Status: DC | PRN

## 2024-02-28 ENCOUNTER — Other Ambulatory Visit: Payer: Self-pay | Admitting: Internal Medicine

## 2024-02-28 DIAGNOSIS — G301 Alzheimer's disease with late onset: Secondary | ICD-10-CM

## 2024-02-28 DIAGNOSIS — R634 Abnormal weight loss: Secondary | ICD-10-CM

## 2024-03-10 ENCOUNTER — Emergency Department (HOSPITAL_COMMUNITY)

## 2024-03-10 ENCOUNTER — Emergency Department (HOSPITAL_COMMUNITY)
Admission: EM | Admit: 2024-03-10 | Discharge: 2024-03-10 | Disposition: A | Discharge status: Home / Self Care | Source: Skilled Nursing Facility | Attending: Emergency Medicine | Admitting: Emergency Medicine

## 2024-03-10 ENCOUNTER — Other Ambulatory Visit: Payer: Self-pay

## 2024-03-10 ENCOUNTER — Encounter (HOSPITAL_COMMUNITY): Payer: Self-pay | Admitting: Emergency Medicine

## 2024-03-10 DIAGNOSIS — F03C0 Unspecified dementia, severe, without behavioral disturbance, psychotic disturbance, mood disturbance, and anxiety: Secondary | ICD-10-CM | POA: Diagnosis not present

## 2024-03-10 DIAGNOSIS — Z139 Encounter for screening, unspecified: Secondary | ICD-10-CM | POA: Diagnosis not present

## 2024-03-10 DIAGNOSIS — R3912 Poor urinary stream: Secondary | ICD-10-CM | POA: Diagnosis present

## 2024-03-10 DIAGNOSIS — I2510 Atherosclerotic heart disease of native coronary artery without angina pectoris: Secondary | ICD-10-CM | POA: Insufficient documentation

## 2024-03-10 LAB — CBC WITH DIFFERENTIAL/PLATELET
Abs Immature Granulocytes: 0.01 K/uL (ref 0.00–0.07)
Basophils Absolute: 0 K/uL (ref 0.0–0.1)
Basophils Relative: 0 %
Eosinophils Absolute: 0 K/uL (ref 0.0–0.5)
Eosinophils Relative: 1 %
HCT: 41.7 % (ref 36.0–46.0)
Hemoglobin: 14 g/dL (ref 12.0–15.0)
Immature Granulocytes: 0 %
Lymphocytes Relative: 25 %
Lymphs Abs: 1.1 K/uL (ref 0.7–4.0)
MCH: 32 pg (ref 26.0–34.0)
MCHC: 33.6 g/dL (ref 30.0–36.0)
MCV: 95.2 fL (ref 80.0–100.0)
Monocytes Absolute: 0.3 K/uL (ref 0.1–1.0)
Monocytes Relative: 7 %
Neutro Abs: 3.1 K/uL (ref 1.7–7.7)
Neutrophils Relative %: 67 %
Platelets: 250 K/uL (ref 150–400)
RBC: 4.38 MIL/uL (ref 3.87–5.11)
RDW: 11.9 % (ref 11.5–15.5)
WBC: 4.6 K/uL (ref 4.0–10.5)
nRBC: 0 % (ref 0.0–0.2)

## 2024-03-10 LAB — COMPREHENSIVE METABOLIC PANEL WITH GFR
ALT: 8 U/L (ref 0–44)
AST: 19 U/L (ref 15–41)
Albumin: 3.8 g/dL (ref 3.5–5.0)
Alkaline Phosphatase: 45 U/L (ref 38–126)
Anion gap: 13 (ref 5–15)
BUN: 15 mg/dL (ref 8–23)
CO2: 21 mmol/L — ABNORMAL LOW (ref 22–32)
Calcium: 8.8 mg/dL — ABNORMAL LOW (ref 8.9–10.3)
Chloride: 107 mmol/L (ref 98–111)
Creatinine, Ser: 0.64 mg/dL (ref 0.44–1.00)
GFR, Estimated: >60 mL/min
Glucose, Bld: 100 mg/dL — ABNORMAL HIGH (ref 70–99)
Potassium: 3.7 mmol/L (ref 3.5–5.1)
Sodium: 141 mmol/L (ref 135–145)
Total Bilirubin: 0.7 mg/dL (ref 0.0–1.2)
Total Protein: 6.6 g/dL (ref 6.5–8.1)

## 2024-03-10 LAB — LIPASE, BLOOD: Lipase: 48 U/L (ref 11–51)

## 2024-03-10 LAB — RESP PANEL BY RT-PCR (RSV, FLU A&B, COVID)  RVPGX2
Influenza A by PCR: NEGATIVE
Influenza B by PCR: NEGATIVE
Resp Syncytial Virus by PCR: NEGATIVE
SARS Coronavirus 2 by RT PCR: NEGATIVE

## 2024-03-10 MED ORDER — LORAZEPAM 2 MG/ML IJ SOLN
0.5000 mg | Freq: Once | INTRAMUSCULAR | Status: AC
  Administered 2024-03-10: 0.5 mg via INTRAVENOUS
  Filled 2024-03-10: qty 1

## 2024-03-10 MED ORDER — IOHEXOL 300 MG/ML  SOLN: 80.0000 mL | Freq: Once | INTRAMUSCULAR | Status: DC | PRN

## 2024-03-10 MED ORDER — ACETAMINOPHEN 10 MG/ML IV SOLN
1000.0000 mg | Freq: Once | INTRAVENOUS | Status: AC
  Administered 2024-03-10: 1000 mg via INTRAVENOUS
  Filled 2024-03-10: qty 100

## 2024-03-10 MED ORDER — SODIUM CHLORIDE 0.9 % IV BOLUS
500.0000 mL | Freq: Once | INTRAVENOUS | Status: AC
  Administered 2024-03-10: 500 mL via INTRAVENOUS

## 2024-03-10 MED ORDER — ACETAMINOPHEN 500 MG PO TABS: 1000.0000 mg | ORAL_TABLET | Freq: Once | ORAL | Status: DC

## 2024-03-10 MED ORDER — LORAZEPAM 2 MG/ML PO CONC: 0.5000 mg | Freq: Four times a day (QID) | ORAL | 0 refills | Status: DC | PRN

## 2024-03-10 MED ORDER — FOSFOMYCIN TROMETHAMINE 3 G PO PACK
3.0000 g | PACK | Freq: Once | ORAL | Status: AC
  Administered 2024-03-10: 3 g via ORAL
  Filled 2024-03-10: qty 3

## 2024-03-10 NOTE — ED Provider Notes (Signed)
 "  Emergency Department Provider Note   I have reviewed the triage vital signs and the nursing notes.   HISTORY  Chief Complaint Abdominal Pain and Decreased urinary output   HPI Sandra Wheeler is a 83 y.o. female with past history reviewed below including severe dementia presents to the emergency department with her husband.  [facility].  He is the primary caregiver.  Her dementia has significantly worsened over the past several weeks to months.  Husband states they have been in discussion about hospice/palliative care options.  He states this morning she seemed to be in pain.  She was curled in the fetal position and intermittently tearful and moaning.  She did not want to straighten her legs.  No fever.  He notes some decreased urine output over the past several days as well along with decreased appetite. 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   CAD in native artery 06/25/2019   Cataract    unsure of which eye   Colon polyps    Environmental allergies    Essential hypertension 06/25/2019   Genetic testing 04/28/2017   Multi-Cancer panel (83 genes) @ [facility] - No pathogenic mutations detected   GERD (gastroesophageal reflux disease)    Glaucoma    High cholesterol    Hypertension    IBS (irritable bowel syndrome)    Memory change    Osteoporosis 06/2016   T score -2.8   PONV (postoperative nausea and vomiting)    Pure hypercholesterolemia 06/25/2019   Seizures (HCC)    childhood, 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 Respiratory: Denies shortness of breath. Gastrointestinal: No abdominal pain.  No nausea, no vomiting.  Musculoskeletal: Negative for back pain. Neurological: Negative for  headaches.   ____________________________________________   PHYSICAL EXAM:  VITAL SIGNS: ED Triage Vitals [03/10/24 1028]  Encounter Vitals Group     BP (!) 180/80     Pulse Rate 62     Resp 18     Temp (!) 97.5 F (36.4 C)     Temp Source Oral     SpO2 99 %     Weight 115 lb (52.2 kg)     Height 5' 1 (1.549 m)   Constitutional: Alert. Well appearing and in no acute distress. Eyes: Conjunctivae are normal.  Head: Atraumatic. Nose: No congestion/rhinnorhea. Mouth/Throat: Mucous membranes are moist.   Neck: No stridor.  Cardiovascular: Normal rate, regular rhythm. Good peripheral circulation. Grossly normal heart sounds.   Respiratory: Normal respiratory effort.  No retractions. Lungs CTAB. Gastrointestinal: Soft and nontender. No distention.  Musculoskeletal: No gross deformities of extremities.  Normal range of motion of the bilateral hips and knees. Neurologic:  Normal speech and language. Skin:  Skin is warm, dry and intact. No rash noted.  ____________________________________________   LABS (all labs ordered are listed, but only abnormal results are displayed)  Labs Reviewed  COMPREHENSIVE METABOLIC PANEL WITH GFR - Abnormal; Notable for the following components:      Result Value   CO2 21 (*)    Glucose, Bld 100 (*)    Calcium  8.8 (*)    All other components within normal limits  RESP PANEL BY RT-PCR (RSV, FLU A&B, COVID)  RVPGX2  LIPASE, BLOOD  CBC WITH DIFFERENTIAL/PLATELET  URINALYSIS, W/ REFLEX TO CULTURE (INFECTION SUSPECTED)   ____________________________________________  RADIOLOGY  DG Abdomen Acute W/Chest Result Date: 03/10/2024 EXAM: UPRIGHT AND SUPINE XRAY VIEWS OF THE ABDOMEN AND FRONTAL VIEW OF THE CHEST 03/10/2024 11:29:00 AM COMPARISON: 12/21/2023 CLINICAL HISTORY: abdominal pain FINDINGS: LUNGS AND PLEURA: Calcified right upper lobe granuloma. No consolidation or pulmonary edema. No pleural effusion or pneumothorax. HEART AND MEDIASTINUM:  Aortic atherosclerotic calcification. No acute abnormality of the cardiac and mediastinal silhouettes. BOWEL: A single air-filled loop of small bowel is noted in the left upper quadrant measuring up to 2.3 cm. Paucity of small bowel gas noted Elsewhere. Moderate stool burden noted in the colon. No bowel obstruction. PERITONEUM AND SOFT TISSUES: No signs of pneumoperitoneum or pneumatosis. No abnormal abdominal or pelvic calcifications. No free air. BONES: Multilevel thoracolumbar spondylosis. No acute osseous abnormality. IMPRESSION: 1. Moderate stool burden in the colon. 2. There is a single prominent small bowel loop in the left hemiabdomen measuring up to 2.3 cm, with paucity of small bowel gas noted elsewhere. Findings are nonspecific and could represent focal ileus or enteritis. If there is a clinical concern for bowel obstruction, consider further evaluation with CT of the abdomen and pelvis. Electronically signed by: Waddell Calk MD 03/10/2024 11:57 AM EST RP Workstation: [id number]    ____________________________________________   PROCEDURES  Procedure(s) performed:   Procedures  None  ____________________________________________   INITIAL IMPRESSION / ASSESSMENT AND PLAN / ED COURSE  Pertinent labs & imaging results that were available during my care of the patient were reviewed by me and considered in my medical decision making (see chart for details).   This patient is Presenting for Evaluation of pain, which does require a range of treatment options, and is a complaint that involves a moderate risk of morbidity and mortality.  The Differential Diagnoses include constipation, SBO, acute surgical process in the abdomen, UTI, etc.  Critical Interventions-    Medications  iohexol  (OMNIPAQUE ) 300 MG/ML solution 80 mL (has no administration in time range)  sodium chloride  0.9 % bolus 500 mL (0 mLs Intravenous Stopped 03/10/24 1327)  acetaminophen  (OFIRMEV ) IV 1,000 mg (0 mg  Intravenous Stopped 03/10/24 1330)  LORazepam  (ATIVAN ) injection 0.5 mg (0.5 mg Intravenous Given 03/10/24 1311)  sodium chloride  0.9 % bolus 500 mL (500 mLs Intravenous New Bag/Given 03/10/24 1326)  fosfomycin  (MONUROL ) packet 3 g (3 g Oral Given 03/10/24 1419)    Reassessment after intervention:  agitation improved.    I did obtain Additional Historical Information from husband, as the patient is altered/suffering with dementia.   Clinical Laboratory Tests Ordered, included CBC without leukocytosis or anemia.  LFTs and bilirubin normal.  Lipase normal. COVID negative.   Radiologic Tests Ordered, included DG Abd. I independently interpreted the images and agree with radiology interpretation.   Cardiac Monitor Tracing which shows NSR.    Social Determinants of Health Risk patient is a non-smoker.   Medical Decision Making: Summary:  Patient presents emergency department with apparent pain although the exact nature and location of the symptoms is unknown due to her dementia.  Abdomen is diffusely soft and nontender.  She has normal range of motion of her bilateral upper and lower extremities.  Plan for screening blood work, x-ray of the abdomen to assess stool burden, UA.   Reevaluation with update and discussion with patient's husband at bedside.  We discussed doing In-N-Out catheter for urine and possibly sending for CT based on the x-ray findings.  He states that his main goal is  palliative care at this point.  He would like to avoid CT imaging even understanding that we could be missing potential serious diagnosis.  He states that she seems to be feeling much better after Tylenol  and they would not consider surgery or other advanced treatment options at this point.  We discussed that we do not have a urine sample yet and she was recently just incontinent in the ED.  He requested that we consider treating empirically even though we do not have a sample which typically I would not do but in  this case I think is reasonable with the goal of sending her home.  I will also send home with some oral Ativan  solution.  He states this was prescribed recently but was for [facility] nursing [facility] and he does not have any at home. This has made her more comfortable here in the ED and so this was sent home as well.    Patient's presentation is most consistent with acute, uncomplicated illness.   Disposition: discharge  ____________________________________________  FINAL CLINICAL IMPRESSION(S) / ED DIAGNOSES  Final diagnoses:  Encounter for medical screening examination     Note:  This document was prepared using Dragon voice recognition software and may include unintentional dictation errors.  Fonda Law, MD, [facility] Emergency Medicine    Freeman Borba, Fonda MATSU, MD 03/10/24 1447  "

## 2024-03-10 NOTE — Discharge Instructions (Signed)
 You may continue to give Tylenol  as needed for any apparent discomfort.  I will send a course of the Ativan  to your pharmacy to take as needed for agitation.  Please continue to follow closely with your primary care doctor.

## 2024-03-10 NOTE — ED Triage Notes (Signed)
 Pt bib EMS from home. Pt has dementia husband states pt woke up this morning very restless, grimacing and crying out seemingly in pain. Husband denies falls, no blood thinners but states she has decreased urine output for the last few days. BP 150/70 64 HR CBG 112

## 2024-03-20 ENCOUNTER — Other Ambulatory Visit: Payer: Self-pay | Admitting: Nurse Practitioner

## 2024-03-20 MED ORDER — MORPHINE SULFATE (CONCENTRATE) 20 MG/ML PO SOLN: 2.6000 mg | ORAL | 0 refills | Status: DC

## 2024-03-20 MED ORDER — LORAZEPAM 2 MG/ML PO CONC: 0.5000 mg | ORAL | 0 refills | Status: AC

## 2024-03-25 ENCOUNTER — Other Ambulatory Visit: Payer: Self-pay | Admitting: Nurse Practitioner

## 2024-03-25 MED ORDER — MORPHINE SULFATE (CONCENTRATE) 20 MG/ML PO SOLN: 5.0000 mg | ORAL | 0 refills | Status: AC

## 2024-03-26 ENCOUNTER — Telehealth: Payer: Self-pay | Admitting: *Deleted

## 2024-03-26 NOTE — Telephone Encounter (Signed)
 Verification for Prolia  submitted to [employer] Awaiting Verification Last Injection 10/17/23   Injection Appt 04/23/24 Labs needed before appt. -sent message to Dr. Charlanne. Had labs done in Hospital on 03/10/24 and Calcium  was low at 8.8.

## 2024-03-28 ENCOUNTER — Telehealth: Payer: Self-pay | Admitting: Internal Medicine

## 2024-03-28 NOTE — Telephone Encounter (Signed)
 The funeral home called in to inform Dr Charlanne that there was a part missing in the [employer] System for death certificate. He stated to please click on 33 so that the signature will populate along with date signed. Please advise

## 2024-03-28 NOTE — Telephone Encounter (Signed)
 Patient is deceased.

## 2024-04-23 ENCOUNTER — Ambulatory Visit: Payer: Self-pay

## 2024-05-29 ENCOUNTER — Encounter: Payer: Self-pay | Admitting: Internal Medicine
